# Patient Record
Sex: Male | Born: 1950 | Race: Black or African American | Hispanic: No | Smoking: Current some day smoker
Health system: Southern US, Community
[De-identification: ages and names within clinical notes are randomized; demographics above are authoritative.]

## PROBLEM LIST (undated history)

## (undated) DIAGNOSIS — I63231 Cerebral infarction due to unspecified occlusion or stenosis of right carotid arteries: Secondary | ICD-10-CM

## (undated) DIAGNOSIS — I4891 Unspecified atrial fibrillation: Secondary | ICD-10-CM

## (undated) DIAGNOSIS — Z9289 Personal history of other medical treatment: Secondary | ICD-10-CM

## (undated) DIAGNOSIS — I10 Essential (primary) hypertension: Secondary | ICD-10-CM

## (undated) DIAGNOSIS — Z7901 Long term (current) use of anticoagulants: Secondary | ICD-10-CM

## (undated) DIAGNOSIS — I509 Heart failure, unspecified: Secondary | ICD-10-CM

## (undated) DIAGNOSIS — I428 Other cardiomyopathies: Secondary | ICD-10-CM

## (undated) DIAGNOSIS — K279 Peptic ulcer, site unspecified, unspecified as acute or chronic, without hemorrhage or perforation: Secondary | ICD-10-CM

## (undated) DIAGNOSIS — I639 Cerebral infarction, unspecified: Secondary | ICD-10-CM

## (undated) DIAGNOSIS — E785 Hyperlipidemia, unspecified: Secondary | ICD-10-CM

## (undated) DIAGNOSIS — I63411 Cerebral infarction due to embolism of right middle cerebral artery: Secondary | ICD-10-CM

## (undated) HISTORY — DX: Personal history of other medical treatment: Z92.89

## (undated) HISTORY — DX: Long term (current) use of anticoagulants: Z79.01

## (undated) HISTORY — PX: REPAIR OF PERFORATED ULCER: SHX6065

## (undated) HISTORY — DX: Heart failure, unspecified: I50.9

## (undated) HISTORY — DX: Cerebral infarction, unspecified: I63.9

## (undated) HISTORY — DX: Cerebral infarction due to embolism of right middle cerebral artery: I63.411

## (undated) HISTORY — DX: Other cardiomyopathies: I42.8

## (undated) HISTORY — DX: Hyperlipidemia, unspecified: E78.5

## (undated) HISTORY — DX: Unspecified atrial fibrillation: I48.91

## (undated) HISTORY — DX: Cerebral infarction due to unspecified occlusion or stenosis of right carotid arteries: I63.231

---

## 1986-02-18 DIAGNOSIS — K279 Peptic ulcer, site unspecified, unspecified as acute or chronic, without hemorrhage or perforation: Secondary | ICD-10-CM

## 1986-02-18 HISTORY — DX: Peptic ulcer, site unspecified, unspecified as acute or chronic, without hemorrhage or perforation: K27.9

## 2014-07-20 DIAGNOSIS — I4891 Unspecified atrial fibrillation: Secondary | ICD-10-CM

## 2014-07-20 HISTORY — DX: Unspecified atrial fibrillation: I48.91

## 2014-08-10 ENCOUNTER — Emergency Department (HOSPITAL_COMMUNITY)
Admission: EM | Admit: 2014-08-10 | Discharge: 2014-08-10 | Disposition: A | Discharge status: Home / Self Care | Payer: Self-pay | Attending: Emergency Medicine | Admitting: Emergency Medicine

## 2014-08-10 ENCOUNTER — Encounter (HOSPITAL_COMMUNITY): Payer: Self-pay

## 2014-08-10 DIAGNOSIS — R404 Transient alteration of awareness: Secondary | ICD-10-CM

## 2014-08-10 DIAGNOSIS — Z720 Tobacco use: Secondary | ICD-10-CM | POA: Insufficient documentation

## 2014-08-10 DIAGNOSIS — Z79899 Other long term (current) drug therapy: Secondary | ICD-10-CM | POA: Insufficient documentation

## 2014-08-10 DIAGNOSIS — Z8711 Personal history of peptic ulcer disease: Secondary | ICD-10-CM | POA: Insufficient documentation

## 2014-08-10 DIAGNOSIS — I4891 Unspecified atrial fibrillation: Secondary | ICD-10-CM

## 2014-08-10 HISTORY — DX: Peptic ulcer, site unspecified, unspecified as acute or chronic, without hemorrhage or perforation: K27.9

## 2014-08-10 LAB — CBC WITH DIFFERENTIAL/PLATELET
Basophils Absolute: 0 10*3/uL (ref 0.0–0.1)
Basophils Relative: 0 % (ref 0–1)
EOS ABS: 0 10*3/uL (ref 0.0–0.7)
EOS PCT: 0 % (ref 0–5)
HCT: 44.8 % (ref 39.0–52.0)
HEMOGLOBIN: 15.3 g/dL (ref 13.0–17.0)
Lymphocytes Relative: 29 % (ref 12–46)
Lymphs Abs: 1.5 10*3/uL (ref 0.7–4.0)
MCH: 29.7 pg (ref 26.0–34.0)
MCHC: 34.2 g/dL (ref 30.0–36.0)
MCV: 87 fL (ref 78.0–100.0)
MONO ABS: 0.2 10*3/uL (ref 0.1–1.0)
Monocytes Relative: 5 % (ref 3–12)
Neutro Abs: 3.5 10*3/uL (ref 1.7–7.7)
Neutrophils Relative %: 66 % (ref 43–77)
Platelets: 141 10*3/uL — ABNORMAL LOW (ref 150–400)
RBC: 5.15 MIL/uL (ref 4.22–5.81)
RDW: 14.6 % (ref 11.5–15.5)
WBC: 5.2 10*3/uL (ref 4.0–10.5)

## 2014-08-10 LAB — COMPREHENSIVE METABOLIC PANEL
ALT: 13 U/L — ABNORMAL LOW (ref 17–63)
AST: 20 U/L (ref 15–41)
Albumin: 3.3 g/dL — ABNORMAL LOW (ref 3.5–5.0)
Alkaline Phosphatase: 81 U/L (ref 38–126)
Anion gap: 9 (ref 5–15)
BUN: 17 mg/dL (ref 6–20)
CHLORIDE: 107 mmol/L (ref 101–111)
CO2: 21 mmol/L — ABNORMAL LOW (ref 22–32)
CREATININE: 1.54 mg/dL — AB (ref 0.61–1.24)
Calcium: 8.5 mg/dL — ABNORMAL LOW (ref 8.9–10.3)
GFR calc Af Amer: 54 mL/min — ABNORMAL LOW (ref >60)
GFR calc non Af Amer: 47 mL/min — ABNORMAL LOW (ref >60)
GLUCOSE: 99 mg/dL (ref 65–99)
Potassium: 4.4 mmol/L (ref 3.5–5.1)
Sodium: 137 mmol/L (ref 135–145)
Total Bilirubin: 0.6 mg/dL (ref 0.3–1.2)
Total Protein: 6.4 g/dL — ABNORMAL LOW (ref 6.5–8.1)

## 2014-08-10 LAB — ETHANOL: Alcohol, Ethyl (B): 72 mg/dL — ABNORMAL HIGH (ref <5)

## 2014-08-10 LAB — CBG MONITORING, ED: Glucose-Capillary: 114 mg/dL — ABNORMAL HIGH (ref 65–99)

## 2014-08-10 MED ORDER — ASPIRIN 81 MG PO CHEW
324.0000 mg | CHEWABLE_TABLET | Freq: Every day | ORAL | Status: DC
Start: 2014-08-10 — End: 2014-09-10

## 2014-08-10 MED ORDER — ASPIRIN 81 MG PO CHEW
324.0000 mg | CHEWABLE_TABLET | Freq: Once | ORAL | Status: AC
  Administered 2014-08-10: 324 mg via ORAL
  Filled 2014-08-10: qty 4

## 2014-08-10 MED ORDER — SODIUM CHLORIDE 0.9 % IV BOLUS (SEPSIS)
1000.0000 mL | Freq: Once | INTRAVENOUS | Status: AC
  Administered 2014-08-10: 1000 mL via INTRAVENOUS

## 2014-08-10 NOTE — Discharge Instructions (Signed)
As discussed, it is important that you follow up as soon as possible with your physician for continued management of your condition.  If you develop any new, or concerning changes in your condition, please return to the emergency department immediately.    Atrial Fibrillation Atrial fibrillation is a condition that causes your heart to beat irregularly. It may also cause your heart to beat faster than normal. Atrial fibrillation can prevent your heart from pumping blood normally. It increases your risk of stroke and heart problems. HOME CARE  Take medications as told by your doctor.  Only take medications that your doctor says are safe. Some medications can make the condition worse or happen again.  If blood thinners were prescribed by your doctor, take them exactly as told. Too much can cause bleeding. Too little and you will not have the needed protection against stroke and other problems.  Perform blood tests at home if told by your doctor.  Perform blood tests exactly as told by your doctor.  Do not drink alcohol.  Do not drink beverages with caffeine such as coffee, soda, and some teas.  Maintain a healthy weight.  Do not use diet pills unless your doctor says they are safe. They may make heart problems worse.  Follow diet instructions as told by your doctor.  Exercise regularly as told by your doctor.  Keep all follow-up appointments. GET HELP IF:  You notice a change in the speed, rhythm, or strength of your heartbeat.  You suddenly begin peeing (urinating) more often.  You get tired more easily when moving or exercising. GET HELP RIGHT AWAY IF:   You have chest or belly (abdominal) pain.  You feel sick to your stomach (nauseous).  You are short of breath.  You suddenly have swollen feet and ankles.  You feel dizzy.  You face, arms, or legs feel numb or weak.  There is a change in your vision or speech. MAKE SURE YOU:   Understand these  instructions.  Will watch your condition.  Will get help right away if you are not doing well or get worse. Document Released: 11/14/2007 Document Revised: 06/21/2013 Document Reviewed: 03/17/2012 [facility] Patient Information 2015 [place], [place]. This information is not intended to replace advice given to you by your health care provider. Make sure you discuss any questions you have with your health care provider.

## 2014-08-10 NOTE — ED Notes (Signed)
[facility]- pt coming from home with altered mental status. Pt reportedly consumed 2 beers and smoked weed. Pt found to be very hypotensive on EMS arrival. No radial pulses present. Pt alert and pleasant on arrival but unable to answer simple questions correctly. Vitals stable at this time.

## 2014-08-10 NOTE — ED Provider Notes (Signed)
CSN: [id number]     Arrival date & time 08/10/14  1722 History   First MD Initiated Contact with Patient 08/10/14 1720     Chief Complaint  Patient presents with  . Altered Mental Status     HPI  Patient presents from home after an episode of altered mental status, possible syncope. Patient acknowledges drinking alcohol, smoking marijuana, then having episode of decreased interactivity. EMS reports that the patient was hypotensive on arrival, but interacting appropriately. Patient denies pain, current lightheadedness, nausea, chest pain. He states that he is generally well, though he smokes cigarettes. He has no history of coronary disease. He was well prior to the episode, has no similar prior episodes.    Past Medical History  Diagnosis Date  . Peptic ulcer    Past Surgical History  Procedure Laterality Date  . Repair of perforated ulcer     History reviewed. No pertinent family history. History  Substance Use Topics  . Smoking status: Current Every Day Smoker -- 0.30 packs/day  . Smokeless tobacco: Not on file  . Alcohol Use: 7.2 oz/week    12 Cans of beer per week    Review of Systems  Constitutional:       Per HPI, otherwise negative  HENT:       Per HPI, otherwise negative  Respiratory:       Per HPI, otherwise negative  Cardiovascular:       Per HPI, otherwise negative  Gastrointestinal: Negative for vomiting.  Endocrine:       Negative aside from HPI  Genitourinary:       Neg aside from HPI   Musculoskeletal:       Per HPI, otherwise negative  Skin: Negative.   Neurological: Positive for syncope.      Allergies  Review of patient's allergies indicates no known allergies.  Home Medications   Prior to Admission medications   Medication Sig Start Date End Date Taking? Authorizing Provider  Aspirin-Salicylamide-Caffeine (BC HEADACHE POWDER PO) Take 1 Package by mouth daily as needed (pain).   Yes Historical Provider, MD  Multiple  Vitamins-Minerals (MULTIVITAMIN & MINERAL PO) Take 1 tablet by mouth daily.   Yes Historical Provider, MD  aspirin 81 MG chewable tablet Chew 4 tablets (324 mg total) by mouth daily. 08/10/14   Gerhard Munch, MD   BP 132/94 mmHg  Pulse 91  Temp(Src) 97.7 F (36.5 C) (Oral)  Resp 14  Ht 5\' 8"  (1.727 m)  Wt 162 lb 4.8 oz (73.619 kg)  BMI 24.68 kg/m2  SpO2 100% Physical Exam  Constitutional: He is oriented to person, place, and time. He appears well-developed. No distress.  HENT:  Head: Normocephalic and atraumatic.  Eyes: Conjunctivae and EOM are normal.  Cardiovascular: An irregularly irregular rhythm present.  Pulmonary/Chest: Effort normal. No stridor. No respiratory distress.  Abdominal: He exhibits no distension.  Musculoskeletal: He exhibits no edema.  Neurological: He is alert and oriented to person, place, and time.  Skin: Skin is warm and dry.  Psychiatric: He has a normal mood and affect.  Nursing note and vitals reviewed.   ED Course  Procedures (including critical care time) Labs Review Labs Reviewed  COMPREHENSIVE METABOLIC PANEL - Abnormal; Notable for the following:    CO2 21 (*)    Creatinine, Ser 1.54 (*)    Calcium 8.5 (*)    Total Protein 6.4 (*)    Albumin 3.3 (*)    ALT 13 (*)    GFR calc non Af  Amer 47 (*)    GFR calc Af Amer 54 (*)    All other components within normal limits  CBC WITH DIFFERENTIAL/PLATELET - Abnormal; Notable for the following:    Platelets 141 (*)    All other components within normal limits  ETHANOL - Abnormal; Notable for the following:    Alcohol, Ethyl (B) 72 (*)    All other components within normal limits  CBG MONITORING, ED - Abnormal; Notable for the following:    Glucose-Capillary 114 (*)    All other components within normal limits  URINALYSIS, ROUTINE W REFLEX MICROSCOPIC (NOT AT [facility])  URINE RAPID DRUG SCREEN, HOSP PERFORMED      EKG Interpretation   Date/Time:  Wednesday August 10 2014 17:26:20  EDT Ventricular Rate:  90 PR Interval:    QRS Duration: 104 QT Interval:  369 QTC Calculation: 451 R Axis:   5 Text Interpretation:  Atrial fibrillation Abnormal R-wave progression,  early transition Abnrm T, consider ischemia, anterolateral lds Minimal ST  elevation, anterior leads Atrial fibrillation Left ventricular hypertrophy  T wave abnormality Abnormal ekg Confirmed by Gerhard Munch  MD [phone])  on 08/10/2014 7:15:53 PM     On repeat exam the patient appears well. A family members now present. He states that the patient had, as described, an episode of decreased interactivity for several moments, without trauma, and with full return to interactivity.  The patient had a lengthy conversation about new atrial fibrillation, the need to follow-up closely with our cardiology colleagues tomorrow, need to take aspirin empirically for stroke prevention pending cardiology follow-up. Patient has remained with no tachycardia, but with persistent atrial fibrillation all in the emergency department.  MDM   Final diagnoses:  Transient alteration of awareness  Atrial fibrillation, new onset   Patient presents after an episode of likely syncope, possible decreased interactivity. Here patient is awake, alert, answering questions appropriately, in no distress. Number, the patient was using both marijuana and alcohol prior to the episode, and each of these may have precipitated the episode of atrial fibrillation. Patient has no history of arrhythmia. Patient's labs today otherwise are reassuring, aside from mild borderline kidney function. After hours of monitoring, with no decompensation, no return event, patient was discharged in stable condition with next day cardiology follow-up after initiation of antiplatelets therapy.  Gerhard Munch, MD 08/10/14 [phone]

## 2014-08-10 NOTE — ED Notes (Signed)
OK to DC without collection of UA or UDS.

## 2014-08-11 ENCOUNTER — Ambulatory Visit (HOSPITAL_COMMUNITY)
Admission: RE | Admit: 2014-08-11 | Discharge: 2014-08-11 | Disposition: A | Discharge status: Home / Self Care | Payer: Self-pay | Source: Ambulatory Visit | Attending: Nurse Practitioner | Admitting: Nurse Practitioner

## 2014-08-11 ENCOUNTER — Encounter (HOSPITAL_COMMUNITY): Payer: Self-pay | Admitting: Nurse Practitioner

## 2014-08-11 VITALS — BP 160/100 | HR 98 | Ht 68.0 in | Wt 163.0 lb

## 2014-08-11 DIAGNOSIS — F17210 Nicotine dependence, cigarettes, uncomplicated: Secondary | ICD-10-CM | POA: Insufficient documentation

## 2014-08-11 DIAGNOSIS — I4510 Unspecified right bundle-branch block: Secondary | ICD-10-CM | POA: Insufficient documentation

## 2014-08-11 DIAGNOSIS — I10 Essential (primary) hypertension: Secondary | ICD-10-CM | POA: Insufficient documentation

## 2014-08-11 DIAGNOSIS — I4891 Unspecified atrial fibrillation: Secondary | ICD-10-CM | POA: Insufficient documentation

## 2014-08-11 DIAGNOSIS — Z7982 Long term (current) use of aspirin: Secondary | ICD-10-CM | POA: Insufficient documentation

## 2014-08-11 DIAGNOSIS — I480 Paroxysmal atrial fibrillation: Secondary | ICD-10-CM

## 2014-08-11 NOTE — Progress Notes (Signed)
Patient ID: Jeffrey Martin, male   DOB: [birth year], 64 y.o.   MRN: [id number]  Primary Care Physician: No PCP Per Patient Referring Physician:[facility]   Jeffrey Martin is a 64 y.o. male with a h/o [facility] ER visit last night after episode of altered mental status possibly contributed to  by alcohol use and smoking marijuana. He was found to be in rate controlled, presumably  new onset  afib. He was started on baby ASA and referred here for f/u after no significant abnormal findings and return  of mental status to normal. Chadsvasc score is 0-1 if based on today's high BP reading.  EKG today shows rate controlled afib. Pt is asymptomatic. He is not well known to the medical community having last gone to the doctor one year ago. BP was acceptable  in the ER last pm at 132/94, but it is high today. Repeat BP here 160/100. He states BP usually is not an issue, he is anxious today. He states he only drinks a few beers a week but ER note states pt drinks around 12 beers a week. He does also smoke but does not consume large amounts of caffeine. He denies smoking marijuana. Denies a snoring history. Denies ever having an echocardiogram.  Today, he denies symptoms of palpitations, chest pain, shortness of breath, orthopnea, PND, lower extremity edema, dizziness, presyncope, syncope, or neurologic sequela. The patient is tolerating medications without difficulties and is otherwise without complaint today.   Past Medical History  Diagnosis Date  . Peptic ulcer    Past Surgical History  Procedure Laterality Date  . Repair of perforated ulcer      Current Outpatient Prescriptions  Medication Sig Dispense Refill  . aspirin 81 MG chewable tablet Chew 4 tablets (324 mg total) by mouth daily. 120 tablet 0  . Aspirin-Salicylamide-Caffeine (BC HEADACHE POWDER PO) Take 1 Package by mouth daily as needed (pain).    . Multiple Vitamins-Minerals (MULTIVITAMIN & MINERAL PO) Take 1 tablet by mouth daily.     No  current facility-administered medications for this encounter.    No Known Allergies  History   Social History  . Marital Status: Legally Separated    Spouse Name: N/A  . Number of Children: N/A  . Years of Education: N/A   Occupational History  . Not on file.   Social History Main Topics  . Smoking status: Current Every Day Smoker -- 0.30 packs/day  . Smokeless tobacco: Not on file  . Alcohol Use: 7.2 oz/week    12 Cans of beer per week  . Drug Use: Yes    Special: Marijuana  . Sexual Activity: Not on file   Other Topics Concern  . Not on file   Social History Narrative    No family history on file.  ROS- All systems are reviewed and negative except as per the HPI above  Physical Exam: Filed Vitals:   08/11/14 1339 08/11/14 1514  BP: 188/120 160/100  Pulse: 98   Height: 5\' 8"  (1.727 m)   Weight: 163 lb (73.936 kg)     GEN- The patient is well appearing, alert and oriented x 3 today.   Head- normocephalic, atraumatic Eyes-  Sclera clear, conjunctiva pink Ears- hearing intact Oropharynx- clear Neck- supple, no JVP Lymph- no cervical lymphadenopathy Lungs- Clear to ausculation bilaterally, normal work of breathing Heart- Regular rate and rhythm, no murmurs, rubs or gallops, PMI not laterally displaced GI- soft, NT, ND, + BS Extremities- no clubbing, cyanosis, or edema MS-  no significant deformity or atrophy Skin- no rash or lesion Psych- euthymic mood, full affect Neuro- strength and sensation are intact  EKG- Afib with premature or aberrantly conducted complexes PR int 98 bpm,QRS int 104 ms, Qtc 457 ms  Epic records reviewed.   Assessment and Plan:  1. New onset asymptomatic afib, rate controlled  No rate control needed for now Chadsvasc score of 0-1 Continue ASA. Echo to be scheduled for Monday  2. Elevated BP today Pt states he has never had the dx of HTN, but he has had  sporadic  Medical care Will continue to monitor BP in subsequent  visits and start med as needed Avoid salt  3. Lifestyle issues contributing to afib burden Decrease /stop smoking  Decrease/stop alcohol consumption  F/u after echo for further treatment.  Consider holter monitor or return visit. Get established with general cardiology long term

## 2014-08-11 NOTE — Patient Instructions (Addendum)
Your physician has requested that you have an echocardiogram. Echocardiography is a painless test that uses sound waves to create images of your heart. It provides your doctor with information about the size and shape of your heart and how well your heart's chambers and valves are working. This procedure takes approximately one hour. There are no restrictions for this procedure.  Parking code [id number] for echo  Follow up appointment with Rudi Coco, NP parking code is [id number]

## 2014-08-15 ENCOUNTER — Ambulatory Visit (HOSPITAL_COMMUNITY)
Admission: RE | Admit: 2014-08-15 | Discharge: 2014-08-15 | Disposition: A | Discharge status: Home / Self Care | Payer: Self-pay | Source: Ambulatory Visit | Attending: Nurse Practitioner | Admitting: Nurse Practitioner

## 2014-08-15 DIAGNOSIS — I480 Paroxysmal atrial fibrillation: Secondary | ICD-10-CM

## 2014-08-15 DIAGNOSIS — I4891 Unspecified atrial fibrillation: Secondary | ICD-10-CM | POA: Insufficient documentation

## 2014-08-15 NOTE — Progress Notes (Signed)
  Echocardiogram 2D Echocardiogram has been performed.  Arvil Chaco 08/15/2014, 3:22 PM

## 2014-08-25 ENCOUNTER — Other Ambulatory Visit (HOSPITAL_COMMUNITY): Payer: Self-pay | Admitting: *Deleted

## 2014-08-25 ENCOUNTER — Ambulatory Visit (HOSPITAL_COMMUNITY)
Admission: RE | Admit: 2014-08-25 | Discharge: 2014-08-25 | Disposition: A | Discharge status: Home / Self Care | Payer: Medicaid Other | Source: Ambulatory Visit | Attending: Nurse Practitioner | Admitting: Nurse Practitioner

## 2014-08-25 ENCOUNTER — Encounter (HOSPITAL_COMMUNITY): Payer: Self-pay | Admitting: Nurse Practitioner

## 2014-08-25 VITALS — BP 138/80 | HR 105 | Ht 68.0 in | Wt 156.2 lb

## 2014-08-25 DIAGNOSIS — I480 Paroxysmal atrial fibrillation: Secondary | ICD-10-CM | POA: Diagnosis not present

## 2014-08-25 DIAGNOSIS — I4510 Unspecified right bundle-branch block: Secondary | ICD-10-CM | POA: Diagnosis not present

## 2014-08-25 DIAGNOSIS — I4819 Other persistent atrial fibrillation: Secondary | ICD-10-CM

## 2014-08-25 DIAGNOSIS — I481 Persistent atrial fibrillation: Secondary | ICD-10-CM | POA: Diagnosis not present

## 2014-08-25 DIAGNOSIS — I4891 Unspecified atrial fibrillation: Secondary | ICD-10-CM | POA: Diagnosis present

## 2014-08-25 MED ORDER — METOPROLOL SUCCINATE ER 25 MG PO TB24: 25.0000 mg | ORAL_TABLET | Freq: Every day | ORAL | Status: DC

## 2014-08-25 NOTE — Patient Instructions (Signed)
Your physician has recommended you make the following change in your medication:  1)Metoprolol ER (toprol) 25mg  once a day   I will call you regarding appointment to [facility] wellness center to establish care with them.  Their number is [phone]

## 2014-08-25 NOTE — Progress Notes (Signed)
Patient ID: Jeffrey Martin, male   DOB: November 11, 1950, 64 y.o.   MRN: [id number]  Primary Care Physician: No PCP Per Patient Referring Physician:[facility]   Josue Falconi is a 64 y.o. male with a h/o [facility] ER 6/22 after episode of altered mental status possibly contributed to  by alcohol use and smoking marijuana. He was found to be in rate controlled, presumably  new onset  afib. He was started on baby ASA and referred here for f/u after no significant abnormal findings and return  of mental status to normal. Chadsvasc score is 0-1 if based on today's high BP reading.  EKG today shows rate controlled afib. Pt is asymptomatic. He is not well known to the medical community having last gone to the doctor one year ago. BP was acceptable  in the ER last pm at 132/94.  He states BP usually is not an issue.  He states he only drinks a few beers a week but ER note states pt drinks around 12 beers a week. He does also smoke but does not consume large amounts of caffeine. He denies smoking marijuana,but per ER record he does use marijuana. Denies a snoring history. Denies ever having a cardiac issue or work up.  An echo was ordered when last seen in the afib clinic, and he is back for f/u. It did show moderate LV dysfunction with an EF of 35-40%. He is in afib at 105 bpm,but he feels well and is not aware of palpitations. States he walks 1/2 to 1 mile to the bus stop daily and can do so with good energy and no dyspnea. Has had no further presyncopal episodes. He currently does not have a job or insurance but does have an appointment on Monday to see if he qualifies for medicaid.  Today, he denies symptoms of palpitations, chest pain, shortness of breath, orthopnea, PND, lower extremity edema, dizziness, presyncope, syncope, or neurologic sequela. The patient is tolerating medications without difficulties and is otherwise without complaint today.   Past Medical History  Diagnosis Date  . Peptic ulcer    Past  Surgical History  Procedure Laterality Date  . Repair of perforated ulcer      Current Outpatient Prescriptions  Medication Sig Dispense Refill  . aspirin 81 MG chewable tablet Chew 4 tablets (324 mg total) by mouth daily. 120 tablet 0  . Aspirin-Salicylamide-Caffeine (BC HEADACHE POWDER PO) Take 1 Package by mouth daily as needed (pain).    . Multiple Vitamins-Minerals (MULTIVITAMIN & MINERAL PO) Take 1 tablet by mouth daily.    . metoprolol succinate (TOPROL-XL) 25 MG 24 hr tablet Take 1 tablet (25 mg total) by mouth daily. 30 tablet 6   No current facility-administered medications for this encounter.    No Known Allergies  History   Social History  . Marital Status: Legally Separated    Spouse Name: N/A  . Number of Children: N/A  . Years of Education: N/A   Occupational History  . Not on file.   Social History Main Topics  . Smoking status: Current Every Day Smoker -- 0.30 packs/day  . Smokeless tobacco: Not on file  . Alcohol Use: 7.2 oz/week    12 Cans of beer per week  . Drug Use: Yes    Special: Marijuana  . Sexual Activity: Not on file   Other Topics Concern  . Not on file   Social History Narrative    No family history on file.  ROS- All systems are reviewed  and negative except as per the HPI above  Physical Exam: Filed Vitals:   08/25/14 1309 08/25/14 1421  BP: 140/98 138/80  Pulse: 105   Height: 5\' 8"  (1.727 m)   Weight: 156 lb 3.2 oz (70.852 kg)     GEN- The patient is well appearing, alert and oriented x 3 today.   Head- normocephalic, atraumatic Eyes-  Sclera clear, conjunctiva pink Ears- hearing intact Oropharynx- clear Neck- supple, no JVP Lymph- no cervical lymphadenopathy Lungs- Clear to ausculation bilaterally, normal work of breathing Heart- Irregular rate and rhythm, no murmurs, rubs or gallops, PMI not laterally displaced GI- soft, NT, ND, + BS Extremities- no clubbing, cyanosis, or edema MS- no significant deformity or  atrophy Skin- no rash or lesion Psych- euthymic mood, full affect Neuro- strength and sensation are intact  EKG- Afib with RVR at 105 bpm, IRBBB,  QRS int 102 ms, Qtc 436 ms  Epic records reviewed.   Assessment and Plan:  1.  Asymptomatic afib, v rate today elevated at 105 bpm Chadsvasc score of 0-1 Continue ASA. Start metoprolol ER 25 mg a day for rate control  2. BP Normal on repeat check  3. Lifestyle issues contributing to afib burden Decrease /stop smoking  Decrease/stop alcohol consumption  4. LV dysfunction Start metoprolol ER 25 mg a day Consider staring ace in near future Consider stress test in the future if he can get assitance. cuurently no chest pain or dyspnea  5. Will try to refer to the Wellness clinic to help him obtain medicines and other medical care. If he can not be seen there in timely fashion, will see him back in two weeks to see adjustment to BB.

## 2014-09-06 ENCOUNTER — Encounter (HOSPITAL_COMMUNITY): Payer: Self-pay | Admitting: Emergency Medicine

## 2014-09-06 ENCOUNTER — Emergency Department (HOSPITAL_COMMUNITY): Payer: Medicaid Other

## 2014-09-06 ENCOUNTER — Inpatient Hospital Stay (HOSPITAL_COMMUNITY)
Admission: EM | Admit: 2014-09-06 | Discharge: 2014-09-10 | DRG: 065 | Disposition: A | Discharge status: Home Health | Payer: Medicaid Other | Attending: Internal Medicine | Admitting: Internal Medicine

## 2014-09-06 DIAGNOSIS — I509 Heart failure, unspecified: Secondary | ICD-10-CM | POA: Diagnosis present

## 2014-09-06 DIAGNOSIS — I639 Cerebral infarction, unspecified: Secondary | ICD-10-CM | POA: Insufficient documentation

## 2014-09-06 DIAGNOSIS — F17200 Nicotine dependence, unspecified, uncomplicated: Secondary | ICD-10-CM | POA: Insufficient documentation

## 2014-09-06 DIAGNOSIS — G459 Transient cerebral ischemic attack, unspecified: Secondary | ICD-10-CM | POA: Diagnosis present

## 2014-09-06 DIAGNOSIS — I63411 Cerebral infarction due to embolism of right middle cerebral artery: Principal | ICD-10-CM | POA: Insufficient documentation

## 2014-09-06 DIAGNOSIS — I6521 Occlusion and stenosis of right carotid artery: Secondary | ICD-10-CM | POA: Diagnosis present

## 2014-09-06 DIAGNOSIS — Z833 Family history of diabetes mellitus: Secondary | ICD-10-CM

## 2014-09-06 DIAGNOSIS — R4189 Other symptoms and signs involving cognitive functions and awareness: Secondary | ICD-10-CM | POA: Diagnosis present

## 2014-09-06 DIAGNOSIS — E875 Hyperkalemia: Secondary | ICD-10-CM | POA: Diagnosis present

## 2014-09-06 DIAGNOSIS — Z8249 Family history of ischemic heart disease and other diseases of the circulatory system: Secondary | ICD-10-CM

## 2014-09-06 DIAGNOSIS — R748 Abnormal levels of other serum enzymes: Secondary | ICD-10-CM | POA: Diagnosis present

## 2014-09-06 DIAGNOSIS — Z8711 Personal history of peptic ulcer disease: Secondary | ICD-10-CM

## 2014-09-06 DIAGNOSIS — I255 Ischemic cardiomyopathy: Secondary | ICD-10-CM | POA: Insufficient documentation

## 2014-09-06 DIAGNOSIS — Z79899 Other long term (current) drug therapy: Secondary | ICD-10-CM

## 2014-09-06 DIAGNOSIS — I129 Hypertensive chronic kidney disease with stage 1 through stage 4 chronic kidney disease, or unspecified chronic kidney disease: Secondary | ICD-10-CM | POA: Diagnosis present

## 2014-09-06 DIAGNOSIS — F17210 Nicotine dependence, cigarettes, uncomplicated: Secondary | ICD-10-CM | POA: Diagnosis present

## 2014-09-06 DIAGNOSIS — N182 Chronic kidney disease, stage 2 (mild): Secondary | ICD-10-CM | POA: Diagnosis present

## 2014-09-06 DIAGNOSIS — I63412 Cerebral infarction due to embolism of left middle cerebral artery: Secondary | ICD-10-CM | POA: Diagnosis present

## 2014-09-06 DIAGNOSIS — I10 Essential (primary) hypertension: Secondary | ICD-10-CM | POA: Insufficient documentation

## 2014-09-06 DIAGNOSIS — R471 Dysarthria and anarthria: Secondary | ICD-10-CM | POA: Diagnosis present

## 2014-09-06 DIAGNOSIS — E785 Hyperlipidemia, unspecified: Secondary | ICD-10-CM | POA: Insufficient documentation

## 2014-09-06 DIAGNOSIS — Z7982 Long term (current) use of aspirin: Secondary | ICD-10-CM

## 2014-09-06 DIAGNOSIS — I4891 Unspecified atrial fibrillation: Secondary | ICD-10-CM | POA: Diagnosis present

## 2014-09-06 DIAGNOSIS — G8192 Hemiplegia, unspecified affecting left dominant side: Secondary | ICD-10-CM | POA: Diagnosis present

## 2014-09-06 HISTORY — DX: Essential (primary) hypertension: I10

## 2014-09-06 LAB — CBC
HCT: 50.3 % (ref 39.0–52.0)
Hemoglobin: 16.8 g/dL (ref 13.0–17.0)
MCH: 29.7 pg (ref 26.0–34.0)
MCHC: 33.4 g/dL (ref 30.0–36.0)
MCV: 88.9 fL (ref 78.0–100.0)
Platelets: 188 10*3/uL (ref 150–400)
RBC: 5.66 MIL/uL (ref 4.22–5.81)
RDW: 14.7 % (ref 11.5–15.5)
WBC: 6.8 10*3/uL (ref 4.0–10.5)

## 2014-09-06 LAB — DIFFERENTIAL
BASOS PCT: 0 % (ref 0–1)
Basophils Absolute: 0 10*3/uL (ref 0.0–0.1)
EOS ABS: 0 10*3/uL (ref 0.0–0.7)
Eosinophils Relative: 1 % (ref 0–5)
Lymphocytes Relative: 40 % (ref 12–46)
Lymphs Abs: 2.7 10*3/uL (ref 0.7–4.0)
Monocytes Absolute: 0.7 10*3/uL (ref 0.1–1.0)
Monocytes Relative: 10 % (ref 3–12)
Neutro Abs: 3.3 10*3/uL (ref 1.7–7.7)
Neutrophils Relative %: 49 % (ref 43–77)

## 2014-09-06 LAB — CBG MONITORING, ED: GLUCOSE-CAPILLARY: 103 mg/dL — AB (ref 65–99)

## 2014-09-06 LAB — I-STAT TROPONIN, ED: TROPONIN I, POC: 0.22 ng/mL — AB (ref 0.00–0.08)

## 2014-09-06 MED ORDER — SODIUM CHLORIDE 0.9 % IV BOLUS (SEPSIS)
500.0000 mL | Freq: Once | INTRAVENOUS | Status: AC
  Administered 2014-09-08: 500 mL via INTRAVENOUS

## 2014-09-06 NOTE — ED Provider Notes (Signed)
CSN: [id number]     Arrival date & time 09/06/14  2217 History   First MD Initiated Contact with Patient 09/06/14 2304     Chief Complaint  Patient presents with  . Weakness     (Consider location/radiation/quality/duration/timing/severity/associated sxs/prior Treatment) Patient is a 64 y.o. male presenting with weakness. The history is provided by a relative. The history is limited by the condition of the patient.  Weakness This is a new problem. The current episode started 2 days ago. The problem occurs constantly. The problem has not changed since onset.Pertinent negatives include no chest pain. Nothing aggravates the symptoms. Nothing relieves the symptoms. He has tried nothing for the symptoms. The treatment provided no relief.    Past Medical History  Diagnosis Date  . Peptic ulcer   . Irregular heart beat    Past Surgical History  Procedure Laterality Date  . Repair of perforated ulcer     History reviewed. No pertinent family history. History  Substance Use Topics  . Smoking status: Current Every Day Smoker -- 0.30 packs/day    Types: Cigarettes  . Smokeless tobacco: Not on file  . Alcohol Use: Yes     Comment: rare    Review of Systems  Unable to perform ROS Cardiovascular: Negative for chest pain.  Neurological: Positive for facial asymmetry and weakness.      Allergies  Review of patient's allergies indicates no known allergies.  Home Medications   Prior to Admission medications   Medication Sig Start Date End Date Taking? Authorizing Provider  aspirin 81 MG chewable tablet Chew 4 tablets (324 mg total) by mouth daily. 08/10/14  Yes Gerhard Munch, MD  Aspirin-Salicylamide-Caffeine (BC HEADACHE POWDER PO) Take 1 Package by mouth daily as needed (pain).   Yes Historical Provider, MD  metoprolol succinate (TOPROL-XL) 25 MG 24 hr tablet Take 1 tablet (25 mg total) by mouth daily. 08/25/14  Yes Newman Nip, NP  Multiple Vitamins-Minerals (MULTIVITAMIN &  MINERAL PO) Take 1 tablet by mouth daily.   Yes Historical Provider, MD   BP 181/102 mmHg  Pulse 36  Resp 23  SpO2 100% Physical Exam  Constitutional: He appears well-developed and well-nourished. No distress.  HENT:  Head: Normocephalic and atraumatic.  Mouth/Throat: Oropharynx is clear and moist.  Eyes: Conjunctivae and EOM are normal. Pupils are equal, round, and reactive to light.  Neck: Normal range of motion. Neck supple. No tracheal deviation present.  Cardiovascular: Intact distal pulses.  An irregularly irregular rhythm present.  Pulmonary/Chest: Effort normal and breath sounds normal. No respiratory distress. He has no wheezes. He has no rales.  Abdominal: Soft. Bowel sounds are normal. There is no tenderness. There is no rebound and no guarding.  Musculoskeletal: Normal range of motion. He exhibits no edema or tenderness.  Neurological: He is alert. He has normal reflexes.  Skin: Skin is warm and dry.  Psychiatric: He has a normal mood and affect.    ED Course  Procedures (including critical care time) Labs Review Labs Reviewed  CBG MONITORING, ED - Abnormal; Notable for the following:    Glucose-Capillary 103 (*)    All other components within normal limits  I-STAT TROPOININ, ED - Abnormal; Notable for the following:    Troponin i, poc 0.22 (*)    All other components within normal limits  I-STAT CHEM 8, ED - Abnormal; Notable for the following:    Potassium 6.7 (*)    BUN 30 (*)    Creatinine, Ser 1.60 (*)  Hemoglobin 19.7 (*)    HCT 58.0 (*)    All other components within normal limits  CBC  DIFFERENTIAL  URINALYSIS, ROUTINE W REFLEX MICROSCOPIC (NOT AT [facility])  ACETAMINOPHEN LEVEL  SALICYLATE LEVEL  URINE RAPID DRUG SCREEN, HOSP PERFORMED  ETHANOL  COMPREHENSIVE METABOLIC PANEL  PROTIME-INR  APTT  CBG MONITORING, ED    Imaging Review No results found.   EKG Interpretation   Date/Time:  Tuesday September 06 2014 22:38:25 EDT Ventricular Rate:   109 PR Interval:    QRS Duration: 95 QT Interval:  342 QTC Calculation: 460 R Axis:   3 Text Interpretation:  Atrial fibrillation RSR' in V1 or V2, right VCD or  RVH Probable left ventricular hypertrophy Nonspecific T abnrm,  anterolateral leads No significant change since last tracing Confirmed by  Gwendolyn Grant  MD, BLAIR ([id number]) on 09/06/2014 11:27:01 PM      MDM   Final diagnoses:  None   Results for orders placed or performed during the hospital encounter of 09/06/14  CBC  Result Value Ref Range   WBC 6.8 4.0 - 10.5 K/uL   RBC 5.66 4.22 - 5.81 MIL/uL   Hemoglobin 16.8 13.0 - 17.0 g/dL   HCT 69.6 29.5 - 28.4 %   MCV 88.9 78.0 - 100.0 fL   MCH 29.7 26.0 - 34.0 pg   MCHC 33.4 30.0 - 36.0 g/dL   RDW 13.2 44.0 - 10.2 %   Platelets 188 150 - 400 K/uL  Differential  Result Value Ref Range   Neutrophils Relative % 49 43 - 77 %   Neutro Abs 3.3 1.7 - 7.7 K/uL   Lymphocytes Relative 40 12 - 46 %   Lymphs Abs 2.7 0.7 - 4.0 K/uL   Monocytes Relative 10 3 - 12 %   Monocytes Absolute 0.7 0.1 - 1.0 K/uL   Eosinophils Relative 1 0 - 5 %   Eosinophils Absolute 0.0 0.0 - 0.7 K/uL   Basophils Relative 0 0 - 1 %   Basophils Absolute 0.0 0.0 - 0.1 K/uL  Urinalysis, Routine w reflex microscopic (not at [facility])  Result Value Ref Range   Color, Urine YELLOW YELLOW   APPearance CLEAR CLEAR   Specific Gravity, Urine 1.019 1.005 - 1.030   pH 5.5 5.0 - 8.0   Glucose, UA NEGATIVE NEGATIVE mg/dL   Hgb urine dipstick NEGATIVE NEGATIVE   Bilirubin Urine NEGATIVE NEGATIVE   Ketones, ur NEGATIVE NEGATIVE mg/dL   Protein, ur NEGATIVE NEGATIVE mg/dL   Urobilinogen, UA 1.0 0.0 - 1.0 mg/dL   Nitrite NEGATIVE NEGATIVE   Leukocytes, UA SMALL (A) NEGATIVE  Acetaminophen level  Result Value Ref Range   Acetaminophen (Tylenol), Serum <10 (L) 10 - 30 ug/mL  Salicylate level  Result Value Ref Range   Salicylate Lvl <4.0 2.8 - 30.0 mg/dL  Urine rapid drug screen (hosp performed)  Result Value Ref Range    Opiates NONE DETECTED NONE DETECTED   Cocaine NONE DETECTED NONE DETECTED   Benzodiazepines NONE DETECTED NONE DETECTED   Amphetamines NONE DETECTED NONE DETECTED   Tetrahydrocannabinol NONE DETECTED NONE DETECTED   Barbiturates NONE DETECTED NONE DETECTED  Ethanol  Result Value Ref Range   Alcohol, Ethyl (B) <5 <5 mg/dL  Comprehensive metabolic panel  Result Value Ref Range   Sodium 141 135 - 145 mmol/L   Potassium 4.3 3.5 - 5.1 mmol/L   Chloride 108 101 - 111 mmol/L   CO2 24 22 - 32 mmol/L   Glucose, Bld 93 65 -  99 mg/dL   BUN 20 6 - 20 mg/dL   Creatinine, Ser 1.61 (H) 0.61 - 1.24 mg/dL   Calcium 9.5 8.9 - 09.6 mg/dL   Total Protein 7.7 6.5 - 8.1 g/dL   Albumin 3.9 3.5 - 5.0 g/dL   AST 23 15 - 41 U/L   ALT 17 17 - 63 U/L   Alkaline Phosphatase 94 38 - 126 U/L   Total Bilirubin 1.0 0.3 - 1.2 mg/dL   GFR calc non Af Amer 49 (L) >60 mL/min   GFR calc Af Amer 57 (L) >60 mL/min   Anion gap 9 5 - 15  Protime-INR  Result Value Ref Range   Prothrombin Time 15.0 11.6 - 15.2 seconds   INR 1.16 0.00 - 1.49  APTT  Result Value Ref Range   aPTT 30 24 - 37 seconds  Urine microscopic-add on  Result Value Ref Range   Squamous Epithelial / LPF RARE RARE   WBC, UA 7-10 <3 WBC/hpf   RBC / HPF 0-2 <3 RBC/hpf   Bacteria, UA FEW (A) RARE   Casts HYALINE CASTS (A) NEGATIVE  CBG monitoring, ED  Result Value Ref Range   Glucose-Capillary 103 (H) 65 - 99 mg/dL  I-stat troponin, ED (not at [facility])  Result Value Ref Range   Troponin i, poc 0.22 (HH) 0.00 - 0.08 ng/mL   Comment NOTIFIED PHYSICIAN    Comment 3          I-Stat Chem 8, ED  (not at [facility], [facility])  Result Value Ref Range   Sodium 139 135 - 145 mmol/L   Potassium 6.7 (HH) 3.5 - 5.1 mmol/L   Chloride 108 101 - 111 mmol/L   BUN 30 (H) 6 - 20 mg/dL   Creatinine, Ser 0.45 (H) 0.61 - 1.24 mg/dL   Glucose, Bld 99 65 - 99 mg/dL   Calcium, Ion 4.09 8.11 - 1.30 mmol/L   TCO2 24 0 - 100 mmol/L   Hemoglobin 19.7 (H) 13.0 - 17.0 g/dL    HCT 91.4 (H) 78.2 - 52.0 %   Comment NOTIFIED PHYSICIAN    Dg Chest 2 View  09/07/2014   CLINICAL DATA:  64 year old male with altered mental status  EXAM: CHEST  2 VIEW  COMPARISON:  None.  FINDINGS: The heart size and mediastinal contours are within normal limits. Both lungs are clear. The visualized skeletal structures are unremarkable.  Surgical clips noted over the epigastric area.  IMPRESSION: No active cardiopulmonary disease.   Electronically Signed   By: Elgie Collard M.D.   On: 09/07/2014 00:28   Ct Head Wo Contrast  09/07/2014   CLINICAL DATA:  64 year old male with slurred speech  EXAM: CT HEAD WITHOUT CONTRAST  TECHNIQUE: Contiguous axial images were obtained from the base of the skull through the vertex without intravenous contrast.  COMPARISON:  None.  FINDINGS: The ventricles are dilated and the sulci are prominent compatible with age-related atrophy. Periventricular and deep white matter hypodensities represent chronic microvascular ischemic changes. There is no intracranial hemorrhage. No mass effect or midline shift identified.  The visualized paranasal sinuses and mastoid air cells are well aerated. The calvarium is intact.  IMPRESSION: No acute intracranial pathology.  Age-related atrophy and chronic microvascular ischemic disease.  If symptoms persist and there are no contraindications, MRI may provide better evaluation if clinically indicated.   Electronically Signed   By: Elgie Collard M.D.   On: 09/07/2014 00:31     Seen by Dr. Hosie Poisson of  neuro who would like patient admitted to [facility] for CVA work up    Rimsha Trembley, MD 09/07/14 [id number]

## 2014-09-06 NOTE — ED Notes (Signed)
Pt family states pt has not been acting right  Pt has some facial droop on the left , weakness in the left arm, slurred speech with some aphasia noted  Family states sxs started on Monday

## 2014-09-06 NOTE — ED Notes (Signed)
Critical potassium value given to MD Palumbo, blood sent to main lab for confirmation.

## 2014-09-06 NOTE — ED Notes (Signed)
MD Palumbo is aware of pt critical troponin level.

## 2014-09-07 ENCOUNTER — Ambulatory Visit (HOSPITAL_COMMUNITY): Payer: Medicaid Other

## 2014-09-07 ENCOUNTER — Inpatient Hospital Stay (HOSPITAL_COMMUNITY): Payer: Medicaid Other

## 2014-09-07 ENCOUNTER — Encounter (HOSPITAL_COMMUNITY): Payer: Self-pay | Admitting: Emergency Medicine

## 2014-09-07 DIAGNOSIS — G459 Transient cerebral ischemic attack, unspecified: Secondary | ICD-10-CM | POA: Diagnosis not present

## 2014-09-07 DIAGNOSIS — I10 Essential (primary) hypertension: Secondary | ICD-10-CM | POA: Insufficient documentation

## 2014-09-07 DIAGNOSIS — I63411 Cerebral infarction due to embolism of right middle cerebral artery: Principal | ICD-10-CM

## 2014-09-07 DIAGNOSIS — M6289 Other specified disorders of muscle: Secondary | ICD-10-CM | POA: Diagnosis not present

## 2014-09-07 DIAGNOSIS — Z8711 Personal history of peptic ulcer disease: Secondary | ICD-10-CM | POA: Diagnosis not present

## 2014-09-07 DIAGNOSIS — Z8249 Family history of ischemic heart disease and other diseases of the circulatory system: Secondary | ICD-10-CM | POA: Diagnosis not present

## 2014-09-07 DIAGNOSIS — R748 Abnormal levels of other serum enzymes: Secondary | ICD-10-CM | POA: Diagnosis present

## 2014-09-07 DIAGNOSIS — I480 Paroxysmal atrial fibrillation: Secondary | ICD-10-CM | POA: Diagnosis not present

## 2014-09-07 DIAGNOSIS — I509 Heart failure, unspecified: Secondary | ICD-10-CM | POA: Diagnosis present

## 2014-09-07 DIAGNOSIS — Z720 Tobacco use: Secondary | ICD-10-CM

## 2014-09-07 DIAGNOSIS — I639 Cerebral infarction, unspecified: Secondary | ICD-10-CM | POA: Diagnosis present

## 2014-09-07 DIAGNOSIS — F17210 Nicotine dependence, cigarettes, uncomplicated: Secondary | ICD-10-CM | POA: Diagnosis present

## 2014-09-07 DIAGNOSIS — I129 Hypertensive chronic kidney disease with stage 1 through stage 4 chronic kidney disease, or unspecified chronic kidney disease: Secondary | ICD-10-CM | POA: Diagnosis present

## 2014-09-07 DIAGNOSIS — R471 Dysarthria and anarthria: Secondary | ICD-10-CM | POA: Diagnosis not present

## 2014-09-07 DIAGNOSIS — E785 Hyperlipidemia, unspecified: Secondary | ICD-10-CM | POA: Diagnosis present

## 2014-09-07 DIAGNOSIS — I4891 Unspecified atrial fibrillation: Secondary | ICD-10-CM | POA: Diagnosis present

## 2014-09-07 DIAGNOSIS — R4189 Other symptoms and signs involving cognitive functions and awareness: Secondary | ICD-10-CM | POA: Diagnosis present

## 2014-09-07 DIAGNOSIS — N182 Chronic kidney disease, stage 2 (mild): Secondary | ICD-10-CM | POA: Diagnosis present

## 2014-09-07 DIAGNOSIS — I63412 Cerebral infarction due to embolism of left middle cerebral artery: Secondary | ICD-10-CM | POA: Diagnosis present

## 2014-09-07 DIAGNOSIS — Z7982 Long term (current) use of aspirin: Secondary | ICD-10-CM | POA: Diagnosis not present

## 2014-09-07 DIAGNOSIS — G8192 Hemiplegia, unspecified affecting left dominant side: Secondary | ICD-10-CM | POA: Diagnosis present

## 2014-09-07 DIAGNOSIS — R7989 Other specified abnormal findings of blood chemistry: Secondary | ICD-10-CM | POA: Diagnosis not present

## 2014-09-07 DIAGNOSIS — I255 Ischemic cardiomyopathy: Secondary | ICD-10-CM | POA: Diagnosis present

## 2014-09-07 DIAGNOSIS — Z79899 Other long term (current) drug therapy: Secondary | ICD-10-CM | POA: Diagnosis not present

## 2014-09-07 DIAGNOSIS — I481 Persistent atrial fibrillation: Secondary | ICD-10-CM

## 2014-09-07 DIAGNOSIS — Z833 Family history of diabetes mellitus: Secondary | ICD-10-CM | POA: Diagnosis not present

## 2014-09-07 DIAGNOSIS — F17200 Nicotine dependence, unspecified, uncomplicated: Secondary | ICD-10-CM | POA: Insufficient documentation

## 2014-09-07 DIAGNOSIS — I429 Cardiomyopathy, unspecified: Secondary | ICD-10-CM | POA: Diagnosis not present

## 2014-09-07 DIAGNOSIS — I6521 Occlusion and stenosis of right carotid artery: Secondary | ICD-10-CM | POA: Diagnosis present

## 2014-09-07 DIAGNOSIS — E875 Hyperkalemia: Secondary | ICD-10-CM | POA: Diagnosis present

## 2014-09-07 LAB — BASIC METABOLIC PANEL
ANION GAP: 10 (ref 5–15)
BUN: 18 mg/dL (ref 6–20)
CO2: 22 mmol/L (ref 22–32)
Calcium: 9.1 mg/dL (ref 8.9–10.3)
Chloride: 107 mmol/L (ref 101–111)
Creatinine, Ser: 1.29 mg/dL — ABNORMAL HIGH (ref 0.61–1.24)
GFR calc Af Amer: >60 mL/min (ref >60)
GFR calc non Af Amer: 58 mL/min — ABNORMAL LOW (ref >60)
Glucose, Bld: 93 mg/dL (ref 65–99)
POTASSIUM: 4.2 mmol/L (ref 3.5–5.1)
Sodium: 139 mmol/L (ref 135–145)

## 2014-09-07 LAB — LIPID PANEL
CHOL/HDL RATIO: 2.8 ratio
Cholesterol: 135 mg/dL (ref 0–200)
HDL: 48 mg/dL (ref >40)
LDL CALC: 77 mg/dL (ref 0–99)
Triglycerides: 50 mg/dL (ref <150)
VLDL: 10 mg/dL (ref 0–40)

## 2014-09-07 LAB — CBC
HCT: 47.7 % (ref 39.0–52.0)
HEMOGLOBIN: 16.3 g/dL (ref 13.0–17.0)
MCH: 29.7 pg (ref 26.0–34.0)
MCHC: 34.2 g/dL (ref 30.0–36.0)
MCV: 87 fL (ref 78.0–100.0)
Platelets: 159 10*3/uL (ref 150–400)
RBC: 5.48 MIL/uL (ref 4.22–5.81)
RDW: 14.4 % (ref 11.5–15.5)
WBC: 6 10*3/uL (ref 4.0–10.5)

## 2014-09-07 LAB — URINALYSIS, ROUTINE W REFLEX MICROSCOPIC
Bilirubin Urine: NEGATIVE
Glucose, UA: NEGATIVE mg/dL
Hgb urine dipstick: NEGATIVE
KETONES UR: NEGATIVE mg/dL
Nitrite: NEGATIVE
Protein, ur: NEGATIVE mg/dL
Specific Gravity, Urine: 1.019 (ref 1.005–1.030)
Urobilinogen, UA: 1 mg/dL (ref 0.0–1.0)
pH: 5.5 (ref 5.0–8.0)

## 2014-09-07 LAB — TROPONIN I
Troponin I: 0.35 ng/mL — ABNORMAL HIGH (ref <0.031)
Troponin I: 0.27 ng/mL — ABNORMAL HIGH (ref <0.031)
Troponin I: 0.21 ng/mL — ABNORMAL HIGH (ref <0.031)

## 2014-09-07 LAB — GLUCOSE, CAPILLARY
GLUCOSE-CAPILLARY: 99 mg/dL (ref 65–99)
Glucose-Capillary: 96 mg/dL (ref 65–99)
Glucose-Capillary: 99 mg/dL (ref 65–99)
Glucose-Capillary: 101 mg/dL — ABNORMAL HIGH (ref 65–99)
Glucose-Capillary: 103 mg/dL — ABNORMAL HIGH (ref 65–99)

## 2014-09-07 LAB — COMPREHENSIVE METABOLIC PANEL
ALK PHOS: 94 U/L (ref 38–126)
ALT: 17 U/L (ref 17–63)
ANION GAP: 9 (ref 5–15)
AST: 23 U/L (ref 15–41)
Albumin: 3.9 g/dL (ref 3.5–5.0)
BILIRUBIN TOTAL: 1 mg/dL (ref 0.3–1.2)
BUN: 20 mg/dL (ref 6–20)
CALCIUM: 9.5 mg/dL (ref 8.9–10.3)
CHLORIDE: 108 mmol/L (ref 101–111)
CO2: 24 mmol/L (ref 22–32)
Creatinine, Ser: 1.49 mg/dL — ABNORMAL HIGH (ref 0.61–1.24)
GFR, EST AFRICAN AMERICAN: 57 mL/min — AB (ref >60)
GFR, EST NON AFRICAN AMERICAN: 49 mL/min — AB (ref >60)
Glucose, Bld: 93 mg/dL (ref 65–99)
Potassium: 4.3 mmol/L (ref 3.5–5.1)
Sodium: 141 mmol/L (ref 135–145)
Total Protein: 7.7 g/dL (ref 6.5–8.1)

## 2014-09-07 LAB — URINE MICROSCOPIC-ADD ON

## 2014-09-07 LAB — ACETAMINOPHEN LEVEL: Acetaminophen (Tylenol), Serum: <10 ug/mL — ABNORMAL LOW (ref 10–30)

## 2014-09-07 LAB — RAPID URINE DRUG SCREEN, HOSP PERFORMED
AMPHETAMINES: NOT DETECTED
Amphetamines: NOT DETECTED
BENZODIAZEPINES: NOT DETECTED
Barbiturates: NOT DETECTED
Barbiturates: NOT DETECTED
Benzodiazepines: NOT DETECTED
Cocaine: NOT DETECTED
Cocaine: NOT DETECTED
Opiates: NOT DETECTED
Opiates: NOT DETECTED
TETRAHYDROCANNABINOL: NOT DETECTED
Tetrahydrocannabinol: NOT DETECTED

## 2014-09-07 LAB — PROTIME-INR
INR: 1.16 (ref 0.00–1.49)
PROTHROMBIN TIME: 15 s (ref 11.6–15.2)

## 2014-09-07 LAB — SALICYLATE LEVEL: Salicylate Lvl: <4 mg/dL (ref 2.8–30.0)

## 2014-09-07 LAB — APTT: aPTT: 30 seconds (ref 24–37)

## 2014-09-07 LAB — ETHANOL

## 2014-09-07 MED ORDER — HEPARIN SODIUM (PORCINE) 5000 UNIT/ML IJ SOLN
5000.0000 [IU] | Freq: Three times a day (TID) | INTRAMUSCULAR | Status: DC
  Administered 2014-09-07 (×2): 5000 [IU] via SUBCUTANEOUS
  Filled 2014-09-07 (×2): qty 1

## 2014-09-07 MED ORDER — AMLODIPINE BESYLATE 5 MG PO TABS
5.0000 mg | ORAL_TABLET | Freq: Every day | ORAL | Status: DC
  Administered 2014-09-07 – 2014-09-09 (×3): 5 mg via ORAL
  Filled 2014-09-07 (×3): qty 1

## 2014-09-07 MED ORDER — ASPIRIN 325 MG PO TABS
325.0000 mg | ORAL_TABLET | Freq: Every day | ORAL | Status: DC
  Administered 2014-09-07 – 2014-09-08 (×2): 325 mg via ORAL
  Filled 2014-09-07 (×2): qty 1

## 2014-09-07 MED ORDER — METOPROLOL SUCCINATE ER 25 MG PO TB24
25.0000 mg | ORAL_TABLET | Freq: Every day | ORAL | Status: DC
  Filled 2014-09-07: qty 1

## 2014-09-07 MED ORDER — SIMVASTATIN 20 MG PO TABS
20.0000 mg | ORAL_TABLET | Freq: Every day | ORAL | Status: DC
  Administered 2014-09-07 – 2014-09-09 (×3): 20 mg via ORAL
  Filled 2014-09-07 (×3): qty 1

## 2014-09-07 MED ORDER — METOPROLOL SUCCINATE ER 25 MG PO TB24
12.5000 mg | ORAL_TABLET | Freq: Every day | ORAL | Status: DC
  Administered 2014-09-07 – 2014-09-08 (×2): 12.5 mg via ORAL
  Filled 2014-09-07: qty 1

## 2014-09-07 MED ORDER — METOPROLOL TARTRATE 1 MG/ML IV SOLN
5.0000 mg | Freq: Four times a day (QID) | INTRAVENOUS | Status: DC | PRN
  Administered 2014-09-07: 5 mg via INTRAVENOUS
  Filled 2014-09-07: qty 5

## 2014-09-07 MED ORDER — IOHEXOL 350 MG/ML SOLN
80.0000 mL | Freq: Once | INTRAVENOUS | Status: AC | PRN
  Administered 2014-09-07: 80 mL via INTRAVENOUS

## 2014-09-07 MED ORDER — SODIUM CHLORIDE 0.9 % IV SOLN
INTRAVENOUS | Status: DC
  Administered 2014-09-07: 07:00:00 via INTRAVENOUS
  Administered 2014-09-07: 75 mL/h via INTRAVENOUS

## 2014-09-07 MED ORDER — APIXABAN 5 MG PO TABS
5.0000 mg | ORAL_TABLET | Freq: Two times a day (BID) | ORAL | Status: DC
  Administered 2014-09-07 – 2014-09-10 (×6): 5 mg via ORAL
  Filled 2014-09-07 (×7): qty 1

## 2014-09-07 MED ORDER — STROKE: EARLY STAGES OF RECOVERY BOOK: Freq: Once | Status: DC

## 2014-09-07 NOTE — ED Notes (Signed)
[facility] called for transport to [place]. 

## 2014-09-07 NOTE — Consult Note (Signed)
Stroke Consult    Chief Complaint: weakness, facial droop  HPI: Jeffrey Martin is an 64 y.o. male history of A fib presenting with 2 day history of speech difficulty and left sided weakness. Family reports acute onset 2 days ago, they note his speech has been different and his left side appears weaker with question of left facial droop. No sensory or visual deficits. No prior CVA or TIA history. No recent illness, no falls or head trauma. Ethanol and UDS negative.   Recently diagnosed with A fib, not on anticoagulation. CT head imaging reviewed, shows no acute process. 2D echo on 6/27 with EF 35 to 40%  Date last known well: 7/18 Time last known well: 1200 tPA Given: no, outside tPA window  Modified Rankin: Rankin Score=0  Past Medical History  Diagnosis Date  . Peptic ulcer   . Irregular heart beat     Past Surgical History  Procedure Laterality Date  . Repair of perforated ulcer      History reviewed. No pertinent family history. Social History:  reports that he has been smoking Cigarettes.  He has been smoking about 0.30 packs per day. He does not have any smokeless tobacco history on file. He reports that he drinks alcohol. He reports that he does not use illicit drugs.  Allergies: No Known Allergies   (Not in a hospital admission)  ROS: Out of a complete 14 system review, the patient complains of only the following symptoms, and all other reviewed systems are negative. +speech difficulty   Physical Examination: Filed Vitals:   09/07/14 0100  BP: 113/96  Pulse:   Resp: 27   Physical Exam  Constitutional: He appears well-developed and well-nourished.  Psych: Affect appropriate to situation Eyes: No scleral injection HENT: No OP obstrucion Head: Normocephalic.  Cardiovascular: Normal rate and regular rhythm.  Respiratory: Effort normal and breath sounds normal.  GI: Soft. Bowel sounds are normal. No distension. There is no tenderness.  Skin:  WDI  Neurologic Examination: Mental Status: Alert, oriented, thought content appropriate. Limited verbal output. Moderate dysarthria and hypophonia.  Able to follow 3 step commands without difficulty. Cranial Nerves: II: funduscopic exam wnl bilaterally, visual fields grossly normal, pupils equal, round, reactive to light and accommodation III,IV, VI: ptosis not present, extra-ocular motions intact bilaterally V,VII: left sided facial weakness, facial light touch sensation normal bilaterally VIII: hearing normal bilaterally IX,X: gag reflex present XI: trapezius strength/neck flexion strength normal bilaterally XII: tongue strength normal  Motor: Right : Upper extremity    Left:     Upper extremity 5/5 deltoid       5-/5 deltoid 5/5 biceps      5-/5 biceps  5/5 triceps      5-/5 triceps 5/5 hand grip      5-/5 hand grip  Lower extremity     Lower extremity 5/5 hip flexor      5-/5 hip flexor 5/5 quadricep      5-/5 quadriceps  5/5 hamstrings     5/5 hamstrings 5/5 plantar flexion       5/5 plantar flexion 5/5 plantar extension     5/5 plantar extension Tone and bulk:normal tone throughout; no atrophy noted Sensory: Pinprick and light touch intact throughout, bilaterally Deep Tendon Reflexes: 1+ and symmetric throughout Plantars: Right: downgoing   Left: downgoing Cerebellar: normal finger-to-nose, and normal heel-to-shin test Gait: deferred  Laboratory Studies:   Basic Metabolic Panel:  Recent Labs Lab 09/06/14 2302 09/07/14 0011  NA 139 141  K  6.7* 4.3  CL 108 108  CO2  --  24  GLUCOSE 99 93  BUN 30* 20  CREATININE 1.60* 1.49*  CALCIUM  --  9.5    Liver Function Tests:  Recent Labs Lab 09/07/14 0011  AST 23  ALT 17  ALKPHOS 94  BILITOT 1.0  PROT 7.7  ALBUMIN 3.9   No results for input(s): LIPASE, AMYLASE in the last 168 hours. No results for input(s): AMMONIA in the last 168 hours.  CBC:  Recent Labs Lab 09/06/14 2254 09/06/14 2302  WBC 6.8  --    NEUTROABS 3.3  --   HGB 16.8 19.7*  HCT 50.3 58.0*  MCV 88.9  --   PLT 188  --     Cardiac Enzymes: No results for input(s): CKTOTAL, CKMB, CKMBINDEX, TROPONINI in the last 168 hours.  BNP: Invalid input(s): POCBNP  CBG:  Recent Labs Lab 09/06/14 2236  GLUCAP 103*    Microbiology: No results found for this or any previous visit.  Coagulation Studies:  Recent Labs  09/07/14 0011  LABPROT 15.0  INR 1.16    Urinalysis:  Recent Labs Lab 09/07/14 0022  COLORURINE YELLOW  LABSPEC 1.019  PHURINE 5.5  GLUCOSEU NEGATIVE  HGBUR NEGATIVE  BILIRUBINUR NEGATIVE  KETONESUR NEGATIVE  PROTEINUR NEGATIVE  UROBILINOGEN 1.0  NITRITE NEGATIVE  LEUKOCYTESUR SMALL*    Lipid Panel:  No results found for: CHOL, TRIG, HDL, CHOLHDL, VLDL, LDLCALC  HgbA1C: No results found for: HGBA1C  Urine Drug Screen:     Component Value Date/Time   LABOPIA NONE DETECTED 09/07/2014 0022   COCAINSCRNUR NONE DETECTED 09/07/2014 0022   LABBENZ NONE DETECTED 09/07/2014 0022   AMPHETMU NONE DETECTED 09/07/2014 0022   THCU NONE DETECTED 09/07/2014 0022   LABBARB NONE DETECTED 09/07/2014 0022    Alcohol Level:  Recent Labs Lab 09/07/14 0011  ETH <5    Other results: EKG: atrial fibrillation.  Imaging: Dg Chest 2 View  09/07/2014   CLINICAL DATA:  64 year old male with altered mental status  EXAM: CHEST  2 VIEW  COMPARISON:  None.  FINDINGS: The heart size and mediastinal contours are within normal limits. Both lungs are clear. The visualized skeletal structures are unremarkable.  Surgical clips noted over the epigastric area.  IMPRESSION: No active cardiopulmonary disease.   Electronically Signed   By: Elgie Collard M.D.   On: 09/07/2014 00:28   Ct Head Wo Contrast  09/07/2014   CLINICAL DATA:  64 year old male with slurred speech  EXAM: CT HEAD WITHOUT CONTRAST  TECHNIQUE: Contiguous axial images were obtained from the base of the skull through the vertex without intravenous  contrast.  COMPARISON:  None.  FINDINGS: The ventricles are dilated and the sulci are prominent compatible with age-related atrophy. Periventricular and deep white matter hypodensities represent chronic microvascular ischemic changes. There is no intracranial hemorrhage. No mass effect or midline shift identified.  The visualized paranasal sinuses and mastoid air cells are well aerated. The calvarium is intact.  IMPRESSION: No acute intracranial pathology.  Age-related atrophy and chronic microvascular ischemic disease.  If symptoms persist and there are no contraindications, MRI may provide better evaluation if clinically indicated.   Electronically Signed   By: Elgie Collard M.D.   On: 09/07/2014 00:31    Assessment: 64 y.o. male hx of recently diagnosed A fib (not on anticoagulation) presenting with 2 day history of left sided weakness and slurred speech. CT head imaging reviewed and overall unremarkable. With hx of A fib concern  for possible embolic infarct.   Plan: 1. HgbA1c, fasting lipid panel 2. MRI, MRA  of the brain without contrast 3. PT consult, OT consult, Speech consult 4. Echocardiogram recently completed 5. Carotid dopplers 6. Prophylactic therapy-ASA 325mg . May need to consider switching to anticoagulation depending on MRI results 7. Risk factor modification 8. Telemetry monitoring 9. Frequent neuro checks 10. NPO until RN stroke swallow screen   Elspeth Cho, DO [facility] [phone]  If 7pm- 7am, please page neurology on call as listed in AMION. 09/07/2014, 1:54 AM

## 2014-09-07 NOTE — Progress Notes (Signed)
Pt arrived to unit alert and oriented. Oriented to unit and room. Call bell at bedside. Bed alarm activated. Will continue to monitor. Gara Kroner, RN

## 2014-09-07 NOTE — Progress Notes (Signed)
Echocardiogram 2D Echocardiogram has been performed.  Dorothey Baseman 09/07/2014, 2:49 PM

## 2014-09-07 NOTE — Evaluation (Signed)
Physical Therapy Evaluation Patient Details Name: Jeffrey Martin MRN: [id number] DOB: [birth year] Today's Date: 09/07/2014   History of Present Illness  64 yo male admitted with weakness. Tranfered from [facility] to [facility]. CT (-) MRI (+) R internal carotid artery occluded, acute R lenticular nucleus with extension into the posterior limb of the right internal capsule and posterior aspect of the right corona radiata, posterior right peri operculum region and R temporal lobe. 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 Pt currently with functional limitations due to the deficits listed below (see PT Problem List). Patient is a poor historian with verbal expressive difficulties although oriented x4 during PT evaluation today. Increased time required with following commands, and mildly dysmetric and weak LUE and LLE. Comprehensive evaluation limited due to increasing diastolic BP up to 114 with minimal exertion.Telemetry monitor indicating HR 79-100 with frequent PVCs during therapy session. Pt will benefit from skilled PT to increase their independence and safety with mobility to allow discharge to the venue listed below.       Follow Up Recommendations SNF;Supervision/Assistance - 24 hour    Equipment Recommendations   (TBD)    Recommendations for Other Services       Precautions / Restrictions Precautions Precautions: Fall Restrictions Weight Bearing Restrictions: No      Mobility  Bed Mobility Overal bed mobility: Modified Independent             General bed mobility comments: increased time  Transfers Overall transfer level: Needs assistance Equipment used: 1 person hand held assist Transfers: Sit to/from Stand Sit to Stand: Min assist         General transfer comment: Min assist for balance with single hand support for patient to rise from lowest bed setting.  Minimal sway noted. Diastolic BP elevated to 114 and had pt return to seated position. HR maintaining between 79-100 with tele indicating PVCs.  Ambulation/Gait             General Gait Details: Held at this time due to increasing diastolic BP.  Stairs            Wheelchair Mobility    Modified Rankin (Stroke Patients Only)       Balance Overall balance assessment: Needs assistance Sitting-balance support: No upper extremity supported;Feet supported Sitting balance-Leahy Scale: Good     Standing balance support: No upper extremity supported Standing balance-Leahy Scale: Fair                               Pertinent Vitals/Pain Pain Assessment: No/denies pain  Sitting BP 159/113 Standing BP 163/114    Home Living Family/patient expects to be discharged to:: Private residence Living Arrangements: Spouse/significant other Available Help at Discharge: Family Type of Home: House Home Access: Stairs to enter   Secretary/administrator of Steps: "a couple" Home Layout: One level Home Equipment: None Additional Comments: unsure of accuracy of home setup and supervision due to altered cognition    Prior Function Level of Independence: Independent               Hand Dominance   Dominant Hand: Right    Extremity/Trunk Assessment   Upper Extremity Assessment: Defer to OT evaluation (dysmetria LUE FNF test)           Lower Extremity Assessment: LLE deficits/detail   LLE Deficits /  Details: 4/5 strength hip flexors, knee flexors - 5/5 grossly throughout elsewhere     Communication   Communication: Expressive difficulties  Cognition Arousal/Alertness: Lethargic Behavior During Therapy: Flat affect Overall Cognitive Status: Impaired/Different from baseline Area of Impairment: Attention;Following commands;Safety/judgement;Awareness;Problem solving   Current Attention Level: Focused   Following Commands: Follows one step commands with  increased time Safety/Judgement: Decreased awareness of safety;Decreased awareness of deficits Awareness: Intellectual Problem Solving: Slow processing General Comments: Pt tearful in therapy session. Was oriented x4.    General Comments General comments (skin integrity, edema, etc.): Limited evaluation due to increasing diastolic BP with minimal exertion. See vitals.    Exercises        Assessment/Plan    PT Assessment Patient needs continued PT services  PT Diagnosis Difficulty walking;Acute pain;Hemiplegia non-dominant side   PT Problem List Decreased strength;Decreased range of motion;Decreased activity tolerance;Decreased balance;Decreased mobility;Decreased coordination;Decreased cognition;Decreased knowledge of use of DME;Decreased safety awareness  PT Treatment Interventions DME instruction;Gait training;Stair training;Functional mobility training;Therapeutic activities;Therapeutic exercise;Balance training;Neuromuscular re-education;Cognitive remediation;Patient/family education   PT Goals (Current goals can be found in the Care Plan section) Acute Rehab PT Goals Patient Stated Goal: None stated PT Goal Formulation: Patient unable to participate in goal setting Time For Goal Achievement: 09/21/14 Potential to Achieve Goals: Good    Frequency Min 4X/week   Barriers to discharge Decreased caregiver support unsure of assistance available at home.    Co-evaluation               End of Session   Activity Tolerance: Treatment limited secondary to medical complications (Comment) (Increasing diastolic BP) Patient left: in bed;with call bell/phone within reach;with SCD's reapplied Nurse Communication: Mobility status;Other (comment) (Vitals)         Time: 2725-3664 PT Time Calculation (min) (ACUTE ONLY): 24 min   Charges:   PT Evaluation $Initial PT Evaluation Tier I: 1 Procedure PT Treatments $Therapeutic Activity: 8-22 mins   PT G CodesBerton Mount 09/07/2014, 2:13 PM Sunday Spillers [place], [place] [phone]

## 2014-09-07 NOTE — Progress Notes (Signed)
Patients blood pressure and pulse are irregular notified attending and she has written orders for PEN medications will continue to monitor.

## 2014-09-07 NOTE — ED Notes (Signed)
Pt son Almyra Brace would like to be notified when father is being transported and to what room he is going. [phone]

## 2014-09-07 NOTE — Progress Notes (Signed)
STROKE TEAM PROGRESS NOTE   HISTORY Jeffrey Martin is an 64 y.o. male history of A fib presenting with 2 day history of speech difficulty and left sided weakness. Family reports acute onset 2 days ago, they note his speech has been different and his left side appears weaker with question of left facial droop. No sensory or visual deficits. No prior CVA or TIA history. No recent illness, no falls or head trauma. Ethanol and UDS negative.  Recently diagnosed with A fib, not on anticoagulation. CT head imaging reviewed, shows no acute process. 2D echo on 6/27 with EF 35 to 40%. He was last known well 7/18 at 1200. Modified Rankin: Rankin Score=0. Patient was not administered TPA secondary to outside tPA window. He was admitted for further evaluation and treatment.   SUBJECTIVE (INTERVAL HISTORY) No family is at the bedside.  Overall his condition is stable. He was sleeping initially and not answer questions on waking up, but then telephone rang and he picked it up and talked with somebody over the phone happily. Then he start to communicate with me although still hesitant on speech with paucity of speech, but following commands.   OBJECTIVE Temp:  [97.6 F (36.4 C)-97.7 F (36.5 C)] 97.7 F (36.5 C) (07/20 0650) Pulse Rate:  [36-107] 53 (07/20 0650) Cardiac Rhythm:  [-]  Resp:  [10-27] 18 (07/20 0650) BP: (113-181)/(94-128) 140/110 mmHg (07/20 0650) SpO2:  [97 %-100 %] 100 % (07/20 0650) Weight:  [69.037 kg (152 lb 3.2 oz)] 69.037 kg (152 lb 3.2 oz) (07/20 0434)   Recent Labs Lab 09/06/14 2236 09/07/14 0745  GLUCAP 103* 96    Recent Labs Lab 09/06/14 2302 09/07/14 0011 09/07/14 0525  NA 139 141 139  K 6.7* 4.3 4.2  CL 108 108 107  CO2  --  24 22  GLUCOSE 99 93 93  BUN 30* 20 18  CREATININE 1.60* 1.49* 1.29*  CALCIUM  --  9.5 9.1    Recent Labs Lab 09/07/14 0011  AST 23  ALT 17  ALKPHOS 94  BILITOT 1.0  PROT 7.7  ALBUMIN 3.9    Recent Labs Lab 09/06/14 2254  09/06/14 2302  WBC 6.8  --   NEUTROABS 3.3  --   HGB 16.8 19.7*  HCT 50.3 58.0*  MCV 88.9  --   PLT 188  --    No results for input(s): CKTOTAL, CKMB, CKMBINDEX, TROPONINI in the last 168 hours.  Recent Labs  09/07/14 0011  LABPROT 15.0  INR 1.16    Recent Labs  09/07/14 0022  COLORURINE YELLOW  LABSPEC 1.019  PHURINE 5.5  GLUCOSEU NEGATIVE  HGBUR NEGATIVE  BILIRUBINUR NEGATIVE  KETONESUR NEGATIVE  PROTEINUR NEGATIVE  UROBILINOGEN 1.0  NITRITE NEGATIVE  LEUKOCYTESUR SMALL*       Component Value Date/Time   CHOL 135 09/07/2014 0525   TRIG 50 09/07/2014 0525   HDL 48 09/07/2014 0525   CHOLHDL 2.8 09/07/2014 0525   VLDL 10 09/07/2014 0525   LDLCALC 77 09/07/2014 0525   No results found for: HGBA1C    Component Value Date/Time   LABOPIA NONE DETECTED 09/07/2014 0022   COCAINSCRNUR NONE DETECTED 09/07/2014 0022   LABBENZ NONE DETECTED 09/07/2014 0022   AMPHETMU NONE DETECTED 09/07/2014 0022   THCU NONE DETECTED 09/07/2014 0022   LABBARB NONE DETECTED 09/07/2014 0022     Recent Labs Lab 09/07/14 0011  ETH <5    Dg Chest 2 View 09/07/2014    No active cardiopulmonary disease.  Ct Head Wo Contrast 09/07/2014    No acute intracranial pathology.  Age-related atrophy and chronic microvascular ischemic disease.    MRI HEAD   09/07/2014    Exam is motion degraded.  Acute nonhemorrhagic infarct right lenticular nucleus with extension into the posterior limb of the right internal capsule and the posterior aspect of the right corona radiata. Smaller tiny acute infarct anterior right corona radiata, posterior right peri operculum region and right temporal lobe. Tiny acute infarct peripheral aspect of the junction of the right frontal- parietal lobe.  No intracranial hemorrhage.  Mild to moderate small vessel disease type changes.  Plaque-like calcification/ossification left aspect of the falx. 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 Otherwise no evidence of intracranial mass detected on this unenhanced exam.  Global atrophy without hydrocephalus.  C3-4 bulge/protrusion with cord flattening.    MRA HEAD   09/07/2014    Exam is motion degraded.  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 Collateral flow to the right carotid terminus.  No high-grade stenosis of the M1 segment of either middle cerebral artery. Poor delineation of majority of middle cerebral artery branch vessels bilaterally more notable on the right.  Ectatic anterior cerebral arteries. A tiny anterior communicating artery aneurysm cannot be excluded as questioned on source images. This could be reassessed on follow-up when patient is better able to cooperate.  Left vertebral artery slightly dominant in size. No significant stenosis of the distal vertebral arteries.  Mild to moderate narrowing mid aspect of the basilar artery. Nonvisualized anterior inferior cerebral arteries.     CTA head and neck - 1. Occlusive embolus to the supraclinoid right ICA with good downstream flow via this anterior communicating artery. The right cervical ICA is likely patent, with delayed filling seen at the skullbase. 2. Mild atheromatous change, mainly at the right carotid Bifurcation.  2D echo - - Left ventricle: The cavity size was normal. Systolic function was moderately to severely reduced. The estimated ejection fraction was in the range of 30% to 35%. Wall motion was normal; there were no regional wall motion abnormalities. - Ascending aorta: The ascending aorta was mildly dilated measuring 40 mm. - Mitral valve: There was moderate regurgitation. - Left atrium: The atrium was severely dilated. - Right ventricle: Systolic function was normal. - Right atrium: The atrium was moderately dilated. - Tricuspid valve: There was moderate regurgitation. - Pulmonary arteries: Systolic pressure was mildly increased. PA peak pressure: 39 mm Hg (S). - Inferior  vena cava: The vessel was normal in size. - Pericardium, extracardiac: There was no pericardial effusion.  Impressions: - There is no significant difference when compared to the prior study from 08/15/2014.  PHYSICAL EXAM  Temp:  [97.5 F (36.4 C)-98.2 F (36.8 C)] 97.5 F (36.4 C) (07/20 1530) Pulse Rate:  [36-107] 68 (07/20 1545) Resp:  [10-27] 19 (07/20 1530) BP: (113-181)/(94-135) 140/98 mmHg (07/20 1545) SpO2:  [97 %-100 %] 97 % (07/20 1530) Weight:  [152 lb 3.2 oz (69.037 kg)] 152 lb 3.2 oz (69.037 kg) (07/20 0434)  General - Well nourished, well developed, in no apparent distress, sleepy initially.  Ophthalmologic - Fundi not visualized due to eye and head movement.  Cardiovascular - irregularly irregular heart rate and rhythm.  Mental Status -  Level of arousal and orientation to time, place, and person were intact. Language including expression, repetition, comprehension was assessed and found intact, but naming 2/4. Significant psychomotor slowing and paucity of speech, not sure if effort related.  Pt seems better responding over the phone with friends during round.   Cranial Nerves II - XII - II - Visual field intact OU. III, IV, VI - Extraocular movements intact. V - Facial sensation intact bilaterally. VII - left facial droop. VIII - Hearing & vestibular intact bilaterally. X - Palate elevates symmetrically. XI - Chin turning & shoulder shrug intact bilaterally. XII - Tongue protrusion intact.  Motor Strength - The patient's strength was normal in all extremities except left UE 5-/5 and pronator drift was present on the left.  Bulk was normal and fasciculations were absent.   Motor Tone - Muscle tone was assessed at the neck and appendages and was normal.  Reflexes - The patient's reflexes were 1+ in all extremities and he had no pathological reflexes.  Sensory - Light touch, temperature/pinprick were assessed and were symmetrical.    Coordination - The  patient had normal movements in the hands and feet with no ataxia or dysmetria.  Tremor was absent.  Gait and Station - deferred due to sleepiness.   ASSESSMENT/PLAN Mr. Jamel Holzmann is a 64 y.o. male with history of atrial fibrillation presenting with left sided weakness and left facial droop. He did not receive IV t-PA due to delay in arrival.   Stroke:  Right lenticular nucleaus/PLIC/corona radiata infarct as well as tiny R corona radiatia, poster R opercular and R temporal lobe infarcts, embolic secondary to known atrial fibrillation   Resultant  Left facial droop and mild LUE weakness  MRI  right lenticular nucleaus/PLIC/corona radiata infarct as well as tiny R corona radiatia, poster R opercular and R temporal lobe infarcts,   MRA  R ICA occlusion  CTA head and neck showed right distal ICA occlusion  2D Echo  EF 30-35%  LDL 77  HgbA1c pending  Heparin 5000 units sq tid for VTE prophylaxis  Diet Heart Room service appropriate?: Yes; Fluid consistency:: Thin  aspirin 81 mg orally every day prior to admission, now on aspirin 325 mg orally every day. Due to small size of infarct, recommend anticoagulation with eliquis 5mg  bid staring tonight.   Patient counseled to be compliant with his antithrombotic medications  Ongoing aggressive stroke risk factor management  Therapy recommendations:  pending  Disposition:  pending  Atrial Fibrillation  Home anticoagulation:  None  CHA2DS2-VASc Score = 2, ?2 oral anticoagulation recommended  Age in Years:  <65   0    Sex:  Male   0    Hypertension History:  0     Diabetes Mellitus:  0   Congestive Heart Failure History:  0  Vascular Disease History:  0     Stroke/TIA/Thromboembolism History:  yes   +2  Recommend eliquis 5mg  bid  Cardiomyopathy  EF 30-35% on echo  Pt may need stress test and cardiac cath  Pt has cardiology follow up as outpt  May consider cardiology inpt consult    Hypertension  Home meds:    metoprolo  Stable  Permissive hypertension (OK if <220/120) for 24-48 hours post stroke and then gradually normalized within 5-7 days.  Patient counseled to be compliant with his blood pressure medications  Hyperlipidemia  Home meds:  none  LDL 77, goal < 70  Add zocor 20mg    Continue statin at discharge  Tobacco abuse  Current smoker  Smoking cessation counseling provided  Pt is willing to quit  Other Stroke Risk Factors  ETOH use  Other Active Problems  CKD - Cre 1.29, close monitoring and IVF  Hyperkalemia - supplement  Hospital day # 0  Marvel Plan, MD PhD Stroke Neurology 09/07/2014 6:13 PM      To contact Stroke Continuity provider, please refer to [email or web address]. After hours, contact General Neurology

## 2014-09-07 NOTE — Evaluation (Signed)
Occupational Therapy Evaluation Patient Details Name: Jeffrey Martin MRN: [id number] DOB: Jul 14, 1950 Today's Date: 09/07/2014    History of Present Illness 64 yo male admitted with weakness. Tranfered from [facility] to [facility]. CT (-) MRI (+) R internal carotid artery occluded, acute R lenticular nucleus with extension into the posterior limb of the right internal capsule and posterior aspect of the right corona radiata, posterior right peri operculum region and R temporal lobe. tiny acute R frontal parietal lobe infarct PMH: peptic ulcer, iregular heart beat, smokes, alcohol    Clinical Impression   Pt oriented to self only; able to tell name but not DOB. Pt able to follow 1 step commands with increased time. Pt with noted expressive difficulties. Pt denies vision or sensation deficits; reports spouse available to assist with ADLs upon d/c; assessment limited due to cognition. Pt will benefit from acute OT for increased safety and independence with ADLs.      Follow Up Recommendations  Supervision/Assistance - 24 hour;SNF    Equipment Recommendations  Other (comment) (TBD)    Recommendations for Other Services       Precautions / Restrictions Precautions Precautions: Fall Restrictions Weight Bearing Restrictions: No      Mobility Bed Mobility Overal bed mobility: Modified Independent             General bed mobility comments: increased time  Transfers Overall transfer level: Needs assistance Equipment used: None Transfers: Sit to/from Stand Sit to Stand: Min guard              Balance Overall balance assessment: Needs assistance Sitting-balance support: No upper extremity supported;Feet supported Sitting balance-Leahy Scale: Fair     Standing balance support: No upper extremity supported                                ADL Overall ADL's : Needs assistance/impaired Eating/Feeding: Independent;Sitting   Grooming: Standing;Min guard   Upper Body  Bathing: Supervision/ safety;Sitting   Lower Body Bathing: Supervison/ safety;Sit to/from stand   Upper Body Dressing : Supervision/safety;Sitting   Lower Body Dressing: Supervision/safety;Sit to/from stand   Toilet Transfer: Min guard;Ambulation;Regular Toilet   Toileting- Clothing Manipulation and Hygiene: Modified independent;Sit to/from stand   Tub/ Shower Transfer: Minimal assistance;Ambulation   Functional mobility during ADLs: Min guard General ADL Comments: No family present to determine baseline; Pt able to follow 1 step commands for ADLs with increased time     Vision Vision Assessment?: Vision impaired- to be further tested in functional context Additional Comments: difficult to assess due to cognition   Perception     Praxis      Pertinent Vitals/Pain Pain Assessment: No/denies pain     Hand Dominance Right   Extremity/Trunk Assessment Upper Extremity Assessment Upper Extremity Assessment: Overall WFL for tasks assessed   Lower Extremity Assessment Lower Extremity Assessment: Overall WFL for tasks assessed       Communication Communication Communication: Expressive difficulties   Cognition Arousal/Alertness: Lethargic Behavior During Therapy: Impulsive Overall Cognitive Status: Impaired/Different from baseline Area of Impairment: Orientation;Attention;Following commands;Safety/judgement;Awareness;Problem solving Orientation Level: Disoriented to;Person;Place;Time;Situation Current Attention Level: Focused   Following Commands: Follows one step commands with increased time Safety/Judgement: Decreased awareness of safety Awareness: Intellectual Problem Solving: Slow processing General Comments: Pt randomly laughing throughout eval; unable to explain reason for laughter   General Comments       Exercises       Shoulder Instructions  Home Living Family/patient expects to be discharged to:: Private residence Living Arrangements:  Spouse/significant other Available Help at Discharge: Family Type of Home: House Home Access: Stairs to enter Secretary/administrator of Steps: "a couple"   Home Layout: One level     Bathroom Shower/Tub: Tub/shower unit Shower/tub characteristics: Engineer, building services: Standard     Home Equipment: None   Additional Comments: unsure of accurace of home setup due to cognition      Prior Functioning/Environment Level of Independence: Independent             OT Diagnosis: Generalized weakness;Cognitive deficits   OT Problem List: Decreased strength;Decreased activity tolerance;Impaired balance (sitting and/or standing);Decreased safety awareness   OT Treatment/Interventions: Self-care/ADL training;Therapeutic exercise;Therapeutic activities;Patient/family education;Balance training    OT Goals(Current goals can be found in the care plan section) Acute Rehab OT Goals Patient Stated Goal: go back to sleep OT Goal Formulation: With patient Time For Goal Achievement: 09/21/14 Potential to Achieve Goals: Good  OT Frequency: Min 3X/week   Barriers to D/C:            Co-evaluation              End of Session Equipment Utilized During Treatment: Gait belt Nurse Communication: Mobility status  Activity Tolerance: Patient limited by lethargy Patient left: in bed;with call bell/phone within reach;with nursing/sitter in room;with bed alarm set   Time: 3419-6222 OT Time Calculation (min): 26 min Charges:  OT General Charges $OT Visit: 1 Procedure OT Evaluation $Initial OT Evaluation Tier I: 1 Procedure OT Treatments $Self Care/Home Management : 8-22 mins G-Codes:    Marden Noble 09/07/2014, 9:05 AM

## 2014-09-07 NOTE — Progress Notes (Signed)
ANTICOAGULATION CONSULT NOTE - Initial Consult  Pharmacy Consult for Eliquis Indication: atrial fibrillation  No Known Allergies  Patient Measurements: Height: 5' 7.5" (171.5 cm) Weight: 152 lb 3.2 oz (69.037 kg) IBW/kg (Calculated) : 67.25  Vital Signs: Temp: 97.5 F (36.4 C) (07/20 1530) Temp Source: Oral (07/20 1530) BP: 140/98 mmHg (07/20 1545) Pulse Rate: 68 (07/20 1545)  Labs:  Recent Labs  09/06/14 2254 09/06/14 2302 09/07/14 0011 09/07/14 0525 09/07/14 0808 09/07/14 1048  HGB 16.8 19.7*  --   --  16.3  --   HCT 50.3 58.0*  --   --  47.7  --   PLT 188  --   --   --  159  --   APTT  --   --  30  --   --   --   LABPROT  --   --  15.0  --   --   --   INR  --   --  1.16  --   --   --   CREATININE  --  1.60* 1.49* 1.29*  --   --   TROPONINI  --   --   --   --   --  0.35*    Estimated Creatinine Clearance: 57.2 mL/min (by C-G formula based on Cr of 1.29).   Medical History: Past Medical History  Diagnosis Date  . Peptic ulcer   . Irregular heart beat   . Hypertension     Medications:  Prescriptions prior to admission  Medication Sig Dispense Refill Last Dose  . aspirin 81 MG chewable tablet Chew 4 tablets (324 mg total) by mouth daily. 120 tablet 0 09/06/2014 at Unknown time  . Aspirin-Salicylamide-Caffeine (BC HEADACHE POWDER PO) Take 1 Package by mouth daily as needed (pain).   PRN  . metoprolol succinate (TOPROL-XL) 25 MG 24 hr tablet Take 1 tablet (25 mg total) by mouth daily. 30 tablet 6 09/06/2014 at 0730  . Multiple Vitamins-Minerals (MULTIVITAMIN & MINERAL PO) Take 1 tablet by mouth daily.   09/05/2014 at Unknown time    Assessment: AMS  64 year old male transferred from [facility] to [facility] who was recently diagnosed atrial fibrillation presents with AMS. CT negative. MRI positive. CHADSVASC=3. Scr 1.29 this AM with CrCl 57. Baseline CBC WNL. LDL 77.   Goal of Therapy:  Therapeutic oral anticoagulation Monitor platelets by anticoagulation protocol: Yes   Plan:  Eliquis 5mg  BID S/c SQ heparin  Heddy Vidana S. Merilynn Finland, PharmD, BCPS Clinical Staff Pharmacist Pager [phone]   Misty Stanley Stillinger 09/07/2014,6:04 PM

## 2014-09-07 NOTE — H&P (Signed)
PCP:   none   Chief Complaint:    HPI: This is a 64 year old male who was recently diagnosed atrial fibrillation based PCP started on metoprolol. He states is being compressed with this medication. He was last seen by family on Sunday, today when he was not speaking appropriately. Per family this was noted from Monday on the phone. Patient lives with his wife, however, per family members at bedside she is often heavily medicated and would not be able to give any additional information. Patient is also unable to give any significant information as to when his symptoms started. He has some amount of dysarthria and just an add speech pattern. Per family members at bedside this is not his baseline. Initially the patient appeared forgetful, however, he later focused somewhat and was able to answer some questions regarding his medications. Family members at bedside was were not able to provide significant information and stated they have not seen him for a days and just presented to the ER to give him support.   Review of Systems:  The patient denies anorexia, fever, weight loss,, vision loss, decreased hearing, hoarseness, chest pain, syncope, dyspnea on exertion, peripheral edema, balance deficits, hemoptysis, abdominal pain, melena, hematochezia, severe indigestion/heartburn, hematuria, incontinence, genital sores, muscle weakness, suspicious skin lesions, transient blindness, difficulty walking, depression, unusual weight change, abnormal bleeding, enlarged lymph nodes, angioedema, and breast masses.  Past Medical History: Past Medical History  Diagnosis Date  . Peptic ulcer   . Irregular heart beat    Past Surgical History  Procedure Laterality Date  . Repair of perforated ulcer      Medications: Prior to Admission medications   Medication Sig Start Date End Date Taking? Authorizing Provider  aspirin 81 MG chewable tablet Chew 4 tablets (324 mg total) by mouth daily. 08/10/14  Yes Gerhard Munch, MD  Aspirin-Salicylamide-Caffeine (BC HEADACHE POWDER PO) Take 1 Package by mouth daily as needed (pain).   Yes Historical Provider, MD  metoprolol succinate (TOPROL-XL) 25 MG 24 hr tablet Take 1 tablet (25 mg total) by mouth daily. 08/25/14  Yes Newman Nip, NP  Multiple Vitamins-Minerals (MULTIVITAMIN & MINERAL PO) Take 1 tablet by mouth daily.   Yes Historical Provider, MD    Allergies:  No Known Allergies  Social History:  reports that he has been smoking Cigarettes.  He has been smoking about 0.30 packs per day. He does not have any smokeless tobacco history on file. He reports that he drinks alcohol. He reports that he does not use illicit drugs.  Family History: CAD, DM  Physical Exam: Filed Vitals:   09/07/14 0145 09/07/14 0228 09/07/14 0230 09/07/14 0245  BP: 165/117 129/103 150/119 152/106  Pulse: 55 83 93 90  Resp: 19 19 21 21   SpO2: 100% 97% 97% 99%    General:  Alert and oriented, but unclear attends if patient is confused Eyes: PERRLA, pink conjunctiva, no scleral icterus ENT: Moist oral mucosa, neck supple, no thyromegaly Lungs: clear to ascultation, no wheeze, no crackles, no use of accessory muscles Cardiovascular: regular rate and rhythm, no regurgitation, no gallops, no murmurs. No carotid bruits, no JVD Abdomen: soft, positive BS, non-tender, non-distended, no organomegaly, not an acute abdomen GU: not examined Neuro: CN II - XII grossly intact, sensation intact Musculoskeletal: strength 5/5 all extremities, no clubbing, cyanosis or edema Skin: no rash, no subcutaneous crepitation, no decubitus Psych: Circuitous patient   Labs on Admission:   Recent Labs  09/06/14 2302 09/07/14 0011  NA 139  141  K 6.7* 4.3  CL 108 108  CO2  --  24  GLUCOSE 99 93  BUN 30* 20  CREATININE 1.60* 1.49*  CALCIUM  --  9.5    Recent Labs  09/07/14 0011  AST 23  ALT 17  ALKPHOS 94  BILITOT 1.0  PROT 7.7  ALBUMIN 3.9   No results for input(s):  LIPASE, AMYLASE in the last 72 hours.  Recent Labs  09/06/14 2254 09/06/14 2302  WBC 6.8  --   NEUTROABS 3.3  --   HGB 16.8 19.7*  HCT 50.3 58.0*  MCV 88.9  --   PLT 188  --     Micro Results: No results found for this or any previous visit (from the past 240 hour(s)).   Radiological Exams on Admission: Dg Chest 2 View  09/07/2014   CLINICAL DATA:  64 year old male with altered mental status  EXAM: CHEST  2 VIEW  COMPARISON:  None.  FINDINGS: The heart size and mediastinal contours are within normal limits. Both lungs are clear. The visualized skeletal structures are unremarkable.  Surgical clips noted over the epigastric area.  IMPRESSION: No active cardiopulmonary disease.   Electronically Signed   By: Elgie Collard M.D.   On: 09/07/2014 00:28   Ct Head Wo Contrast  09/07/2014   CLINICAL DATA:  64 year old male with slurred speech  EXAM: CT HEAD WITHOUT CONTRAST  TECHNIQUE: Contiguous axial images were obtained from the base of the skull through the vertex without intravenous contrast.  COMPARISON:  None.  FINDINGS: The ventricles are dilated and the sulci are prominent compatible with age-related atrophy. Periventricular and deep white matter hypodensities represent chronic microvascular ischemic changes. There is no intracranial hemorrhage. No mass effect or midline shift identified.  The visualized paranasal sinuses and mastoid air cells are well aerated. The calvarium is intact.  IMPRESSION: No acute intracranial pathology.  Age-related atrophy and chronic microvascular ischemic disease.  If symptoms persist and there are no contraindications, MRI may provide better evaluation if clinically indicated.   Electronically Signed   By: Elgie Collard M.D.   On: 09/07/2014 00:31   EKG: a fib   Assessment/Plan Present on Admission:  . TIA (transient ischemic attack) . Atrial fibrillation -Admit to [facility] on Teleneuro floor -Patient seen by Dr. Hosie Poisson  -Imaging ordered as  recommended -Lipid panel, metoprolol -Aspirin 325 mg daily, would likely start Coumadin if MRI is negative for acute CVA Chronic kidney disease -Stable, at baseline Hyperkalemia - Repeat BMP with normal potassium, macular lab error   Uchechukwu Dhawan 09/07/2014, 3:10 AM

## 2014-09-07 NOTE — Progress Notes (Signed)
Patient admitted after midnight- please see H&P.  CVA -MRI + [place] VASc2-- at least 4-- suspect will need a newer anticoagulant OT rec SNF  Atrial fibrillation -neuro consult -Lipid panel, metoprolol -Aspirin 325 mg daily, would likely start Coumadin if MRI is negative for acute CVA - Chronic kidney disease -Stable, at baseline  Tobacco abuse -encourage cessation  Elevated troponin -cycle CE  Marlin Canary DO

## 2014-09-07 NOTE — Care Management Note (Signed)
Case Management Note  Patient Details  Name: Jeffrey Martin MRN: [id number] Date of Birth: [birth year]  Subjective/Objective:  64 y.o. M admitted with Acute non-hemorrhaagic Stroke. Pt lives in pvt residence with wife. PT/OT eval Pending. No documented Payor Source.Will confirm and refer to financial counselor if accurate.           Action/Plan: Will continue to follow.   Expected Discharge Date:                  Expected Discharge Plan:     In-House Referral:  Financial Counselor  Discharge planning Services  CM Consult  Post Acute Care Choice:    Choice offered to:     DME Arranged:    DME Agency:     HH Arranged:    HH Agency:     Status of Service:  In process, will continue to follow  Medicare Important Message Given:    Date Medicare IM Given:    Medicare IM give by:    Date Additional Medicare IM Given:    Additional Medicare Important Message give by:     If discussed at Long Length of Stay Meetings, dates discussed:    Additional Comments:  Yvone Neu, RN 09/07/2014, 11:07 AM

## 2014-09-08 DIAGNOSIS — R7989 Other specified abnormal findings of blood chemistry: Secondary | ICD-10-CM

## 2014-09-08 DIAGNOSIS — I429 Cardiomyopathy, unspecified: Secondary | ICD-10-CM

## 2014-09-08 DIAGNOSIS — I255 Ischemic cardiomyopathy: Secondary | ICD-10-CM | POA: Insufficient documentation

## 2014-09-08 LAB — GLUCOSE, CAPILLARY
GLUCOSE-CAPILLARY: 86 mg/dL (ref 65–99)
GLUCOSE-CAPILLARY: 90 mg/dL (ref 65–99)
Glucose-Capillary: 127 mg/dL — ABNORMAL HIGH (ref 65–99)
Glucose-Capillary: 105 mg/dL — ABNORMAL HIGH (ref 65–99)

## 2014-09-08 LAB — I-STAT CHEM 8, ED
BUN: 30 mg/dL — ABNORMAL HIGH (ref 6–20)
Calcium, Ion: 1.14 mmol/L (ref 1.13–1.30)
Chloride: 108 mmol/L (ref 101–111)
Creatinine, Ser: 1.6 mg/dL — ABNORMAL HIGH (ref 0.61–1.24)
Glucose, Bld: 99 mg/dL (ref 65–99)
HEMATOCRIT: 58 % — AB (ref 39.0–52.0)
Hemoglobin: 19.7 g/dL — ABNORMAL HIGH (ref 13.0–17.0)
Potassium: 6.7 mmol/L (ref 3.5–5.1)
SODIUM: 139 mmol/L (ref 135–145)
TCO2: 24 mmol/L (ref 0–100)

## 2014-09-08 LAB — HEMOGLOBIN A1C
Hgb A1c MFr Bld: 6.1 % — ABNORMAL HIGH (ref 4.8–5.6)
MEAN PLASMA GLUCOSE: 128 mg/dL

## 2014-09-08 MED ORDER — METOPROLOL SUCCINATE ER 25 MG PO TB24
50.0000 mg | ORAL_TABLET | Freq: Every day | ORAL | Status: DC
  Administered 2014-09-09 – 2014-09-10 (×2): 50 mg via ORAL
  Filled 2014-09-08 (×2): qty 2

## 2014-09-08 NOTE — Clinical Documentation Improvement (Signed)
Would you please indicate stage of CKD?   _______CKD Stage I - GFR > OR = 90 _______CKD Stage II - GFR 60-80 _______CKD Stage III - GFR 30-59 _______CKD Stage IV - GFR 15-29 _______CKD Stage V - GFR < 15 _______ESRD (End Stage Renal Disease) _______Other condition_____________ _______Cannot Clinically determine   Stated in medical record.  BUN on admission - 30 and creatinine - 1.6, GFR 47   Thank You, Harrie Jeans ,RN Clinical Documentation Specialist:    [facility]- Health Information Management [phone] Cell [phone]

## 2014-09-08 NOTE — Progress Notes (Signed)
OT NOTE  Wife name : Windell Moulding [phone]   Mateo Flow   OTR/L Pager: [phone] Office: [phone] .

## 2014-09-08 NOTE — Progress Notes (Signed)
Patients NIH has increased by 3 for new onset of Apasia, answer to questions, alertness Dr Benjamine Mola is aware, will continue to monitor.

## 2014-09-08 NOTE — Progress Notes (Signed)
STROKE TEAM PROGRESS NOTE   HISTORY Jeffrey Martin is an 64 y.o. male history of A fib presenting with 2 day history of speech difficulty and left sided weakness. Family reports acute onset 2 days ago, they note his speech has been different and his left side appears weaker with question of left facial droop. No sensory or visual deficits. No prior CVA or TIA history. No recent illness, no falls or head trauma. Ethanol and UDS negative.  Recently diagnosed with A fib, not on anticoagulation. CT head imaging reviewed, shows no acute process. 2D echo on 6/27 with EF 35 to 40%. He was last known well 7/18 at 1200. Modified Rankin: Rankin Score=0. Patient was not administered TPA secondary to outside tPA window. He was admitted for further evaluation and treatment.   SUBJECTIVE (INTERVAL HISTORY) Patient without complaints. On acute event overnight. 2D echo showed EF 30-35%. Cardiology consult requested.   OBJECTIVE Temp:  [97.5 F (36.4 C)-98.4 F (36.9 C)] 98.4 F (36.9 C) (07/21 0608) Pulse Rate:  [44-93] 67 (07/21 0608) Cardiac Rhythm:  [-] Atrial fibrillation (07/20 1945) Resp:  [16-20] 20 (07/21 0608) BP: (135-163)/(98-135) 163/115 mmHg (07/21 0608) SpO2:  [97 %-100 %] 100 % (07/21 0608)   Recent Labs Lab 09/07/14 0745 09/07/14 1103 09/07/14 1610 09/07/14 2134 09/08/14 0625  GLUCAP 96 99  99 101* 103* 86    Recent Labs Lab 09/06/14 2302 09/07/14 0011 09/07/14 0525  NA 139 141 139  K 6.7* 4.3 4.2  CL 108 108 107  CO2  --  24 22  GLUCOSE 99 93 93  BUN 30* 20 18  CREATININE 1.60* 1.49* 1.29*  CALCIUM  --  9.5 9.1    Recent Labs Lab 09/07/14 0011  AST 23  ALT 17  ALKPHOS 94  BILITOT 1.0  PROT 7.7  ALBUMIN 3.9    Recent Labs Lab 09/06/14 2254 09/06/14 2302 09/07/14 0808  WBC 6.8  --  6.0  NEUTROABS 3.3  --   --   HGB 16.8 19.7* 16.3  HCT 50.3 58.0* 47.7  MCV 88.9  --  87.0  PLT 188  --  159    Recent Labs Lab 09/07/14 1048 09/07/14 1714  09/07/14 2212  TROPONINI 0.35* 0.27* 0.21*    Recent Labs  09/07/14 0011  LABPROT 15.0  INR 1.16    Recent Labs  09/07/14 0022  COLORURINE YELLOW  LABSPEC 1.019  PHURINE 5.5  GLUCOSEU NEGATIVE  HGBUR NEGATIVE  BILIRUBINUR NEGATIVE  KETONESUR NEGATIVE  PROTEINUR NEGATIVE  UROBILINOGEN 1.0  NITRITE NEGATIVE  LEUKOCYTESUR SMALL*       Component Value Date/Time   CHOL 135 09/07/2014 0525   TRIG 50 09/07/2014 0525   HDL 48 09/07/2014 0525   CHOLHDL 2.8 09/07/2014 0525   VLDL 10 09/07/2014 0525   LDLCALC 77 09/07/2014 0525   Lab Results  Component Value Date   HGBA1C 6.1* 09/07/2014      Component Value Date/Time   LABOPIA NONE DETECTED 09/07/2014 1352   COCAINSCRNUR NONE DETECTED 09/07/2014 1352   LABBENZ NONE DETECTED 09/07/2014 1352   AMPHETMU NONE DETECTED 09/07/2014 1352   THCU NONE DETECTED 09/07/2014 1352   LABBARB NONE DETECTED 09/07/2014 1352     Recent Labs Lab 09/07/14 0011  ETH <5    Dg Chest 2 View 09/07/2014    No active cardiopulmonary disease.     Ct Head Wo Contrast 09/07/2014    No acute intracranial pathology.  Age-related atrophy and chronic microvascular ischemic disease.  MRI HEAD   09/07/2014    Exam is motion degraded.  Acute nonhemorrhagic infarct right lenticular nucleus with extension into the posterior limb of the right internal capsule and the posterior aspect of the right corona radiata. Smaller tiny acute infarct anterior right corona radiata, posterior right peri operculum region and right temporal lobe. Tiny acute infarct peripheral aspect of the junction of the right frontal- parietal lobe.  No intracranial hemorrhage.  Mild to moderate small vessel disease type changes.  Plaque-like calcification/ossification left aspect of the falx. 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 Otherwise no evidence of intracranial mass detected on this unenhanced exam.  Global atrophy without  hydrocephalus.  C3-4 bulge/protrusion with cord flattening.    MRA HEAD   09/07/2014    Exam is motion degraded.  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 Collateral flow to the right carotid terminus.  No high-grade stenosis of the M1 segment of either middle cerebral artery. Poor delineation of majority of middle cerebral artery branch vessels bilaterally more notable on the right.  Ectatic anterior cerebral arteries. A tiny anterior communicating artery aneurysm cannot be excluded as questioned on source images. This could be reassessed on follow-up when patient is better able to cooperate.  Left vertebral artery slightly dominant in size. No significant stenosis of the distal vertebral arteries.  Mild to moderate narrowing mid aspect of the basilar artery. Nonvisualized anterior inferior cerebral arteries.     CTA head and neck - 1. Occlusive embolus to the supraclinoid right ICA with good downstream flow via this anterior communicating artery. The right cervical ICA is likely patent, with delayed filling seen at the skullbase. 2. Mild atheromatous change, mainly at the right carotid bifurcation.  2D echo - - Left ventricle: The cavity size was normal. Systolic function was moderately to severely reduced. The estimated ejection fractionwas in the range of 30% to 35%. Wall motion was normal; therewere no regional wall motion abnormalities. - Ascending aorta: The ascending aorta was mildly dilated measuring 40 mm. - Mitral valve: There was moderate regurgitation. - Left atrium: The atrium was severely dilated. - Right ventricle: Systolic function was normal. - Right atrium: The atrium was moderately dilated. - Tricuspid valve: There was moderate regurgitation. - Pulmonary arteries: Systolic pressure was mildly increased. PA peak pressure: 39 mm Hg (S). - Inferior vena cava: The vessel was normal in size. - Pericardium, extracardiac: There was no pericardial effusion. Impressions: There is  no significant difference when compared to the priorstudy from 08/15/2014.   PHYSICAL EXAM  Temp:  [97.5 F (36.4 C)-98.4 F (36.9 C)] 98.4 F (36.9 C) (07/21 9562) Pulse Rate:  [44-93] 67 (07/21 0608) Resp:  [16-20] 20 (07/21 0608) BP: (135-163)/(98-135) 163/115 mmHg (07/21 0608) SpO2:  [97 %-100 %] 100 % (07/21 0608)  General - Well nourished, well developed, in no apparent distress, sleepy initially.  Ophthalmologic - Fundi not visualized due to eye and head movement.  Cardiovascular - irregularly irregular heart rate and rhythm.  Mental Status -  Level of arousal and orientation to time, place, and person were intact. Language including expression, repetition, comprehension was assessed and found intact, but naming 2/4. Mild psychomotor slowing and paucity of speech.   Cranial Nerves II - XII - II - Visual field intact OU. III, IV, VI - Extraocular movements intact. V - Facial sensation intact bilaterally. VII - left facial droop. VIII - Hearing & vestibular intact bilaterally. X - Palate elevates symmetrically. XI - Chin turning &  shoulder shrug intact bilaterally. XII - Tongue protrusion intact.  Motor Strength - The patient's strength was normal in all extremities except left hand mild dexterity difficulty. Pronator drift was absent.  Bulk was normal and fasciculations were absent.   Motor Tone - Muscle tone was assessed at the neck and appendages and was normal.  Reflexes - The patient's reflexes were 1+ in all extremities and he had no pathological reflexes.  Sensory - Light touch, temperature/pinprick were assessed and were symmetrical.    Coordination - The patient had normal movements in the hands and feet with no ataxia or dysmetria.  Tremor was absent.  Gait and Station - deferred due to sleepiness.   ASSESSMENT/PLAN Mr. Damyn Weitzel is a 64 y.o. male with history of atrial fibrillation presenting with left sided weakness and left facial droop. He did  not receive IV t-PA due to delay in arrival.   Stroke:  Right lenticular nucleaus/PLIC/corona radiata infarct as well as tiny R corona radiatia, poster R opercular and R temporal lobe infarcts, embolic secondary to known atrial fibrillation   Resultant  Left facial droop and mild LUE weakness  MRI  right lenticular nucleaus/PLIC/corona radiata infarct as well as tiny R corona radiatia, poster R opercular and R temporal lobe infarcts,   MRA  R ICA occlusion  CTA head and neck showed right distal ICA occlusion  2D Echo  EF 30-35%  LDL 77  HgbA1c 6.1  On Eliquis for VTE prophylaxis  aspirin 81 mg orally every day prior to admission, changed to  eliquis (apixaban) for stroke prevention. ASA discontinued.   Patient counseled to be compliant with his antithrombotic medications  Ongoing aggressive stroke risk factor management  Therapy recommendations:  SNF  Disposition:  pending   Atrial Fibrillation  Home anticoagulation:  None  CHA2DS2-VASc Score = 2, ?2 oral anticoagulation recommended  Age in Years:  <65   0    Sex:  Male   0    Hypertension History:  0     Diabetes Mellitus:  0   Congestive Heart Failure History:  0  Vascular Disease History:  0     Stroke/TIA/Thromboembolism History:  yes   +2  Started on eliquis 5mg  bid 7/20  Cardiomyopathy  EF 30-35% on echo  Pt may need stress test and cardiac cath  Pt has cardiology follow up as outpt  Cardiology consult pending   Hypertension  Home meds:   metoprolol  Now on metoprolol and amlodipine  Permissive hypertension (OK if <220/120) for 24-48 hours post stroke and then gradually normalized within 5-7 days.  Patient counseled to be compliant with his blood pressure medications  Hyperlipidemia  Home meds:  none  LDL 77, goal < 70  Add zocor 20mg    Continue statin at discharge  Tobacco abuse  Current smoker  Smoking cessation counseling provided  Pt is willing to quit  Other Stroke Risk  Factors  ETOH use  Other Active Problems  CKD - Cre 1.29, close monitoring and IVF  Hyperkalemia - supplement  NOTHING FURTHER TO ADD FROM THE STROKE STANDPOINT  Patient has a 10-15% risk of having another stroke over the next year, the highest risk is within 2 weeks of the most recent stroke/TIA (risk of having a stroke following a stroke or TIA is the same).  Ongoing risk factor control by Primary Care Physician  Stroke Service will sign off. Please call should any needs arise.  Follow-up Stroke Clinic at [facility] with Dr.  Tereza Gilham in 2 months, order placed.  Hospital day # 1   Neurology will sign off. Please call with questions. Pt will follow up with Dr. Roda Shutters at [facility] in about 2 months. Thanks for the consult.  Marvel Plan, MD PhD Stroke Neurology 09/08/2014 3:36 PM    To contact Stroke Continuity provider, please refer to [email or web address]. After hours, contact General Neurology

## 2014-09-08 NOTE — Progress Notes (Signed)
Physical Therapy Treatment Patient Details Name: Jeffrey Martin MRN: [id number] DOB: 1950/08/20 Today's Date: 09/08/2014    History of Present Illness 64 yo male admitted with weakness. Tranfered from [facility] to [facility]. CT (-) MRI (+) R internal carotid artery occluded, acute R lenticular nucleus with extension into the posterior limb of the right internal capsule and posterior aspect of the right corona radiata, posterior right peri operculum region and R temporal lobe. tiny acute R frontal parietal lobe infarct PMH: peptic ulcer, iregular heart beat, smokes, alcohol     PT Comments    Progressing towards PT goals. Physically Pt has improved since last therapy session. Pt was able to ambulate 180 ft without an AD however pt does still seem mildly impulsive and unsteady. Pt can continue to benefit from skilled therapy to improve pts strength and balance need for DC destination. Communicated with RN about pt's BP after therapy session which was 168/106.  Follow Up Recommendations  SNF;Supervision/Assistance - 24 hour     Equipment Recommendations   (TBD)    Recommendations for Other Services       Precautions / Restrictions Precautions Precautions: Fall Restrictions Weight Bearing Restrictions: No    Mobility  Bed Mobility Overal bed mobility: Modified Independent             General bed mobility comments: pt used bed railings to sit up in bed  Transfers Overall transfer level: Needs assistance Equipment used: Rolling walker (2 wheeled) Transfers: Sit to/from Stand Sit to Stand: Supervision         General transfer comment: Pt was able to preform transfers with supervison A from therapist. Dellis Filbert VC's were given for hand placement.  Ambulation/Gait Ambulation/Gait assistance: Supervision Ambulation Distance (Feet): 280 Feet Assistive device: Rolling walker (2 wheeled);None (pt walk 100 ft with RW then transitioned to no AD ) Gait Pattern/deviations: Step-through  pattern;Decreased stride length;Narrow base of support;Drifts right/left Gait velocity: slow Gait velocity interpretation: Below normal speed for age/gender General Gait Details: Pt walked 180 ft with no AD and therapist giving Supervion A for saftey. VC for safety with transition from use of AD to no assitive device. Somewhat impulsive and confused with what to do with RW. VC to stop and recollect himself before continuing ambulatory bout. Intermittently drifting to Lt without assistive device.   Stairs            Wheelchair Mobility    Modified Rankin (Stroke Patients Only) Modified Rankin (Stroke Patients Only) Pre-Morbid Rankin Score: No symptoms Modified Rankin: Moderately severe disability     Balance Overall balance assessment: Needs assistance Sitting-balance support: Feet supported;No upper extremity supported Sitting balance-Leahy Scale: Good     Standing balance support: No upper extremity supported;During functional activity Standing balance-Leahy Scale: Fair Standing balance comment: pt able to ambulate with no AD but still demos some unsteadness             High level balance activites: Side stepping;Direction changes;Turns;Sudden stops;Head turns (no LOB)      Cognition Arousal/Alertness: Awake/alert Behavior During Therapy: Flat affect Overall Cognitive Status: Impaired/Different from baseline Area of Impairment: Attention;Following commands;Awareness;Problem solving   Current Attention Level: Alternating   Following Commands: Follows multi-step commands consistently   Awareness: Emergent Problem Solving: Slow processing;Decreased initiation;Requires verbal cues General Comments: Pt was oriented through out therapy session. pt able to navigate hallway back to room with no directional help but required additonal time and effort to find room.    Exercises      General  Comments General comments (skin integrity, edema, etc.): pt HR at start of  session was 97bpm. At end of session therapsit took BP. In R arm BP was 163/138 and in L arm 168/138. Told RN of findings.      Pertinent Vitals/Pain Pain Assessment: No/denies pain    Home Living                      Prior Function            PT Goals (current goals can now be found in the care plan section) Acute Rehab PT Goals Patient Stated Goal: None stated PT Goal Formulation: Patient unable to participate in goal setting Time For Goal Achievement: 09/21/14 Potential to Achieve Goals: Good Progress towards PT goals: Progressing toward goals    Frequency  Min 4X/week    PT Plan Current plan remains appropriate;Other (comment) (informed of vitals)    Co-evaluation             End of Session Equipment Utilized During Treatment:  (RW for 100 ft) Activity Tolerance: Patient tolerated treatment well Patient left: in bed;with call bell/phone within reach;with bed alarm set     Time: 1610-9604 PT Time Calculation (min) (ACUTE ONLY): 18 min  Charges:  $Gait Training: 8-22 mins                    G Codes:      Carren Rang Oct 02, 2014, 4:08 PM   Carren Rang, Leda Gauze (student physical therapy assistant) Acute Rehab [phone]

## 2014-09-08 NOTE — Discharge Instructions (Signed)

## 2014-09-08 NOTE — Progress Notes (Signed)
Occupational Therapy Treatment Patient Details Name: Jeffrey Martin MRN: [id number] DOB: [birth year] Today's Date: 09/08/2014    History of present illness 64 yo male admitted with weakness. Tranfered from [facility] to [facility]. CT (-) MRI (+) R internal carotid artery occluded, acute R lenticular nucleus with extension into the posterior limb of the right internal capsule and posterior aspect of the right corona radiata, posterior right peri operculum region and R temporal lobe. tiny acute R frontal parietal lobe infarct PMH: peptic ulcer, iregular heart beat, smokes, alcohol    OT comments  Pt able to verbalize needs (desire for food) and provide phone number for wife. Pt ambulated in hallway, and was able to verbalize room number and path find to locate room; no LOB with head turns or direction changes. Limited home and caregiver information known at this time; continue to recommend SNF and 24/7 supervision for cognition and increased safety and independence with ADLs.   Follow Up Recommendations  Supervision/Assistance - 24 hour;SNF    Equipment Recommendations  Other (comment) (TBD)    Recommendations for Other Services      Precautions / Restrictions Precautions Precautions: Fall Restrictions Weight Bearing Restrictions: No       Mobility Bed Mobility Overal bed mobility: Modified Independent                Transfers Overall transfer level: Modified independent Equipment used: None Transfers: Sit to/from Stand Sit to Stand: Supervision              Balance Overall balance assessment: No apparent balance deficits (not formally assessed) Sitting-balance support: Feet supported;No upper extremity supported       Standing balance support: No upper extremity supported;During functional activity                 High level balance activites: Side stepping;Direction changes;Turns;Sudden stops;Head turns (no LOB)     ADL                                                 Vision                     Perception     Praxis      Cognition   Behavior During Therapy: Flat affect Overall Cognitive Status: Within Functional Limits for tasks assessed                       Extremity/Trunk Assessment               Exercises     Shoulder Instructions       General Comments      Pertinent Vitals/ Pain       Pain Assessment: No/denies pain  Home Living                                          Prior Functioning/Environment              Frequency Min 3X/week     Progress Toward Goals  OT Goals(current goals can now be found in the care plan section)  Progress towards OT goals: Progressing toward goals  Acute Rehab OT Goals Patient Stated Goal: None stated OT Goal Formulation: With patient Time For  Goal Achievement: 09/21/14 ADL Goals Pt Will Perform Grooming: with supervision;standing Pt Will Perform Upper Body Dressing: with modified independence;sitting Pt Will Perform Lower Body Dressing: with modified independence;sit to/from stand Pt Will Perform Tub/Shower Transfer: Tub transfer;with min guard assist;ambulating  Plan Discharge plan remains appropriate    Co-evaluation                 End of Session Equipment Utilized During Treatment: Gait belt   Activity Tolerance Patient tolerated treatment well   Patient Left in bed;with call bell/phone within reach   Nurse Communication Mobility status        Time: 1350-1405 OT Time Calculation (min): 15 min  Charges: OT General Charges $OT Visit: 1 Procedure OT Treatments $Therapeutic Activity: 8-22 mins  Marden Noble 09/08/2014, 2:32 PM

## 2014-09-08 NOTE — Consult Note (Signed)
CARDIOLOGY CONSULT NOTE  Patient ID: Jeffrey Martin MRN: [id number] DOB/AGE: 06/28/1950 64 y.o.  Admit date: 09/06/2014 Primary Physician No PCP Per Patient Primary Cardiologist None Chief Complaint  Elevated cardiac enzyme.  HPI:  The patient was admitted yesterday with AMS.  He has recently diagnosed atrial fib although he has not been on NOAC or warfarin.  The patient is noted to have an elevated troponin.  Echo demonstrated a reduced EF of 35% with moderate MR.  Head CT showed no acute findings.  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 Smaller tiny acute infarct anterior right corona radiata, posterior right peri operculum region and right temporal lobe. Tiny acute infarct peripheral aspect of the junction of the right frontal- parietal lobe. No intracranial hemorrhage.  He was started on Eliquis.  We are called because of the elevated troponin and reduced EF.    The patient is not answering questions except by shaking his head.  He reports no pain or SOB.  He denies any recent cardiovascular symptoms.  I tried to call his contact number but there was no answer.  Staff has not been able to contact family.  I see no past cardiac history.   Past Medical History  Diagnosis Date  . Peptic ulcer   . Irregular heart beat   . Hypertension     Past Surgical History  Procedure Laterality Date  . Repair of perforated ulcer      No Known Allergies Prescriptions prior to admission  Medication Sig Dispense Refill Last Dose  . aspirin 81 MG chewable tablet Chew 4 tablets (324 mg total) by mouth daily. 120 tablet 0 09/06/2014 at Unknown time  . Aspirin-Salicylamide-Caffeine (BC HEADACHE POWDER PO) Take 1 Package by mouth daily as needed (pain).   PRN  . metoprolol succinate (TOPROL-XL) 25 MG 24 hr tablet Take 1 tablet (25 mg total) by mouth  daily. 30 tablet 6 09/06/2014 at 0730  . Multiple Vitamins-Minerals (MULTIVITAMIN & MINERAL PO) Take 1 tablet by mouth daily.   09/05/2014 at Unknown time   History reviewed. No pertinent family history.  History   Social History  . Marital Status: Legally Separated    Spouse Name: N/A  . Number of Children: N/A  . Years of Education: N/A   Occupational History  . Not on file.   Social History Main Topics  . Smoking status: Current Every Day Smoker -- 0.30 packs/day    Types: Cigarettes  . Smokeless tobacco: Not on file  . Alcohol Use: Yes     Comment: rare  . Drug Use: No  . Sexual Activity: Not on file   Other Topics Concern  . 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    Physical Exam: Blood pressure 163/115, pulse 67, temperature 98.4 F (36.9 C), temperature source Oral, resp. rate 20, height 5' 7.5" (1.715 m), weight 152 lb 3.2 oz (69.037 kg), SpO2 100 %.  GENERAL:  Well appearing HEENT:  Pupils equal round and reactive, fundi not visualized, oral mucosa unremarkable, poor dentition NECK:  No jugular venous distention, waveform within normal limits, carotid upstroke brisk and symmetric, no bruits, no thyromegaly LYMPHATICS:  No cervical, inguinal adenopathy LUNGS:  Clear to auscultation bilaterally BACK:  No CVA tenderness CHEST:  Unremarkable HEART:  PMI not displaced or sustained,S1  and S2 within normal limits, no S3, no clicks, no rubs,  Murmurs, irregular ABD:  Flat, positive bowel sounds normal in frequency in pitch, no bruits, no rebound, no guarding, no midline pulsatile mass, no hepatomegaly, no splenomegaly EXT:  2 plus pulses throughout, no edema, no cyanosis no clubbing SKIN:  No rashes no nodules NEURO:  Cranial nerves II through XII grossly intact, motor grossly intact throughout PSYCH:  Does not answer questions    Labs: Lab Results  Component Value Date   BUN 18 09/07/2014   Lab Results  Component  Value Date   CREATININE 1.29* 09/07/2014   Lab Results  Component Value Date   NA 139 09/07/2014   K 4.2 09/07/2014   CL 107 09/07/2014   CO2 22 09/07/2014   Lab Results  Component Value Date   TROPONINI 0.21* 09/07/2014   Lab Results  Component Value Date   WBC 6.0 09/07/2014   HGB 16.3 09/07/2014   HCT 47.7 09/07/2014   MCV 87.0 09/07/2014   PLT 159 09/07/2014   Lab Results  Component Value Date   CHOL 135 09/07/2014   HDL 48 09/07/2014   LDLCALC 77 09/07/2014   TRIG 50 09/07/2014   CHOLHDL 2.8 09/07/2014   Lab Results  Component Value Date   ALT 17 09/07/2014   AST 23 09/07/2014   ALKPHOS 94 09/07/2014   BILITOT 1.0 09/07/2014   EKG:  Atrial fib, rate 109, LVH with repolarization.  09/06/14  ECHO: - Left ventricle: The cavity size was normal. Systolic function was moderately to severely reduced. The estimated ejection fraction was in the range of 30% to 35%. Wall motion was normal; there were no regional wall motion abnormalities. - Ascending aorta: The ascending aorta was mildly dilated measuring 40 mm. - Mitral valve: There was moderate regurgitation. - Left atrium: The atrium was severely dilated. - Right ventricle: Systolic function was normal. - Right atrium: The atrium was moderately dilated. - Tricuspid valve: There was moderate regurgitation. - Pulmonary arteries: Systolic pressure was mildly increased. PA peak pressure: 39 mm Hg (S). - Inferior vena cava: The vessel was normal in size. - Pericardium, extracardiac: There was no pericardial effusion.   ASSESSMENT AND PLAN:   CARDIOMYOPATHY:  I suspect that this is related to uncontrolled HTN.  To exclude an ischemic etiology he would need a Lexiscan Myoview.  However, this can be deferred until his BP is better controlled.  We can do this as an outpatient.  The mild troponin elevation can represent HTN, rapid atrial fib.  He has no active ischemic symptoms as far as I can tell  ELEVATED  TROPONIN:  As above.    ATRIAL FIB:  I will increase the beta blocker slightly.  He is on Eliquis.  We will need to have neurology input as to how long they want him to remain on 235 mg ASA in addition to Eliquis.    HTN:  The plan is for permissive HTN followed by gradual titration of medications for better rate control.  Increase beta blocker as above.   CKD:  I suspect that overtime his creat will allow med titration.  However, increase beta blocker slightly first.   TOBACCO ABUSE:  Per primary team  SOCIAL:  Considering NHP.    SignedRollene Rotunda 09/08/2014, 1:29 PM

## 2014-09-08 NOTE — Progress Notes (Signed)
PROGRESS NOTE  Jeffrey Martin:[id number] DOB: 1950-07-15 DOA: 09/06/2014 PCP: No PCP Per Patient  Assessment/Plan: CVA -MRI + [place] VASc2-- at least 4--added eliquis SNF- per PT SLP eval CTA head and neck showed right distal ICA occlusion  Atrial fibrillation -neuro consult -Lipid panel- add statin, metoprolol  Chronic kidney disease -Stable, at baseline -trend  Tobacco abuse -encourage cessation  Elevated troponin -add cards to see as EF decreased, in a fib, and elevated troponin  Code Status: full Family Communication: patient- LM for son Disposition Plan:    Consultants:  Neuro  cards  Procedures:      HPI/Subjective: Awake, has difficulty speaking  Objective: Filed Vitals:   09/08/14 0608  BP: 163/115  Pulse: 67  Temp: 98.4 F (36.9 C)  Resp: 20    Intake/Output Summary (Last 24 hours) at 09/08/14 1249 Last data filed at 09/08/14 0612  Gross per 24 hour  Intake      0 ml  Output    100 ml  Net   -100 ml   Filed Weights   09/07/14 0434  Weight: 69.037 kg (152 lb 3.2 oz)    Exam:   General:  Awake- has difficulty speaking- left facial droop  Cardiovascular: irr  Respiratory: clear  Abdomen: +BS, soft  Musculoskeletal: no edema   Data Reviewed: Basic Metabolic Panel:  Recent Labs Lab 09/06/14 2302 09/07/14 0011 09/07/14 0525  NA 139 141 139  K 6.7* 4.3 4.2  CL 108 108 107  CO2  --  24 22  GLUCOSE 99 93 93  BUN 30* 20 18  CREATININE 1.60* 1.49* 1.29*  CALCIUM  --  9.5 9.1   Liver Function Tests:  Recent Labs Lab 09/07/14 0011  AST 23  ALT 17  ALKPHOS 94  BILITOT 1.0  PROT 7.7  ALBUMIN 3.9   No results for input(s): LIPASE, AMYLASE in the last 168 hours. No results for input(s): AMMONIA in the last 168 hours. CBC:  Recent Labs Lab 09/06/14 2254 09/06/14 2302 09/07/14 0808  WBC 6.8  --  6.0  NEUTROABS 3.3  --   --   HGB 16.8 19.7* 16.3  HCT 50.3 58.0* 47.7  MCV 88.9  --  87.0  PLT 188  --   159   Cardiac Enzymes:  Recent Labs Lab 09/07/14 1048 09/07/14 1714 09/07/14 2212  TROPONINI 0.35* 0.27* 0.21*   BNP (last 3 results) No results for input(s): BNP in the last 8760 hours.  ProBNP (last 3 results) No results for input(s): PROBNP in the last 8760 hours.  CBG:  Recent Labs Lab 09/07/14 1103 09/07/14 1610 09/07/14 2134 09/08/14 0625 09/08/14 1129  GLUCAP 99  99 101* 103* 86 90    No results found for this or any previous visit (from the past 240 hour(s)).   Studies: Ct Angio Head W/cm &/or Wo Cm  09/07/2014   CLINICAL DATA:  Stroke and ICA occlusion.  Atrial fibrillation  EXAM: CT ANGIOGRAPHY HEAD AND NECK  TECHNIQUE: Multidetector CT imaging of the head and neck was performed using the standard protocol during bolus administration of intravenous contrast. Multiplanar CT image reconstructions and MIPs were obtained to evaluate the vascular anatomy. Carotid stenosis measurements (when applicable) are obtained utilizing NASCET criteria, using the distal internal carotid diameter as the denominator.  CONTRAST:  80mL OMNIPAQUE IOHEXOL 350 MG/ML SOLN  COMPARISON:  Brain MRI from earlier the same day  FINDINGS: CTA NECK  Aortic arch: No aneurysm or dissection.  Standard branching.  Right  carotid system: Patent brachiocephalic artery. Just beyond the ICA bifurcation there is graded nonvisualization of the lumen contrast. Mild calcified atherosclerotic plaque is present at the ICA bifurcation. No discrete dissection flap or ulcerated plaque is seen. There is intracranial reconstitution. Note that the upper right cervical ICA is visible on delayed phase, but there is no opacification at the level of the carotid terminus, suggesting there is focal thrombosis at this level with delayed enhancement proximally. Further discussed below.  Left carotid system: No stenosis or discrete atheromatous change. Tortuous ICA without changes of fibromuscular dysplasia.  Vertebral arteries:Mild  left dominance. The vessels are widely patent into the head. No evidence of dissection or other acute disease.  Skeleton: No contributory findings.  Other neck: No significant incidental finding.  CTA HEAD  Anterior circulation: Intracranial right ICA occlusion with opacification around a finger like projection suggesting recent clot/embolus. There is reconstituted flow in the right M1 segment via a broad anterior communicating artery (no aneurysm seen in this location as questioned on previous MRA). No downstream major branch occlusion is seen. No significant stenosis seen in the anterior cerebral or left middle cerebral branches.  Posterior circulation: No stenosis, aneurysm, or major branch occlusion.  Venous sinuses: Patent as permitted by timing.  Delayed phase: Enhancement of the upper cervical right ICA, as discussed above.  Low-density in the posterior limb right internal capsule and neighboring putamen correlating with infarct seen on preceding brain MRI. Other infarcts are not well visualized. There is generalized cerebral volume loss which is greater than expected for age, with chronic small vessel ischemia best visualized around the lateral ventricles.  These results were called by telephone at the time of interpretation on 09/07/2014 at 2:05 pm to Dr. Marvel Plan , who verbally acknowledged these results.  IMPRESSION: 1. Occlusive embolus to the supraclinoid right ICA with good downstream flow via this anterior communicating artery. The right cervical ICA is likely patent, with delayed filling seen at the skullbase. 2. Mild atheromatous change, mainly at the right carotid bifurcation.   Electronically Signed   By: Marnee Spring M.D.   On: 09/07/2014 14:09   Dg Chest 2 View  09/07/2014   CLINICAL DATA:  64 year old male with altered mental status  EXAM: CHEST  2 VIEW  COMPARISON:  None.  FINDINGS: The heart size and mediastinal contours are within normal limits. Both lungs are clear. The visualized  skeletal structures are unremarkable.  Surgical clips noted over the epigastric area.  IMPRESSION: No active cardiopulmonary disease.   Electronically Signed   By: Elgie Collard M.D.   On: 09/07/2014 00:28   Ct Head Wo Contrast  09/07/2014   CLINICAL DATA:  64 year old male with slurred speech  EXAM: CT HEAD WITHOUT CONTRAST  TECHNIQUE: Contiguous axial images were obtained from the base of the skull through the vertex without intravenous contrast.  COMPARISON:  None.  FINDINGS: The ventricles are dilated and the sulci are prominent compatible with age-related atrophy. Periventricular and deep white matter hypodensities represent chronic microvascular ischemic changes. There is no intracranial hemorrhage. No mass effect or midline shift identified.  The visualized paranasal sinuses and mastoid air cells are well aerated. The calvarium is intact.  IMPRESSION: No acute intracranial pathology.  Age-related atrophy and chronic microvascular ischemic disease.  If symptoms persist and there are no contraindications, MRI may provide better evaluation if clinically indicated.   Electronically Signed   By: Elgie Collard M.D.   On: 09/07/2014 00:31   Ct Angio Neck W/cm &/  or Wo/cm  09/07/2014   CLINICAL DATA:  Stroke and ICA occlusion.  Atrial fibrillation  EXAM: CT ANGIOGRAPHY HEAD AND NECK  TECHNIQUE: Multidetector CT imaging of the head and neck was performed using the standard protocol during bolus administration of intravenous contrast. Multiplanar CT image reconstructions and MIPs were obtained to evaluate the vascular anatomy. Carotid stenosis measurements (when applicable) are obtained utilizing NASCET criteria, using the distal internal carotid diameter as the denominator.  CONTRAST:  80mL OMNIPAQUE IOHEXOL 350 MG/ML SOLN  COMPARISON:  Brain MRI from earlier the same day  FINDINGS: CTA NECK  Aortic arch: No aneurysm or dissection.  Standard branching.  Right carotid system: Patent brachiocephalic artery.  Just beyond the ICA bifurcation there is graded nonvisualization of the lumen contrast. Mild calcified atherosclerotic plaque is present at the ICA bifurcation. No discrete dissection flap or ulcerated plaque is seen. There is intracranial reconstitution. Note that the upper right cervical ICA is visible on delayed phase, but there is no opacification at the level of the carotid terminus, suggesting there is focal thrombosis at this level with delayed enhancement proximally. Further discussed below.  Left carotid system: No stenosis or discrete atheromatous change. Tortuous ICA without changes of fibromuscular dysplasia.  Vertebral arteries:Mild left dominance. The vessels are widely patent into the head. No evidence of dissection or other acute disease.  Skeleton: No contributory findings.  Other neck: No significant incidental finding.  CTA HEAD  Anterior circulation: Intracranial right ICA occlusion with opacification around a finger like projection suggesting recent clot/embolus. There is reconstituted flow in the right M1 segment via a broad anterior communicating artery (no aneurysm seen in this location as questioned on previous MRA). No downstream major branch occlusion is seen. No significant stenosis seen in the anterior cerebral or left middle cerebral branches.  Posterior circulation: No stenosis, aneurysm, or major branch occlusion.  Venous sinuses: Patent as permitted by timing.  Delayed phase: Enhancement of the upper cervical right ICA, as discussed above.  Low-density in the posterior limb right internal capsule and neighboring putamen correlating with infarct seen on preceding brain MRI. Other infarcts are not well visualized. There is generalized cerebral volume loss which is greater than expected for age, with chronic small vessel ischemia best visualized around the lateral ventricles.  These results were called by telephone at the time of interpretation on 09/07/2014 at 2:05 pm to Dr. Marvel Plan  , who verbally acknowledged these results.  IMPRESSION: 1. Occlusive embolus to the supraclinoid right ICA with good downstream flow via this anterior communicating artery. The right cervical ICA is likely patent, with delayed filling seen at the skullbase. 2. Mild atheromatous change, mainly at the right carotid bifurcation.   Electronically Signed   By: Marnee Spring M.D.   On: 09/07/2014 14:09   Mri Brain Without Contrast  09/07/2014   CLINICAL DATA:  64 year old male with atrial fibrillation with 2 day history of speech difficulty and left-sided weakness. Subsequent encounter.  EXAM: MRI HEAD WITHOUT CONTRAST  MRA HEAD WITHOUT CONTRAST  TECHNIQUE: Multiplanar, multiecho pulse sequences of the brain and surrounding structures were obtained without intravenous contrast. Angiographic images of the head were obtained using MRA technique without contrast.  COMPARISON:  09/06/2014 head CT.  No comparison brain MR.  FINDINGS: MRI HEAD FINDINGS  Exam is motion degraded.  Acute nonhemorrhagic infarct right lenticular nucleus with extension into the posterior limb of the right internal capsule and the posterior aspect of the right corona radiata. Smaller tiny acute infarct anterior  right corona radiata, posterior right peri operculum region and right temporal lobe (lateral to the anterior aspect of the right temporal horn). Tiny acute infarct peripheral aspect of the junction of the right frontal- parietal lobe. No intracranial hemorrhage.  Mild to moderate small vessel disease type changes.  Abnormal appearance of the right internal carotid artery as noted below.  Plaque-like calcification/ossification left aspect of the falx. 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 Otherwise no evidence of intracranial mass detected on this unenhanced exam.  Global atrophy without hydrocephalus.  C3-4 bulge/protrusion with cord flattening.  Pituitary region, pineal region and orbital  structures unremarkable.  Minimal mucosal thickening inferior left maxillary sinus.  MRA HEAD FINDINGS  Exam is motion degraded.  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 Collateral flow to the right carotid terminus.  No high-grade stenosis of the M1 segment of either middle cerebral artery. Poor delineation of majority of middle cerebral artery branch vessels bilaterally more notable on the right.  Ectatic anterior cerebral arteries. A tiny anterior communicating artery aneurysm cannot be excluded as questioned on source images. This could be reassessed on follow-up when patient is better able to cooperate.  Left vertebral artery slightly dominant in size. No significant stenosis of the distal vertebral arteries.  Mild to moderate narrowing mid aspect of the basilar artery. Nonvisualized anterior inferior cerebral arteries.  Poor delineation the left superior cerebellar artery with mild moderate narrowing right superior cerebral artery.  Mild narrowing and irregularity posterior cerebral artery distal branches greater on the left.  IMPRESSION: MRI HEAD  Exam is motion degraded.  Acute nonhemorrhagic infarct right lenticular nucleus with extension into the posterior limb of the right internal capsule and the posterior aspect of the right corona radiata. Smaller tiny acute infarct anterior right corona radiata, posterior right peri operculum region and right temporal lobe. Tiny acute infarct peripheral aspect of the junction of the right frontal- parietal lobe.  No intracranial hemorrhage.  Mild to moderate small vessel disease type changes.  Plaque-like calcification/ossification left aspect of the falx. 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 Otherwise no evidence of intracranial mass detected on this unenhanced exam.  Global atrophy without hydrocephalus.  C3-4 bulge/protrusion with cord flattening.  MRA HEAD  Exam is motion degraded.  Right internal carotid artery is  occluded. Collateral flow to the right carotid terminus.  No high-grade stenosis of the M1 segment of either middle cerebral artery. Poor delineation of majority of middle cerebral artery branch vessels bilaterally more notable on the right.  Ectatic anterior cerebral arteries. A tiny anterior communicating artery aneurysm cannot be excluded as questioned on source images. This could be reassessed on follow-up when patient is better able to cooperate.  Left vertebral artery slightly dominant in size. No significant stenosis of the distal vertebral arteries.  Mild to moderate narrowing mid aspect of the basilar artery. Nonvisualized anterior inferior cerebral arteries.   Electronically Signed   By: Lacy Duverney M.D.   On: 09/07/2014 06:51   Mr Maxine Glenn Head/brain Wo Cm  09/07/2014   CLINICAL DATA:  64 year old male with atrial fibrillation with 2 day history of speech difficulty and left-sided weakness. Subsequent encounter.  EXAM: MRI HEAD WITHOUT CONTRAST  MRA HEAD WITHOUT CONTRAST  TECHNIQUE: Multiplanar, multiecho pulse sequences of the brain and surrounding structures were obtained without intravenous contrast. Angiographic images of the head were obtained using MRA technique without contrast.  COMPARISON:  09/06/2014 head CT.  No comparison brain MR.  FINDINGS: MRI HEAD FINDINGS  Exam is motion degraded.  Acute nonhemorrhagic infarct right lenticular nucleus with extension into the posterior limb of the right internal capsule and the posterior aspect of the right corona radiata. Smaller tiny acute infarct anterior right corona radiata, posterior right peri operculum region and right temporal lobe (lateral to the anterior aspect of the right temporal horn). Tiny acute infarct peripheral aspect of the junction of the right frontal- parietal lobe. No intracranial hemorrhage.  Mild to moderate small vessel disease type changes.  Abnormal appearance of the right internal carotid artery as noted below.  Plaque-like  calcification/ossification left aspect of the falx. 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 Otherwise no evidence of intracranial mass detected on this unenhanced exam.  Global atrophy without hydrocephalus.  C3-4 bulge/protrusion with cord flattening.  Pituitary region, pineal region and orbital structures unremarkable.  Minimal mucosal thickening inferior left maxillary sinus.  MRA HEAD FINDINGS  Exam is motion degraded.  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 Collateral flow to the right carotid terminus.  No high-grade stenosis of the M1 segment of either middle cerebral artery. Poor delineation of majority of middle cerebral artery branch vessels bilaterally more notable on the right.  Ectatic anterior cerebral arteries. A tiny anterior communicating artery aneurysm cannot be excluded as questioned on source images. This could be reassessed on follow-up when patient is better able to cooperate.  Left vertebral artery slightly dominant in size. No significant stenosis of the distal vertebral arteries.  Mild to moderate narrowing mid aspect of the basilar artery. Nonvisualized anterior inferior cerebral arteries.  Poor delineation the left superior cerebellar artery with mild moderate narrowing right superior cerebral artery.  Mild narrowing and irregularity posterior cerebral artery distal branches greater on the left.  IMPRESSION: MRI HEAD  Exam is motion degraded.  Acute nonhemorrhagic infarct right lenticular nucleus with extension into the posterior limb of the right internal capsule and the posterior aspect of the right corona radiata. Smaller tiny acute infarct anterior right corona radiata, posterior right peri operculum region and right temporal lobe. Tiny acute infarct peripheral aspect of the junction of the right frontal- parietal lobe.  No intracranial hemorrhage.  Mild to moderate small vessel disease type changes.  Plaque-like calcification/ossification  left aspect of the falx. 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 Otherwise no evidence of intracranial mass detected on this unenhanced exam.  Global atrophy without hydrocephalus.  C3-4 bulge/protrusion with cord flattening.  MRA HEAD  Exam is motion degraded.  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 Collateral flow to the right carotid terminus.  No high-grade stenosis of the M1 segment of either middle cerebral artery. Poor delineation of majority of middle cerebral artery branch vessels bilaterally more notable on the right.  Ectatic anterior cerebral arteries. A tiny anterior communicating artery aneurysm cannot be excluded as questioned on source images. This could be reassessed on follow-up when patient is better able to cooperate.  Left vertebral artery slightly dominant in size. No significant stenosis of the distal vertebral arteries.  Mild to moderate narrowing mid aspect of the basilar artery. Nonvisualized anterior inferior cerebral arteries.   Electronically Signed   By: Lacy Duverney M.D.   On: 09/07/2014 06:51    Scheduled Meds: .  stroke: mapping our early stages of recovery book   Does not apply Once  . amLODipine  5 mg Oral Daily  . apixaban  5 mg Oral Q12H  . aspirin  325 mg Oral Daily  . metoprolol succinate  12.5 mg Oral Daily  . simvastatin  20 mg Oral q1800  . sodium chloride  500 mL Intravenous Once   Continuous Infusions: . sodium chloride 75 mL/hr (09/07/14 2038)   Antibiotics Given (last 72 hours)    None      Active Problems:   TIA (transient ischemic attack)   Atrial fibrillation   CVA (cerebral vascular accident)   Stroke   Cerebral infarction due to embolism of right middle cerebral artery   Essential hypertension   HLD (hyperlipidemia)   Tobacco use disorder    Time spent: 25 min    Clarann Helvey  [facility] Pager [phone]. If 7PM-7AM, please contact night-coverage at [email or web address], password  [email or web address] 09/08/2014, 12:49 PM  LOS: 1 day

## 2014-09-09 DIAGNOSIS — I480 Paroxysmal atrial fibrillation: Secondary | ICD-10-CM

## 2014-09-09 DIAGNOSIS — I639 Cerebral infarction, unspecified: Secondary | ICD-10-CM

## 2014-09-09 DIAGNOSIS — I10 Essential (primary) hypertension: Secondary | ICD-10-CM

## 2014-09-09 LAB — CBC
HEMATOCRIT: 46.3 % (ref 39.0–52.0)
HEMOGLOBIN: 15.6 g/dL (ref 13.0–17.0)
MCH: 29.3 pg (ref 26.0–34.0)
MCHC: 33.7 g/dL (ref 30.0–36.0)
MCV: 86.9 fL (ref 78.0–100.0)
Platelets: 153 10*3/uL (ref 150–400)
RBC: 5.33 MIL/uL (ref 4.22–5.81)
RDW: 14.2 % (ref 11.5–15.5)
WBC: 4.8 10*3/uL (ref 4.0–10.5)

## 2014-09-09 LAB — BASIC METABOLIC PANEL
ANION GAP: 9 (ref 5–15)
BUN: 11 mg/dL (ref 6–20)
CALCIUM: 8.9 mg/dL (ref 8.9–10.3)
CO2: 25 mmol/L (ref 22–32)
Chloride: 106 mmol/L (ref 101–111)
Creatinine, Ser: 1.17 mg/dL (ref 0.61–1.24)
GFR calc Af Amer: >60 mL/min (ref >60)
GFR calc non Af Amer: >60 mL/min (ref >60)
GLUCOSE: 106 mg/dL — AB (ref 65–99)
Potassium: 3.9 mmol/L (ref 3.5–5.1)
Sodium: 140 mmol/L (ref 135–145)

## 2014-09-09 LAB — GLUCOSE, CAPILLARY
GLUCOSE-CAPILLARY: 120 mg/dL — AB (ref 65–99)
Glucose-Capillary: 94 mg/dL (ref 65–99)
Glucose-Capillary: 118 mg/dL — ABNORMAL HIGH (ref 65–99)
Glucose-Capillary: 140 mg/dL — ABNORMAL HIGH (ref 65–99)

## 2014-09-09 MED ORDER — IRBESARTAN 150 MG PO TABS
150.0000 mg | ORAL_TABLET | Freq: Every day | ORAL | Status: DC
  Administered 2014-09-10: 150 mg via ORAL
  Filled 2014-09-09: qty 1

## 2014-09-09 MED ORDER — IRBESARTAN 150 MG PO TABS: 75.0000 mg | ORAL_TABLET | Freq: Every day | ORAL | Status: DC

## 2014-09-09 NOTE — Progress Notes (Signed)
ANTICOAGULATION CONSULT NOTE   Pharmacy Consult for Eliquis Indication: atrial fibrillation  No Known Allergies  Patient Measurements: Height: 5' 7.5" (171.5 cm) Weight: 152 lb 3.2 oz (69.037 kg) IBW/kg (Calculated) : 67.25  Vital Signs: Temp: 97.8 F (36.6 C) (07/22 0642) Temp Source: Oral (07/22 0642) BP: 148/108 mmHg (07/22 0642) Pulse Rate: 85 (07/22 0642)  Labs:  Recent Labs  09/06/14 2254  09/06/14 2302 09/07/14 0011 09/07/14 0525 09/07/14 0808 09/07/14 1048 09/07/14 1714 09/07/14 2212 09/09/14 0323  HGB 16.8  --  19.7*  --   --  16.3  --   --   --  15.6  HCT 50.3  --  58.0*  --   --  47.7  --   --   --  46.3  PLT 188  --   --   --   --  159  --   --   --  153  APTT  --   --   --  30  --   --   --   --   --   --   LABPROT  --   --   --  15.0  --   --   --   --   --   --   INR  --   --   --  1.16  --   --   --   --   --   --   CREATININE  --   < > 1.60* 1.49* 1.29*  --   --   --   --  1.17  TROPONINI  --   --   --   --   --   --  0.35* 0.27* 0.21*  --   < > = values in this interval not displayed.  Estimated Creatinine Clearance: 63.1 mL/min (by C-G formula based on Cr of 1.17).   Medical History: Past Medical History  Diagnosis Date  . Peptic ulcer   . Irregular heart beat   . Hypertension     Medications:  Prescriptions prior to admission  Medication Sig Dispense Refill Last Dose  . aspirin 81 MG chewable tablet Chew 4 tablets (324 mg total) by mouth daily. 120 tablet 0 09/06/2014 at Unknown time  . Aspirin-Salicylamide-Caffeine (BC HEADACHE POWDER PO) Take 1 Package by mouth daily as needed (pain).   PRN  . metoprolol succinate (TOPROL-XL) 25 MG 24 hr tablet Take 1 tablet (25 mg total) by mouth daily. 30 tablet 6 09/06/2014 at 0730  . Multiple Vitamins-Minerals (MULTIVITAMIN & MINERAL PO) Take 1 tablet by mouth daily.   09/05/2014 at Unknown time    Assessment: AMS  64 year old male transferred from [facility] to [facility] who was recently diagnosed atrial  fibrillation presents with AMS. CT negative. MRI positive. CHADSVASC=3. Scr 1.17, CrCl 63 ml/min. CBC WNL.  Pt education completed 7/21.  Goal of Therapy:  Therapeutic oral anticoagulation Monitor platelets by anticoagulation protocol: Yes   Plan:  Eliquis 5mg  BID Pharmacy will sign off but are available if needed to continue monitoring.  Greggory Stallion, PharmD Clinical Pharmacist Pager # [phone] 09/09/2014 8:41 AM

## 2014-09-09 NOTE — Hospital Discharge Follow-Up (Signed)
This Case Manager received call from Elmer Bales, RN CM about patient. She indicated patient uninsured and without a PCP.  Patient scheduled an appointment on 09/13/14 at 1000 with Dr. Armen Pickup at [facility].  Appointment time placed on AVS. Elmer Bales, RN CM updated.

## 2014-09-09 NOTE — Progress Notes (Addendum)
DAILY PROGRESS NOTE  Subjective:  No events overnight. Creatinine has improved, troponin is trending down. BP remains elevated, plan for permissive hypertension. On Eliquis for stroke - CHADSVASC of 5.  Objective:  Temp:  [97.7 F (36.5 C)-98.2 F (36.8 C)] 97.9 F (36.6 C) (07/22 1025) Pulse Rate:  [44-85] 84 (07/22 1025) Resp:  [19-20] 20 (07/22 1025) BP: (135-165)/(108-132) 151/132 mmHg (07/22 1025) SpO2:  [99 %-100 %] 99 % (07/22 1025) Weight change:   Intake/Output from previous day: 07/21 0701 - 07/22 0700 In: 360 [P.O.:360] Out: 1110 [Urine:1110]  Intake/Output from this shift:    Medications: Current Facility-Administered Medications  Medication Dose Route Frequency Provider Last Rate Last Dose  .  stroke: mapping our early stages of recovery book   Does not apply Once Quintella Baton, MD      . amLODipine (NORVASC) tablet 5 mg  5 mg Oral Daily Geradine Girt, DO   5 mg at 09/09/14 1043  . apixaban (ELIQUIS) tablet 5 mg  5 mg Oral Q12H Karren Cobble, RPH   5 mg at 09/09/14 6812  . metoprolol succinate (TOPROL-XL) 24 hr tablet 50 mg  50 mg Oral Daily Minus Breeding, MD   50 mg at 09/09/14 1042  . simvastatin (ZOCOR) tablet 20 mg  20 mg Oral q1800 Geradine Girt, DO   20 mg at 09/08/14 1759    Physical Exam: General appearance: alert and no distress Lungs: clear to auscultation bilaterally Heart: regular rate and rhythm, S1, S2 normal, no murmur, click, rub or gallop Extremities: extremities normal, atraumatic, no cyanosis or edema  Lab Results: Results for orders placed or performed during the hospital encounter of 09/06/14 (from the past 48 hour(s))  Glucose, capillary     Status: None   Collection Time: 09/07/14 11:03 AM  Result Value Ref Range   Glucose-Capillary 99 65 - 99 mg/dL  Glucose, capillary     Status: None   Collection Time: 09/07/14 11:03 AM  Result Value Ref Range   Glucose-Capillary 99 65 - 99 mg/dL  Urine rapid drug screen (hosp  performed)not at [facility]     Status: None   Collection Time: 09/07/14  1:52 PM  Result Value Ref Range   Opiates NONE DETECTED NONE DETECTED   Cocaine NONE DETECTED NONE DETECTED   Benzodiazepines NONE DETECTED NONE DETECTED   Amphetamines NONE DETECTED NONE DETECTED   Tetrahydrocannabinol NONE DETECTED NONE DETECTED   Barbiturates NONE DETECTED NONE DETECTED    Comment:        DRUG SCREEN FOR MEDICAL PURPOSES ONLY.  IF CONFIRMATION IS NEEDED FOR ANY PURPOSE, NOTIFY LAB WITHIN 5 DAYS.        LOWEST DETECTABLE LIMITS FOR URINE DRUG SCREEN Drug Class       Cutoff (ng/mL) Amphetamine      1000 Barbiturate      200 Benzodiazepine   751 Tricyclics       700 Opiates          300 Cocaine          300 THC              50   Glucose, capillary     Status: Abnormal   Collection Time: 09/07/14  4:10 PM  Result Value Ref Range   Glucose-Capillary 101 (H) 65 - 99 mg/dL  Troponin I (q 6hr x 3)     Status: Abnormal   Collection Time: 09/07/14  5:14 PM  Result Value Ref Range  Troponin I 0.27 (H) <0.031 ng/mL    Comment:        PERSISTENTLY INCREASED TROPONIN VALUES IN THE RANGE OF 0.04-0.49 ng/mL CAN BE SEEN IN:       -UNSTABLE ANGINA       -CONGESTIVE HEART FAILURE       -MYOCARDITIS       -CHEST TRAUMA       -ARRYHTHMIAS       -LATE PRESENTING MYOCARDIAL INFARCTION       -COPD   CLINICAL FOLLOW-UP RECOMMENDED.   Glucose, capillary     Status: Abnormal   Collection Time: 09/07/14  9:34 PM  Result Value Ref Range   Glucose-Capillary 103 (H) 65 - 99 mg/dL   Comment 1 Notify RN    Comment 2 Document in Chart   Troponin I (q 6hr x 3)     Status: Abnormal   Collection Time: 09/07/14 10:12 PM  Result Value Ref Range   Troponin I 0.21 (H) <0.031 ng/mL    Comment:        PERSISTENTLY INCREASED TROPONIN VALUES IN THE RANGE OF 0.04-0.49 ng/mL CAN BE SEEN IN:       -UNSTABLE ANGINA       -CONGESTIVE HEART FAILURE       -MYOCARDITIS       -CHEST TRAUMA       -ARRYHTHMIAS        -LATE PRESENTING MYOCARDIAL INFARCTION       -COPD   CLINICAL FOLLOW-UP RECOMMENDED.   Glucose, capillary     Status: None   Collection Time: 09/08/14  6:25 AM  Result Value Ref Range   Glucose-Capillary 86 65 - 99 mg/dL   Comment 1 Notify RN    Comment 2 Document in Chart   Glucose, capillary     Status: None   Collection Time: 09/08/14 11:29 AM  Result Value Ref Range   Glucose-Capillary 90 65 - 99 mg/dL  Glucose, capillary     Status: Abnormal   Collection Time: 09/08/14  4:32 PM  Result Value Ref Range   Glucose-Capillary 127 (H) 65 - 99 mg/dL  Glucose, capillary     Status: Abnormal   Collection Time: 09/08/14 10:01 PM  Result Value Ref Range   Glucose-Capillary 105 (H) 65 - 99 mg/dL   Comment 1 Notify RN    Comment 2 Document in Chart   CBC     Status: None   Collection Time: 09/09/14  3:23 AM  Result Value Ref Range   WBC 4.8 4.0 - 10.5 K/uL   RBC 5.33 4.22 - 5.81 MIL/uL   Hemoglobin 15.6 13.0 - 17.0 g/dL   HCT 46.3 39.0 - 52.0 %   MCV 86.9 78.0 - 100.0 fL   MCH 29.3 26.0 - 34.0 pg   MCHC 33.7 30.0 - 36.0 g/dL   RDW 14.2 11.5 - 15.5 %   Platelets 153 150 - 400 K/uL  Basic metabolic panel     Status: Abnormal   Collection Time: 09/09/14  3:23 AM  Result Value Ref Range   Sodium 140 135 - 145 mmol/L   Potassium 3.9 3.5 - 5.1 mmol/L   Chloride 106 101 - 111 mmol/L   CO2 25 22 - 32 mmol/L   Glucose, Bld 106 (H) 65 - 99 mg/dL   BUN 11 6 - 20 mg/dL   Creatinine, Ser 1.17 0.61 - 1.24 mg/dL   Calcium 8.9 8.9 - 10.3 mg/dL   GFR calc non Af Amer >  60 >60 mL/min   GFR calc Af Amer >60 >60 mL/min    Comment: (NOTE) The eGFR has been calculated using the CKD EPI equation. This calculation has not been validated in all clinical situations. eGFR's persistently <60 mL/min signify possible Chronic Kidney Disease.    Anion gap 9 5 - 15  Glucose, capillary     Status: None   Collection Time: 09/09/14  6:47 AM  Result Value Ref Range   Glucose-Capillary 94 65 - 99 mg/dL     Comment 1 Notify RN    Comment 2 Document in Chart     Imaging: Ct Angio Head W/cm &/or Wo Cm  09/07/2014   CLINICAL DATA:  Stroke and ICA occlusion.  Atrial fibrillation  EXAM: CT ANGIOGRAPHY HEAD AND NECK  TECHNIQUE: Multidetector CT imaging of the head and neck was performed using the standard protocol during bolus administration of intravenous contrast. Multiplanar CT image reconstructions and MIPs were obtained to evaluate the vascular anatomy. Carotid stenosis measurements (when applicable) are obtained utilizing NASCET criteria, using the distal internal carotid diameter as the denominator.  CONTRAST:  3m OMNIPAQUE IOHEXOL 350 MG/ML SOLN  COMPARISON:  Brain MRI from earlier the same day  FINDINGS: CTA NECK  Aortic arch: No aneurysm or dissection.  Standard branching.  Right carotid system: Patent brachiocephalic artery. Just beyond the ICA bifurcation there is graded nonvisualization of the lumen contrast. Mild calcified atherosclerotic plaque is present at the ICA bifurcation. No discrete dissection flap or ulcerated plaque is seen. There is intracranial reconstitution. Note that the upper right cervical ICA is visible on delayed phase, but there is no opacification at the level of the carotid terminus, suggesting there is focal thrombosis at this level with delayed enhancement proximally. Further discussed below.  Left carotid system: No stenosis or discrete atheromatous change. Tortuous ICA without changes of fibromuscular dysplasia.  Vertebral arteries:Mild left dominance. The vessels are widely patent into the head. No evidence of dissection or other acute disease.  Skeleton: No contributory findings.  Other neck: No significant incidental finding.  CTA HEAD  Anterior circulation: Intracranial right ICA occlusion with opacification around a finger like projection suggesting recent clot/embolus. There is reconstituted flow in the right M1 segment via a broad anterior communicating artery (no  aneurysm seen in this location as questioned on previous MRA). No downstream major branch occlusion is seen. No significant stenosis seen in the anterior cerebral or left middle cerebral branches.  Posterior circulation: No stenosis, aneurysm, or major branch occlusion.  Venous sinuses: Patent as permitted by timing.  Delayed phase: Enhancement of the upper cervical right ICA, as discussed above.  Low-density in the posterior limb right internal capsule and neighboring putamen correlating with infarct seen on preceding brain MRI. Other infarcts are not well visualized. There is generalized cerebral volume loss which is greater than expected for age, with chronic small vessel ischemia best visualized around the lateral ventricles.  These results were called by telephone at the time of interpretation on 09/07/2014 at 2:05 pm to Dr. JRosalin Hawking, who verbally acknowledged these results.  IMPRESSION: 1. Occlusive embolus to the supraclinoid right ICA with good downstream flow via this anterior communicating artery. The right cervical ICA is likely patent, with delayed filling seen at the skullbase. 2. Mild atheromatous change, mainly at the right carotid bifurcation.   Electronically Signed   By: JMonte FantasiaM.D.   On: 09/07/2014 14:09   Ct Angio Neck W/cm &/or Wo/cm  09/07/2014   CLINICAL  DATA:  Stroke and ICA occlusion.  Atrial fibrillation  EXAM: CT ANGIOGRAPHY HEAD AND NECK  TECHNIQUE: Multidetector CT imaging of the head and neck was performed using the standard protocol during bolus administration of intravenous contrast. Multiplanar CT image reconstructions and MIPs were obtained to evaluate the vascular anatomy. Carotid stenosis measurements (when applicable) are obtained utilizing NASCET criteria, using the distal internal carotid diameter as the denominator.  CONTRAST:  21m OMNIPAQUE IOHEXOL 350 MG/ML SOLN  COMPARISON:  Brain MRI from earlier the same day  FINDINGS: CTA NECK  Aortic arch: No aneurysm or  dissection.  Standard branching.  Right carotid system: Patent brachiocephalic artery. Just beyond the ICA bifurcation there is graded nonvisualization of the lumen contrast. Mild calcified atherosclerotic plaque is present at the ICA bifurcation. No discrete dissection flap or ulcerated plaque is seen. There is intracranial reconstitution. Note that the upper right cervical ICA is visible on delayed phase, but there is no opacification at the level of the carotid terminus, suggesting there is focal thrombosis at this level with delayed enhancement proximally. Further discussed below.  Left carotid system: No stenosis or discrete atheromatous change. Tortuous ICA without changes of fibromuscular dysplasia.  Vertebral arteries:Mild left dominance. The vessels are widely patent into the head. No evidence of dissection or other acute disease.  Skeleton: No contributory findings.  Other neck: No significant incidental finding.  CTA HEAD  Anterior circulation: Intracranial right ICA occlusion with opacification around a finger like projection suggesting recent clot/embolus. There is reconstituted flow in the right M1 segment via a broad anterior communicating artery (no aneurysm seen in this location as questioned on previous MRA). No downstream major branch occlusion is seen. No significant stenosis seen in the anterior cerebral or left middle cerebral branches.  Posterior circulation: No stenosis, aneurysm, or major branch occlusion.  Venous sinuses: Patent as permitted by timing.  Delayed phase: Enhancement of the upper cervical right ICA, as discussed above.  Low-density in the posterior limb right internal capsule and neighboring putamen correlating with infarct seen on preceding brain MRI. Other infarcts are not well visualized. There is generalized cerebral volume loss which is greater than expected for age, with chronic small vessel ischemia best visualized around the lateral ventricles.  These results were  called by telephone at the time of interpretation on 09/07/2014 at 2:05 pm to Dr. JRosalin Hawking, who verbally acknowledged these results.  IMPRESSION: 1. Occlusive embolus to the supraclinoid right ICA with good downstream flow via this anterior communicating artery. The right cervical ICA is likely patent, with delayed filling seen at the skullbase. 2. Mild atheromatous change, mainly at the right carotid bifurcation.   Electronically Signed   By: JMonte FantasiaM.D.   On: 09/07/2014 14:09    Assessment:  1. Active Problems: 2.   TIA (transient ischemic attack) 3.   Atrial fibrillation 4.   CVA (cerebral vascular accident) 5.   Stroke 6.   Cerebral infarction due to embolism of right middle cerebral artery 7.   Essential hypertension 8.   HLD (hyperlipidemia) 9.   Tobacco use disorder 10.   Cardiomyopathy, ischemic 11.   Plan:  1. Continue Toprol XL and Eliquis. Change amlodipine to irbesartan 150 mg daily for heart failure. Uptitrate as blood pressure permission allows. Outpatient stress testing is recommended. Appears euvolemic at this time.  Time Spent Directly with Patient:  15 minutes  Length of Stay:  LOS: 2 days   KPixie Casino MD, FCoastal Surgical Specialists IncAttending Cardiologist CMedina  Nadean Corwin Hilty 09/09/2014, 10:51 AM

## 2014-09-09 NOTE — Clinical Social Work Note (Addendum)
Clinical Social Work Assessment  Patient Details  Name: Jeffrey Martin MRN: [id number] Date of Birth: October 21, 1950  Date of referral:  09/09/14               Reason for consult:  Facility Placement, Discharge Planning                Permission sought to share information with:  Case Manager, Facility Sport and exercise psychologist, PCP, Family Supports Permission granted to share information::  Yes, Verbal Permission Granted  Name::      [name])  Agency::   (n/a )  Relationship::   (Spouse )  Contact Information:   [phone])  Housing/Transportation Living arrangements for the past 2 months:  [place] of Information:  Patient Patient Interpreter Needed:  None Criminal Activity/Legal Involvement Pertinent to Current Situation/Hospitalization:  No - Comment as needed Significant Relationships:  Spouse Lives with:  Spouse Do you feel safe going back to the place where you live?  Yes Need for family participation in patient care:  Yes (Comment)  Care giving concerns:  Cognitive delays and speech impairment.    Social Worker assessment / plan:  Holiday representative met with patient at bedside. CSW introduced CSW role and SNF process. CSW also explained barriers to placement due to not having insurance. Patient reported he is undecided about SNF placement and would like to further discuss options with his wife, Rod Holler. Per RNCM, pt's wife is agreeable to patient returning home. RNCM planning to arrange follow-up appointments with [facility] and medication assistance. Patient and family all agreeable to patient returning home at discharge. No further concerns reported by family at this time. Case discussed with CSW Director for Letter of Guarantee (LOG) approval if patient was agreeable to SNF. LOG not approved at this time. Clinical Social Worker will sign off for now as social work intervention is no longer needed. Please consult [place] again if new need  arises.  Employment status:  Unemployed Forensic scientist:   (NONE) PT Recommendations:  [place] / Referral to community resources:   (NONE)  Patient/Family's Response to care:  Patient alert and oriented. Patient flat during assessment and reported he wishes to return home at discharge. Patient pleasant and appreciated social work intervention.   Patient/Family's Understanding of and Emotional Response to Diagnosis, Current Treatment, and Prognosis: Patient understands he is positive of stroke and that medical workup will continue. CSW signing off.   Emotional Assessment Appearance:  Appears stated age Attitude/Demeanor/Rapport:   (Pleasant ) Affect (typically observed):  Appropriate, Calm, Flat Orientation:  Oriented to Situation, Oriented to  Time, Oriented to Place, Oriented to Self Alcohol / Substance use:  Never Used Psych involvement (Current and /or in the community):  No (Comment)  Discharge Needs  Concerns to be addressed:  Home Safety Concerns No Insurance  Readmission within the last 30 days:  No Current discharge risk:  Cognitively Impaired Barriers to Discharge:  Barriers Resolved   Glendon Axe, MSW, LCSWA [phone] 09/09/2014 2:05 PM

## 2014-09-09 NOTE — Progress Notes (Signed)
PROGRESS NOTE  Jeffrey Martin QMV:[id number] DOB: March 06, 1950 DOA: 09/06/2014 PCP: No PCP Per Patient  Assessment/Plan: CVA -MRI + [place] VASc2-- at least 4--added eliquis Patient is uninsured and wants to go home. Have discussed with care management for home services. SLP eval CTA head and neck showed right distal ICA occlusion  Hypertension: Uncontrolled.  Adjust medications.   Atrial fibrillation Rate control. On Eliquis  Chronic kidney disease stage II -Stable, at baseline  Tobacco abuse -encourage cessation  Elevated troponin Outpatient stress test  Cardiomyopathy: He appears euvolemic. Cardiology adjusting medications. Adding herb is certain.  Code Status: full Family Communication:  Disposition Plan: Possibly home with home health tomorrow if blood pressure improved.   Consultants:  Neuro  cards  Procedures:  HPI/Subjective: No complaints.  Objective: Filed Vitals:   09/09/14 1025  BP: 151/132  Pulse: 84  Temp: 97.9 F (36.6 C)  Resp: 20    Intake/Output Summary (Last 24 hours) at 09/09/14 1105 Last data filed at 09/09/14 0644  Gross per 24 hour  Intake    360 ml  Output   1110 ml  Net   -750 ml   Filed Weights   09/07/14 0434  Weight: 69.037 kg (152 lb 3.2 oz)    Exam:   General:  Up in chair. Alert. Answers questions appropriately but speech halting.  Cardiovascular: irregularly irregular  Respiratory: clear  Abdomen: +BS, soft  Musculoskeletal: no edema   Neurologic: Speech slow and halting. Left hand week.  Data Reviewed: Basic Metabolic Panel:  Recent Labs Lab 09/06/14 2302 09/07/14 0011 09/07/14 0525 09/09/14 0323  NA 139 141 139 140  K 6.7* 4.3 4.2 3.9  CL 108 108 107 106  CO2  --  GLUCOSE 99 93 93 106*  BUN 30* CREATININE 1.60* 1.49* 1.29* 1.17  CALCIUM  --  9.5 9.1 8.9   Liver Function Tests:  Recent Labs Lab 09/07/14 0011  AST 23  ALT 17  ALKPHOS 94  BILITOT 1.0  PROT 7.7   ALBUMIN 3.9   No results for input(s): LIPASE, AMYLASE in the last 168 hours. No results for input(s): AMMONIA in the last 168 hours. CBC:  Recent Labs Lab 09/06/14 2254 09/06/14 2302 09/07/14 0808 09/09/14 0323  WBC 6.8  --  6.0 4.8  NEUTROABS 3.3  --   --   --   HGB 16.8 19.7* 16.3 15.6  HCT 50.3 58.0* 47.7 46.3  MCV 88.9  --  87.0 86.9  PLT 188  --  159 153   Cardiac Enzymes:  Recent Labs Lab 09/07/14 1048 09/07/14 1714 09/07/14 2212  TROPONINI 0.35* 0.27* 0.21*   BNP (last 3 results) No results for input(s): BNP in the last 8760 hours.  ProBNP (last 3 results) No results for input(s): PROBNP in the last 8760 hours.  CBG:  Recent Labs Lab 09/08/14 0625 09/08/14 1129 09/08/14 1632 09/08/14 2201 09/09/14 0647  GLUCAP 86 90 127* 105* 94    No results found for this or any previous visit (from the past 240 hour(s)).   Studies: Ct Angio Head W/cm &/or Wo Cm  09/07/2014   CLINICAL DATA:  Stroke and ICA occlusion.  Atrial fibrillation  EXAM: CT ANGIOGRAPHY HEAD AND NECK  TECHNIQUE: Multidetector CT imaging of the head and neck was performed using the standard protocol during bolus administration of intravenous contrast. Multiplanar CT image reconstructions and MIPs were obtained to evaluate the vascular anatomy. Carotid stenosis measurements (when applicable) are  obtained utilizing NASCET criteria, using the distal internal carotid diameter as the denominator.  CONTRAST:  80mL OMNIPAQUE IOHEXOL 350 MG/ML SOLN  COMPARISON:  Brain MRI from earlier the same day  FINDINGS: CTA NECK  Aortic arch: No aneurysm or dissection.  Standard branching.  Right carotid system: Patent brachiocephalic artery. Just beyond the ICA bifurcation there is graded nonvisualization of the lumen contrast. Mild calcified atherosclerotic plaque is present at the ICA bifurcation. No discrete dissection flap or ulcerated plaque is seen. There is intracranial reconstitution. Note that the upper right  cervical ICA is visible on delayed phase, but there is no opacification at the level of the carotid terminus, suggesting there is focal thrombosis at this level with delayed enhancement proximally. Further discussed below.  Left carotid system: No stenosis or discrete atheromatous change. Tortuous ICA without changes of fibromuscular dysplasia.  Vertebral arteries:Mild left dominance. The vessels are widely patent into the head. No evidence of dissection or other acute disease.  Skeleton: No contributory findings.  Other neck: No significant incidental finding.  CTA HEAD  Anterior circulation: Intracranial right ICA occlusion with opacification around a finger like projection suggesting recent clot/embolus. There is reconstituted flow in the right M1 segment via a broad anterior communicating artery (no aneurysm seen in this location as questioned on previous MRA). No downstream major branch occlusion is seen. No significant stenosis seen in the anterior cerebral or left middle cerebral branches.  Posterior circulation: No stenosis, aneurysm, or major branch occlusion.  Venous sinuses: Patent as permitted by timing.  Delayed phase: Enhancement of the upper cervical right ICA, as discussed above.  Low-density in the posterior limb right internal capsule and neighboring putamen correlating with infarct seen on preceding brain MRI. Other infarcts are not well visualized. There is generalized cerebral volume loss which is greater than expected for age, with chronic small vessel ischemia best visualized around the lateral ventricles.  These results were called by telephone at the time of interpretation on 09/07/2014 at 2:05 pm to Dr. Marvel Plan , who verbally acknowledged these results.  IMPRESSION: 1. Occlusive embolus to the supraclinoid right ICA with good downstream flow via this anterior communicating artery. The right cervical ICA is likely patent, with delayed filling seen at the skullbase. 2. Mild atheromatous  change, mainly at the right carotid bifurcation.   Electronically Signed   By: Marnee Spring M.D.   On: 09/07/2014 14:09   Ct Angio Neck W/cm &/or Wo/cm  09/07/2014   CLINICAL DATA:  Stroke and ICA occlusion.  Atrial fibrillation  EXAM: CT ANGIOGRAPHY HEAD AND NECK  TECHNIQUE: Multidetector CT imaging of the head and neck was performed using the standard protocol during bolus administration of intravenous contrast. Multiplanar CT image reconstructions and MIPs were obtained to evaluate the vascular anatomy. Carotid stenosis measurements (when applicable) are obtained utilizing NASCET criteria, using the distal internal carotid diameter as the denominator.  CONTRAST:  80mL OMNIPAQUE IOHEXOL 350 MG/ML SOLN  COMPARISON:  Brain MRI from earlier the same day  FINDINGS: CTA NECK  Aortic arch: No aneurysm or dissection.  Standard branching.  Right carotid system: Patent brachiocephalic artery. Just beyond the ICA bifurcation there is graded nonvisualization of the lumen contrast. Mild calcified atherosclerotic plaque is present at the ICA bifurcation. No discrete dissection flap or ulcerated plaque is seen. There is intracranial reconstitution. Note that the upper right cervical ICA is visible on delayed phase, but there is no opacification at the level of the carotid terminus, suggesting there is focal  thrombosis at this level with delayed enhancement proximally. Further discussed below.  Left carotid system: No stenosis or discrete atheromatous change. Tortuous ICA without changes of fibromuscular dysplasia.  Vertebral arteries:Mild left dominance. The vessels are widely patent into the head. No evidence of dissection or other acute disease.  Skeleton: No contributory findings.  Other neck: No significant incidental finding.  CTA HEAD  Anterior circulation: Intracranial right ICA occlusion with opacification around a finger like projection suggesting recent clot/embolus. There is reconstituted flow in the right M1  segment via a broad anterior communicating artery (no aneurysm seen in this location as questioned on previous MRA). No downstream major branch occlusion is seen. No significant stenosis seen in the anterior cerebral or left middle cerebral branches.  Posterior circulation: No stenosis, aneurysm, or major branch occlusion.  Venous sinuses: Patent as permitted by timing.  Delayed phase: Enhancement of the upper cervical right ICA, as discussed above.  Low-density in the posterior limb right internal capsule and neighboring putamen correlating with infarct seen on preceding brain MRI. Other infarcts are not well visualized. There is generalized cerebral volume loss which is greater than expected for age, with chronic small vessel ischemia best visualized around the lateral ventricles.  These results were called by telephone at the time of interpretation on 09/07/2014 at 2:05 pm to Dr. Marvel Plan , who verbally acknowledged these results.  IMPRESSION: 1. Occlusive embolus to the supraclinoid right ICA with good downstream flow via this anterior communicating artery. The right cervical ICA is likely patent, with delayed filling seen at the skullbase. 2. Mild atheromatous change, mainly at the right carotid bifurcation.   Electronically Signed   By: Marnee Spring M.D.   On: 09/07/2014 14:09    Scheduled Meds: .  stroke: mapping our early stages of recovery book   Does not apply Once  . apixaban  5 mg Oral Q12H  . [START ON 09/10/2014] irbesartan  150 mg Oral Daily  . metoprolol succinate  50 mg Oral Daily  . simvastatin  20 mg Oral q1800   Continuous Infusions:   Antibiotics Given (last 72 hours)    None      Active Problems:   TIA (transient ischemic attack)   Atrial fibrillation   CVA (cerebral vascular accident)   Stroke   Cerebral infarction due to embolism of right middle cerebral artery   Essential hypertension   HLD (hyperlipidemia)   Tobacco use disorder   Cardiomyopathy,  ischemic    Time spent: 25 min    Season Astacio L  [facility] [email or web address], password [facility] 09/09/2014, 11:05 AM  LOS: 2 days

## 2014-09-09 NOTE — Care Management Note (Signed)
Case Management Note  Patient Details  Name: Bohdi Blinder MRN: [id number] Date of Birth: August 02, 1950  Subjective/Objective:                    Action/Plan: MATCH letter was completed and placed with the Eliquis copay card on the patient's shadow chart in anticipation of discharge home tomorrow.  CM spoke with patient's wife via phone to notify her of the [facility] letter and Eliquis card.  Bedside RN and charge RN are aware that they are located in the shadow chart and need to go with the patient at discharge.  Expected Discharge Date:                  Expected Discharge Plan:  Home w Home Health Services  In-House Referral:  Financial Counselor  Discharge planning Services  CM Consult, Indigent Health Clinic, [facility] Program, Medication Assistance  Post Acute Care Choice:  Durable Medical Equipment, Home Health Choice offered to:  Patient, Spouse  DME Arranged:  Walker rolling DME Agency:  [facility]  HH Arranged:  RN, PT, Speech Therapy (CSW) HH Agency:  [facility]  Status of Service:  Completed, signed off  Medicare Important Message Given:    Date Medicare IM Given:    Medicare IM give by:    Date Additional Medicare IM Given:    Additional Medicare Important Message give by:     If discussed at Long Length of Stay Meetings, dates discussed:    Additional Comments:  Anda Kraft, RN 09/09/2014, 2:19 PM

## 2014-09-09 NOTE — Progress Notes (Addendum)
Met with patient to discuss discharge planning. Per conversations with attending MD, CSW and patient, the plan is for patient to discharge home with home health services. Patient was admitted as self-pay and is being followed by Ana with Financial Counseling.  Patient has a follow-up appointment at the [facility] to establish care.  CM spoke with Tiffany with [facility] to notify of need for home health services.  Patient is agreeable to the plan.  He will be discharging home on new Eliquis, 30 day free trial card was provided. Patient's wife, Ruth, was updated via phone. [phone]. 

## 2014-09-09 NOTE — Progress Notes (Signed)
Physical Therapy Treatment Patient Details Name: Jeffrey Martin MRN: [id number] DOB: Feb 15, 1951 Today's Date: 09/09/2014    History of Present Illness 64 yo male admitted with weakness. Tranfered from [facility] to [facility]. CT (-) MRI (+) R internal carotid artery occluded, acute R lenticular nucleus with extension into the posterior limb of the right internal capsule and posterior aspect of the right corona radiata, posterior right peri operculum region and R temporal lobe. tiny acute R frontal parietal lobe infarct PMH: peptic ulcer, iregular heart beat, smokes, alcohol     PT Comments    Pt is Progressing towards goals. Physically the Pt is improving however when more challenging task were introduced (stepping over 3in tissue box and weaving between two chairs) pt balance was compromised. Pt cognition is still impaired and there is a mild left side neglect noticed during ambulation. Spoke with CSW, and pt likely to go home. Recommend RW and to maximize [facility] services for safety at d/c if family cannot provide 24/7 supervision.  Follow Up Recommendations  SNF;Supervision/Assistance - 24 hour (If not an option - maximize HH services)     Equipment Recommendations  Rolling walker with 5" wheels    Recommendations for Other Services       Precautions / Restrictions Precautions Precautions: Fall Restrictions Weight Bearing Restrictions: No    Mobility  Bed Mobility Overal bed mobility: Modified Independent             General bed mobility comments: pt able to sit up in bed without the use of railings however pt was impulsive and  was not aware for iv line being attatched to himself. VC given to reposition self to untangle iv line.  Transfers Overall transfer level: Needs assistance Equipment used: None Transfers: Sit to/from Stand Sit to Stand: Supervision         General transfer comment: supervision A was given for safety  Ambulation/Gait Ambulation/Gait assistance: Min  assist Ambulation Distance (Feet): 430 Feet Assistive device: None Gait Pattern/deviations: Step-through pattern;Drifts right/left;Trunk flexed;Shuffle Gait velocity: slow Gait velocity interpretation: Below normal speed for age/gender General Gait Details: challenged pt by having pt ambulate in a figure eight pattern through two chairs, also had pt step over tissue box. pt demo difficulty stepping over tissue box with LOB requiring min assist to stabilize. Drifting into objects on left requiring moderate VC to correct and for awareness of surroundings. Had pt identify objects on Lt while ambulating.   Stairs            Wheelchair Mobility    Modified Rankin (Stroke Patients Only) Modified Rankin (Stroke Patients Only) Pre-Morbid Rankin Score: No symptoms Modified Rankin: Moderately severe disability     Balance Overall balance assessment: Needs assistance Sitting-balance support: No upper extremity supported;Feet supported Sitting balance-Leahy Scale: Good     Standing balance support: No upper extremity supported;During functional activity Standing balance-Leahy Scale: Fair Standing balance comment: Pt able to stand with no AD however when challenged pt becomes unsteady.                    Cognition Arousal/Alertness: Awake/alert Behavior During Therapy: Flat affect Overall Cognitive Status: Impaired/Different from baseline Area of Impairment: Following commands;Safety/judgement;Problem solving;Attention;Awareness   Current Attention Level: Alternating   Following Commands: Follows multi-step commands consistently Safety/Judgement: Decreased awareness of safety;Decreased awareness of deficits Awareness: Emergent Problem Solving: Slow processing;Difficulty sequencing;Requires verbal cues;Decreased initiation General Comments: Pt demo mild Lside neglect    Exercises      General Comments General  comments (skin integrity, edema, etc.): Differential - R LE  is weak vs pt is not coordinated with L LE  to safely complete stepping over tissue box task.      Pertinent Vitals/Pain Pain Assessment: No/denies pain    Home Living       Type of Home: House              Prior Function            PT Goals (current goals can now be found in the care plan section) Acute Rehab PT Goals Patient Stated Goal: None stated PT Goal Formulation: Patient unable to participate in goal setting Time For Goal Achievement: 09/21/14 Potential to Achieve Goals: Good Progress towards PT goals: Progressing toward goals    Frequency  Min 4X/week    PT Plan Equipment recommendations need to be updated    Co-evaluation             End of Session Equipment Utilized During Treatment: Gait belt Activity Tolerance: Patient tolerated treatment well Patient left: in chair;with call bell/phone within reach;with nursing/sitter in room;with chair alarm set     Time: 1009-1030 PT Time Calculation (min) (ACUTE ONLY): 21 min  Charges:  $Gait Training: 8-22 mins                    G Codes:      Carren Rang Oct 02, 2014, 2:21 PM  Dorrene German (student physical therapy assistant) Acute Rehab [phone]

## 2014-09-09 NOTE — Evaluation (Signed)
Speech Language Pathology Evaluation Patient Details Name: Jeffrey Martin MRN: [id number] DOB: Feb 13, 1951 Today's Date: 09/09/2014 Time: 0370-4888 SLP Time Calculation (min) (ACUTE ONLY): 24 min  Problem List:  Patient Active Problem List   Diagnosis Date Noted  . Cardiomyopathy, ischemic   . TIA (transient ischemic attack) 09/07/2014  . Atrial fibrillation 09/07/2014  . CVA (cerebral vascular accident)   . Stroke   . Cerebral infarction due to embolism of right middle cerebral artery   . Essential hypertension   . HLD (hyperlipidemia)   . Tobacco use disorder    Past Medical History:  Past Medical History  Diagnosis Date  . Peptic ulcer   . Irregular heart beat   . Hypertension    Past Surgical History:  Past Surgical History  Procedure Laterality Date  . Repair of perforated ulcer     HPI:  64 yo male admitted with weakness. [facility]an[facility] from [facility] to [facility]. CT (-) MRI (+) R internal carotid artery occluded, acute R lenticular nucleus with extension into the posterior limb of the right internal capsule and posterior aspect of the right corona radiata, posterior right peri operculum region and R temporal lobe. tiny acute R frontal parietal lobe infarct PMH: peptic ulcer, iregular heart beat, smokes, alcohol    Assessment / Plan / Recommendation Clinical Impression  Pt presents with a mild aphasia and dysarthria of speech (he is left handed and right-brain dominant for language).  Expression is marked by adequate naming to confrontation, but impairments noted at level of propositional speech and when asked to respond to novel questions.  Spontaneity of output is diminished.  Pt also noted to have difficulty following multistep commands and answering complex yes/no questions.  Pt is likely to D/C home with [facility] services - recommend SLP f/u; initial 24 hour supervision is recommended.        SLP Assessment  All further Speech Language Pathology  needs can be addressed in the next venue  of care - pt for D/C next date.   Follow Up Recommendations  Home health SLP    Frequency and Duration        Pertinent Vitals/Pain Pain Assessment: No/denies pain   SLP Goals     SLP Evaluation Prior Functioning  Cognitive/Linguistic Baseline: Within functional limits Type of Home: House  Lives With: Spouse Vocation:  (works Geologist, engineering)   Cognition  Overall Cognitive Status: Impaired/Different from baseline Arousal/Alertness: Awake/alert Orientation Level: Oriented X4 Safety/Judgment: Impaired Comments: decreased awareness of deficits    Comprehension  Auditory Comprehension Overall Auditory Comprehension: Impaired Yes/No Questions: Impaired Complex Questions: 75-100% accurate Commands: Impaired Multistep Basic Commands: 50-74% accurate Visual Recognition/Discrimination Discrimination: Within Function Limits Reading Comprehension Reading Status: Not tested    Expression Expression Primary Mode of Expression: Verbal Verbal Expression Overall Verbal Expression: Impaired Initiation: Impaired Level of Generative/Spontaneous Verbalization: Conversation Repetition: No impairment Naming: Impairment Responsive: 76-100% accurate Confrontation: Within functional limits Divergent: 25-49% accurate Verbal Errors: Phonemic paraphasias Pragmatics: No impairment Written Expression Dominant Hand: Left Written Expression: Exceptions to [facility] Self Formulation Ability: Phrase   Oral / Motor Oral Motor/Sensory Function Overall Oral Motor/Sensory Function: Other (comment) (left CN VII impairment) Motor Speech Overall Motor Speech: Impaired   Arlanda Shiplett L. Samson Frederic, [place] CCC/SLP Pager [phone]      Blenda Mounts Laurice 09/09/2014, 12:00 PM

## 2014-09-10 ENCOUNTER — Telehealth: Payer: Self-pay | Admitting: Physician Assistant

## 2014-09-10 ENCOUNTER — Other Ambulatory Visit: Payer: Self-pay | Admitting: Physician Assistant

## 2014-09-10 DIAGNOSIS — I4891 Unspecified atrial fibrillation: Secondary | ICD-10-CM

## 2014-09-10 DIAGNOSIS — I255 Ischemic cardiomyopathy: Secondary | ICD-10-CM

## 2014-09-10 DIAGNOSIS — I429 Cardiomyopathy, unspecified: Secondary | ICD-10-CM

## 2014-09-10 LAB — GLUCOSE, CAPILLARY
Glucose-Capillary: 96 mg/dL (ref 65–99)
Glucose-Capillary: 88 mg/dL (ref 65–99)

## 2014-09-10 MED ORDER — APIXABAN 5 MG PO TABS: 5.0000 mg | ORAL_TABLET | Freq: Two times a day (BID) | ORAL | Status: DC

## 2014-09-10 MED ORDER — METOPROLOL SUCCINATE ER 50 MG PO TB24: 50.0000 mg | ORAL_TABLET | Freq: Every day | ORAL | Status: DC

## 2014-09-10 MED ORDER — IRBESARTAN 150 MG PO TABS: 150.0000 mg | ORAL_TABLET | Freq: Every day | ORAL | Status: DC

## 2014-09-10 MED ORDER — SIMVASTATIN 20 MG PO TABS: 20.0000 mg | ORAL_TABLET | Freq: Every day | ORAL | Status: DC

## 2014-09-10 NOTE — Progress Notes (Signed)
Pt for discharge home today. Discharge orders received. IVs and telemetry dcd. Discharge instructions and 4 prescriptions given with verbalized understanding. Eliquis copay card given. Education and handouts given regarding adhering to medication regimen, smoking cessation, diet, exercise, and follow up appts with MDs. Family at bedside to assist with discharge. Staff brought patient to lobby via wheelchair at 1225. Transported to home by family member.

## 2014-09-10 NOTE — Telephone Encounter (Signed)
Patient DC from [facility]. Needs outpatient Myoview. Order in system. Also needs follow up with Dr. Rollene Rotunda in 2 weeks. Please call patient to arrange stress test and follow up appointment. Tereso Newcomer, PA-C   09/10/2014 9:37 AM

## 2014-09-10 NOTE — Discharge Summary (Addendum)
Physician Discharge Summary  Jeffrey Martin ONG:[id number] DOB: 11/08/1950 DOA: 09/06/2014  PCP: No PCP Per Patient  Admit date: 09/06/2014 Discharge date: 09/10/2014  Time spent: greater than 30 minutes  Recommendations for Outpatient Follow-up:   Home with home health  Outpatient  myoview  Discharge Diagnoses:  Primary problem: Acute ischemic stroke    Atrial fibrillation    Essential hypertension   HLD (hyperlipidemia)   Tobacco use disorder   Cardiomyopathy, ischemic   Discharge Condition: stable  Diet recommendation: heart healthy  Filed Weights   09/07/14 0434  Weight: 69.037 kg (152 lb 3.2 oz)    History of present illness:  64 y.o. male history of A fib presenting with 2 day history of speech difficulty and left sided weakness. Family reports acute onset 2 days ago, they note his speech has been different and his left side appears weaker with question of left facial droop. No sensory or visual deficits. No prior CVA or TIA history. No recent illness, no falls or head trauma. Ethanol and UDS negative. Recently diagnosed with A fib, not on anticoagulation. CT head imaging reviewed, shows no acute process. 2D echo on 6/27 with EF 35 to 40%. He was last known well 7/18 at 1200. Modified Rankin: Rankin Score=0. Patient was not administered TPA secondary to outside tPA window  Hospital Course:   Admitted to telemetry. Neurology consulted  Stroke: Right lenticular nucleaus/PLIC/corona radiata infarct as well as tiny R corona radiatia, poster R opercular and R temporal lobe infarcts, embolic secondary to known atrial fibrillation   Resultant Left facial droop and mild LUE weakness  MRI right lenticular nucleaus/PLIC/corona radiata infarct as well as tiny R corona radiatia, poster R opercular and R temporal lobe infarcts,   MRA R ICA occlusion  CTA head and neck showed right distal ICA occlusion  2D Echo EF 30-35%  LDL 77. Started on statin  HgbA1c  6.1  Atrial Fibrillation  chadsvasc 4. Started on eliquis. Cardiology consulted  Cardiomyopathy  EF 30-35% on echo  euvolemic at discharge  Had positive troponins.  Outpatient myoview per cardiology  Hypertension  Home meds: metoprolol  Metoprolol adjusted, started on avapro for better control and low ef  Hyperlipidemia  Home meds: none  LDL 77, goal < 70  Added zocor 20mg   Continue statin at discharge  Chronic kidney disease stage II -Stable, at baseline  Tobacco abuse -encourage cessation  Elevated troponin Outpatient stress test. Cardiology consulted and felt may be related to uncontrolled HTN, unlikely ACS  Consultants:  Neuro  cards  Procedures:  none  Discharge Exam: Filed Vitals:   09/10/14 0549  BP: 114/83  Pulse: 82  Temp: 98.6 F (37 C)  Resp: 18    General: a and o Cardiovascular: irreg irreg Respiratory: CTA Neuro: speech hesitant. Mild LUE weakness  Discharge Instructions   Discharge Instructions    Ambulatory referral to Neurology    Complete by:  As directed   Dr. Roda Shutters requests followup in 2 months     Diet - low sodium heart healthy    Complete by:  As directed      Driving Restrictions    Complete by:  As directed   No driving or operating heavy machinery until cleared by doctor     Increase activity slowly    Complete by:  As directed           Current Discharge Medication List    START taking these medications   Details  apixaban (ELIQUIS) 5  MG TABS tablet Take 1 tablet (5 mg total) by mouth 2 (two) times daily. Qty: 60 tablet, Refills: 1    irbesartan (AVAPRO) 150 MG tablet Take 1 tablet (150 mg total) by mouth daily. Qty: 30 tablet, Refills: 1    simvastatin (ZOCOR) 20 MG tablet Take 1 tablet (20 mg total) by mouth daily at 6 PM. Qty: 30 tablet, Refills: 1      CONTINUE these medications which have CHANGED   Details  metoprolol succinate (TOPROL-XL) 50 MG 24 hr tablet Take 1 tablet (50 mg  total) by mouth daily. Qty: 30 tablet, Refills: 1      CONTINUE these medications which have NOT CHANGED   Details  Multiple Vitamins-Minerals (MULTIVITAMIN & MINERAL PO) Take 1 tablet by mouth daily.      STOP taking these medications     aspirin 81 MG chewable tablet      Aspirin-Salicylamide-Caffeine (BC HEADACHE POWDER PO)        No Known Allergies Follow-up Information    Follow up with Xu,Jindong, MD In 2 months.   Specialty:  Neurology   Why:  Stroke Clinic, Office will call you with appointment date & time   Contact information:   [address] [place] [address] [phone]       Follow up with [facility]     On 09/13/2014.   Why:  Appointment on 7/26/ at 10:00 am with Dr. Armen Pickup.   Contact information:   [address] [place] [address] [phone]      Follow up with [facility].   Why:  their office will call you with appointment   Contact information:   [address] [place] [address] [phone]       The results of significant diagnostics from this hospitalization (including imaging, microbiology, ancillary and laboratory) are listed below for reference.    Significant Diagnostic Studies: Ct Angio Head W/cm &/or Wo Cm  09/07/2014   CLINICAL DATA:  Stroke and ICA occlusion.  Atrial fibrillation  EXAM: CT ANGIOGRAPHY HEAD AND NECK  TECHNIQUE: Multidetector CT imaging of the head and neck was performed using the standard protocol during bolus administration of intravenous contrast. Multiplanar CT image reconstructions and MIPs were obtained to evaluate the vascular anatomy. Carotid stenosis measurements (when applicable) are obtained utilizing NASCET criteria, using the distal internal carotid diameter as the denominator.  CONTRAST:  80mL OMNIPAQUE IOHEXOL 350 MG/ML SOLN  COMPARISON:  Brain MRI from  earlier the same day  FINDINGS: CTA NECK  Aortic arch: No aneurysm or dissection.  Standard branching.  Right carotid system: Patent brachiocephalic artery. Just beyond the ICA bifurcation there is graded nonvisualization of the lumen contrast. Mild calcified atherosclerotic plaque is present at the ICA bifurcation. No discrete dissection flap or ulcerated plaque is seen. There is intracranial reconstitution. Note that the upper right cervical ICA is visible on delayed phase, but there is no opacification at the level of the carotid terminus, suggesting there is focal thrombosis at this level with delayed enhancement proximally. Further discussed below.  Left carotid system: No stenosis or discrete atheromatous change. Tortuous ICA without changes of fibromuscular dysplasia.  Vertebral arteries:Mild left dominance. The vessels are widely patent into the head. No evidence of dissection or other acute disease.  Skeleton: No contributory findings.  Other neck: No significant incidental finding.  CTA HEAD  Anterior circulation: Intracranial right ICA occlusion with opacification  around a finger like projection suggesting recent clot/embolus. There is reconstituted flow in the right M1 segment via a broad anterior communicating artery (no aneurysm seen in this location as questioned on previous MRA). No downstream major branch occlusion is seen. No significant stenosis seen in the anterior cerebral or left middle cerebral branches.  Posterior circulation: No stenosis, aneurysm, or major branch occlusion.  Venous sinuses: Patent as permitted by timing.  Delayed phase: Enhancement of the upper cervical right ICA, as discussed above.  Low-density in the posterior limb right internal capsule and neighboring putamen correlating with infarct seen on preceding brain MRI. Other infarcts are not well visualized. There is generalized cerebral volume loss which is greater than expected for age, with chronic small vessel ischemia  best visualized around the lateral ventricles.  These results were called by telephone at the time of interpretation on 09/07/2014 at 2:05 pm to Dr. Marvel Plan , who verbally acknowledged these results.  IMPRESSION: 1. Occlusive embolus to the supraclinoid right ICA with good downstream flow via this anterior communicating artery. The right cervical ICA is likely patent, with delayed filling seen at the skullbase. 2. Mild atheromatous change, mainly at the right carotid bifurcation.   Electronically Signed   By: Marnee Spring M.D.   On: 09/07/2014 14:09   Dg Chest 2 View  09/07/2014   CLINICAL DATA:  64 year old male with altered mental status  EXAM: CHEST  2 VIEW  COMPARISON:  None.  FINDINGS: The heart size and mediastinal contours are within normal limits. Both lungs are clear. The visualized skeletal structures are unremarkable.  Surgical clips noted over the epigastric area.  IMPRESSION: No active cardiopulmonary disease.   Electronically Signed   By: Elgie Collard M.D.   On: 09/07/2014 00:28   Ct Head Wo Contrast  09/07/2014   CLINICAL DATA:  64 year old male with slurred speech  EXAM: CT HEAD WITHOUT CONTRAST  TECHNIQUE: Contiguous axial images were obtained from the base of the skull through the vertex without intravenous contrast.  COMPARISON:  None.  FINDINGS: The ventricles are dilated and the sulci are prominent compatible with age-related atrophy. Periventricular and deep white matter hypodensities represent chronic microvascular ischemic changes. There is no intracranial hemorrhage. No mass effect or midline shift identified.  The visualized paranasal sinuses and mastoid air cells are well aerated. The calvarium is intact.  IMPRESSION: No acute intracranial pathology.  Age-related atrophy and chronic microvascular ischemic disease.  If symptoms persist and there are no contraindications, MRI may provide better evaluation if clinically indicated.   Electronically Signed   By: Elgie Collard  M.D.   On: 09/07/2014 00:31   Ct Angio Neck W/cm &/or Wo/cm  09/07/2014   CLINICAL DATA:  Stroke and ICA occlusion.  Atrial fibrillation  EXAM: CT ANGIOGRAPHY HEAD AND NECK  TECHNIQUE: Multidetector CT imaging of the head and neck was performed using the standard protocol during bolus administration of intravenous contrast. Multiplanar CT image reconstructions and MIPs were obtained to evaluate the vascular anatomy. Carotid stenosis measurements (when applicable) are obtained utilizing NASCET criteria, using the distal internal carotid diameter as the denominator.  CONTRAST:  23mL OMNIPAQUE IOHEXOL 350 MG/ML SOLN  COMPARISON:  Brain MRI from earlier the same day  FINDINGS: CTA NECK  Aortic arch: No aneurysm or dissection.  Standard branching.  Right carotid system: Patent brachiocephalic artery. Just beyond the ICA bifurcation there is graded nonvisualization of the lumen contrast. Mild calcified atherosclerotic plaque is present at the ICA bifurcation. No discrete  dissection flap or ulcerated plaque is seen. There is intracranial reconstitution. Note that the upper right cervical ICA is visible on delayed phase, but there is no opacification at the level of the carotid terminus, suggesting there is focal thrombosis at this level with delayed enhancement proximally. Further discussed below.  Left carotid system: No stenosis or discrete atheromatous change. Tortuous ICA without changes of fibromuscular dysplasia.  Vertebral arteries:Mild left dominance. The vessels are widely patent into the head. No evidence of dissection or other acute disease.  Skeleton: No contributory findings.  Other neck: No significant incidental finding.  CTA HEAD  Anterior circulation: Intracranial right ICA occlusion with opacification around a finger like projection suggesting recent clot/embolus. There is reconstituted flow in the right M1 segment via a broad anterior communicating artery (no aneurysm seen in this location as  questioned on previous MRA). No downstream major branch occlusion is seen. No significant stenosis seen in the anterior cerebral or left middle cerebral branches.  Posterior circulation: No stenosis, aneurysm, or major branch occlusion.  Venous sinuses: Patent as permitted by timing.  Delayed phase: Enhancement of the upper cervical right ICA, as discussed above.  Low-density in the posterior limb right internal capsule and neighboring putamen correlating with infarct seen on preceding brain MRI. Other infarcts are not well visualized. There is generalized cerebral volume loss which is greater than expected for age, with chronic small vessel ischemia best visualized around the lateral ventricles.  These results were called by telephone at the time of interpretation on 09/07/2014 at 2:05 pm to Dr. Marvel Plan , who verbally acknowledged these results.  IMPRESSION: 1. Occlusive embolus to the supraclinoid right ICA with good downstream flow via this anterior communicating artery. The right cervical ICA is likely patent, with delayed filling seen at the skullbase. 2. Mild atheromatous change, mainly at the right carotid bifurcation.   Electronically Signed   By: Marnee Spring M.D.   On: 09/07/2014 14:09   Mri Brain Without Contrast  09/07/2014   CLINICAL DATA:  64 year old male with atrial fibrillation with 2 day history of speech difficulty and left-sided weakness. Subsequent encounter.  EXAM: MRI HEAD WITHOUT CONTRAST  MRA HEAD WITHOUT CONTRAST  TECHNIQUE: Multiplanar, multiecho pulse sequences of the brain and surrounding structures were obtained without intravenous contrast. Angiographic images of the head were obtained using MRA technique without contrast.  COMPARISON:  09/06/2014 head CT.  No comparison brain MR.  FINDINGS: MRI HEAD FINDINGS  Exam is motion degraded.  Acute nonhemorrhagic infarct right lenticular nucleus with extension into the posterior limb of the right internal capsule and the posterior  aspect of the right corona radiata. Smaller tiny acute infarct anterior right corona radiata, posterior right peri operculum region and right temporal lobe (lateral to the anterior aspect of the right temporal horn). Tiny acute infarct peripheral aspect of the junction of the right frontal- parietal lobe. No intracranial hemorrhage.  Mild to moderate small vessel disease type changes.  Abnormal appearance of the right internal carotid artery as noted below.  Plaque-like calcification/ossification left aspect of the falx. 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 Otherwise no evidence of intracranial mass detected on this unenhanced exam.  Global atrophy without hydrocephalus.  C3-4 bulge/protrusion with cord flattening.  Pituitary region, pineal region and orbital structures unremarkable.  Minimal mucosal thickening inferior left maxillary sinus.  MRA HEAD FINDINGS  Exam is motion degraded.  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 Collateral flow to the right carotid terminus.  No high-grade stenosis of the M1 segment of either middle cerebral artery. Poor delineation of majority of middle cerebral artery branch vessels bilaterally more notable on the right.  Ectatic anterior cerebral arteries. A tiny anterior communicating artery aneurysm cannot be excluded as questioned on source images. This could be reassessed on follow-up when patient is better able to cooperate.  Left vertebral artery slightly dominant in size. No significant stenosis of the distal vertebral arteries.  Mild to moderate narrowing mid aspect of the basilar artery. Nonvisualized anterior inferior cerebral arteries.  Poor delineation the left superior cerebellar artery with mild moderate narrowing right superior cerebral artery.  Mild narrowing and irregularity posterior cerebral artery distal branches greater on the left.  IMPRESSION: MRI HEAD  Exam is motion degraded.  Acute nonhemorrhagic infarct right  lenticular nucleus with extension into the posterior limb of the right internal capsule and the posterior aspect of the right corona radiata. Smaller tiny acute infarct anterior right corona radiata, posterior right peri operculum region and right temporal lobe. Tiny acute infarct peripheral aspect of the junction of the right frontal- parietal lobe.  No intracranial hemorrhage.  Mild to moderate small vessel disease type changes.  Plaque-like calcification/ossification left aspect of the falx. 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 Otherwise no evidence of intracranial mass detected on this unenhanced exam.  Global atrophy without hydrocephalus.  C3-4 bulge/protrusion with cord flattening.  MRA HEAD  Exam is motion degraded.  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 Collateral flow to the right carotid terminus.  No high-grade stenosis of the M1 segment of either middle cerebral artery. Poor delineation of majority of middle cerebral artery branch vessels bilaterally more notable on the right.  Ectatic anterior cerebral arteries. A tiny anterior communicating artery aneurysm cannot be excluded as questioned on source images. This could be reassessed on follow-up when patient is better able to cooperate.  Left vertebral artery slightly dominant in size. No significant stenosis of the distal vertebral arteries.  Mild to moderate narrowing mid aspect of the basilar artery. Nonvisualized anterior inferior cerebral arteries.   Electronically Signed   By: Lacy Duverney M.D.   On: 09/07/2014 06:51   Mr Maxine Glenn Head/brain Wo Cm  09/07/2014   CLINICAL DATA:  64 year old male with atrial fibrillation with 2 day history of speech difficulty and left-sided weakness. Subsequent encounter.  EXAM: MRI HEAD WITHOUT CONTRAST  MRA HEAD WITHOUT CONTRAST  TECHNIQUE: Multiplanar, multiecho pulse sequences of the brain and surrounding structures were obtained without intravenous contrast.  Angiographic images of the head were obtained using MRA technique without contrast.  COMPARISON:  09/06/2014 head CT.  No comparison brain MR.  FINDINGS: MRI HEAD FINDINGS  Exam is motion degraded.  Acute nonhemorrhagic infarct right lenticular nucleus with extension into the posterior limb of the right internal capsule and the posterior aspect of the right corona radiata. Smaller tiny acute infarct anterior right corona radiata, posterior right peri operculum region and right temporal lobe (lateral to the anterior aspect of the right temporal horn). Tiny acute infarct peripheral aspect of the junction of the right frontal- parietal lobe. No intracranial hemorrhage.  Mild to moderate small vessel disease type changes.  Abnormal appearance of the right internal carotid artery as noted below.  Plaque-like calcification/ossification left aspect of the falx. 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 Otherwise no evidence of intracranial mass detected on this unenhanced exam.  Global atrophy without hydrocephalus.  C3-4 bulge/protrusion with cord  flattening.  Pituitary region, pineal region and orbital structures unremarkable.  Minimal mucosal thickening inferior left maxillary sinus.  MRA HEAD FINDINGS  Exam is motion degraded.  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 Collateral flow to the right carotid terminus.  No high-grade stenosis of the M1 segment of either middle cerebral artery. Poor delineation of majority of middle cerebral artery branch vessels bilaterally more notable on the right.  Ectatic anterior cerebral arteries. A tiny anterior communicating artery aneurysm cannot be excluded as questioned on source images. This could be reassessed on follow-up when patient is better able to cooperate.  Left vertebral artery slightly dominant in size. No significant stenosis of the distal vertebral arteries.  Mild to moderate narrowing mid aspect of the basilar artery.  Nonvisualized anterior inferior cerebral arteries.  Poor delineation the left superior cerebellar artery with mild moderate narrowing right superior cerebral artery.  Mild narrowing and irregularity posterior cerebral artery distal branches greater on the left.  IMPRESSION: MRI HEAD  Exam is motion degraded.  Acute nonhemorrhagic infarct right lenticular nucleus with extension into the posterior limb of the right internal capsule and the posterior aspect of the right corona radiata. Smaller tiny acute infarct anterior right corona radiata, posterior right peri operculum region and right temporal lobe. Tiny acute infarct peripheral aspect of the junction of the right frontal- parietal lobe.  No intracranial hemorrhage.  Mild to moderate small vessel disease type changes.  Plaque-like calcification/ossification left aspect of the falx. 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 Otherwise no evidence of intracranial mass detected on this unenhanced exam.  Global atrophy without hydrocephalus.  C3-4 bulge/protrusion with cord flattening.  MRA HEAD  Exam is motion degraded.  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 Collateral flow to the right carotid terminus.  No high-grade stenosis of the M1 segment of either middle cerebral artery. Poor delineation of majority of middle cerebral artery branch vessels bilaterally more notable on the right.  Ectatic anterior cerebral arteries. A tiny anterior communicating artery aneurysm cannot be excluded as questioned on source images. This could be reassessed on follow-up when patient is better able to cooperate.  Left vertebral artery slightly dominant in size. No significant stenosis of the distal vertebral arteries.  Mild to moderate narrowing mid aspect of the basilar artery. Nonvisualized anterior inferior cerebral arteries.   Electronically Signed   By: Lacy Duverney M.D.   On: 09/07/2014 06:51   Echo Left ventricle: The cavity size  was normal. Systolic function was moderately to severely reduced. The estimated ejection fraction was in the range of 30% to 35%. Wall motion was normal; there were no regional wall motion abnormalities. - Ascending aorta: The ascending aorta was mildly dilated measuring 40 mm. - Mitral valve: There was moderate regurgitation. - Left atrium: The atrium was severely dilated. - Right ventricle: Systolic function was normal. - Right atrium: The atrium was moderately dilated. - Tricuspid valve: There was moderate regurgitation. - Pulmonary arteries: Systolic pressure was mildly increased. PA peak pressure: 39 mm Hg (S). - Inferior vena cava: The vessel was normal in size. - Pericardium, extracardiac: There was no pericardial effusion.  Impressions:  - There is no significant difference when compared to the prior study from 08/15/2014.  Microbiology: No results found for this or any previous visit (from the past 240 hour(s)).   Labs: Basic Metabolic Panel:  Recent Labs Lab 09/06/14 2302 09/07/14 0011 09/07/14 0525 09/09/14 0323  NA 139 141 139 140  K 6.7*  4.3 4.2 3.9  CL 108 108 107 106  CO2  --  24 22 25   GLUCOSE 99 93 93 106*  BUN 30* 20 18 11   CREATININE 1.60* 1.49* 1.29* 1.17  CALCIUM  --  9.5 9.1 8.9   Liver Function Tests:  Recent Labs Lab 09/07/14 0011  AST 23  ALT 17  ALKPHOS 94  BILITOT 1.0  PROT 7.7  ALBUMIN 3.9   No results for input(s): LIPASE, AMYLASE in the last 168 hours. No results for input(s): AMMONIA in the last 168 hours. CBC:  Recent Labs Lab 09/06/14 2254 09/06/14 2302 09/07/14 0808 09/09/14 0323  WBC 6.8  --  6.0 4.8  NEUTROABS 3.3  --   --   --   HGB 16.8 19.7* 16.3 15.6  HCT 50.3 58.0* 47.7 46.3  MCV 88.9  --  87.0 86.9  PLT 188  --  159 153   Cardiac Enzymes:  Recent Labs Lab 09/07/14 1048 09/07/14 1714 09/07/14 2212  TROPONINI 0.35* 0.27* 0.21*   BNP: BNP (last 3 results) No results for input(s): BNP in  the last 8760 hours.  ProBNP (last 3 results) No results for input(s): PROBNP in the last 8760 hours.  CBG:  Recent Labs Lab 09/09/14 0647 09/09/14 1131 09/09/14 1717 09/09/14 2211 09/10/14 0633  GLUCAP 94 118* 140* 120* 96       Signed:  Jennett Tarbell L  [facility] 09/10/2014, 9:27 AM

## 2014-09-12 NOTE — Telephone Encounter (Signed)
Voicemail was left for pt to call back and sched Myoview and f/u.   Revonda Standard

## 2014-09-13 ENCOUNTER — Ambulatory Visit: Payer: Medicaid Other | Attending: Family Medicine | Admitting: Family Medicine

## 2014-09-13 ENCOUNTER — Encounter: Payer: Self-pay | Admitting: Family Medicine

## 2014-09-13 ENCOUNTER — Telehealth (HOSPITAL_COMMUNITY): Payer: Self-pay | Admitting: *Deleted

## 2014-09-13 ENCOUNTER — Other Ambulatory Visit: Payer: Self-pay

## 2014-09-13 VITALS — BP 110/68 | HR 47 | Temp 97.5°F | Resp 16 | Ht 67.0 in | Wt 155.0 lb

## 2014-09-13 DIAGNOSIS — I4891 Unspecified atrial fibrillation: Secondary | ICD-10-CM

## 2014-09-13 DIAGNOSIS — I63411 Cerebral infarction due to embolism of right middle cerebral artery: Secondary | ICD-10-CM | POA: Diagnosis not present

## 2014-09-13 DIAGNOSIS — Z8673 Personal history of transient ischemic attack (TIA), and cerebral infarction without residual deficits: Secondary | ICD-10-CM | POA: Insufficient documentation

## 2014-09-13 DIAGNOSIS — Z720 Tobacco use: Secondary | ICD-10-CM | POA: Diagnosis not present

## 2014-09-13 DIAGNOSIS — F1010 Alcohol abuse, uncomplicated: Secondary | ICD-10-CM | POA: Insufficient documentation

## 2014-09-13 DIAGNOSIS — I509 Heart failure, unspecified: Secondary | ICD-10-CM | POA: Diagnosis not present

## 2014-09-13 DIAGNOSIS — F17200 Nicotine dependence, unspecified, uncomplicated: Secondary | ICD-10-CM

## 2014-09-13 DIAGNOSIS — I255 Ischemic cardiomyopathy: Secondary | ICD-10-CM | POA: Diagnosis not present

## 2014-09-13 DIAGNOSIS — F17210 Nicotine dependence, cigarettes, uncomplicated: Secondary | ICD-10-CM | POA: Diagnosis not present

## 2014-09-13 MED ORDER — NICOTINE 7 MG/24HR TD PT24: 7.0000 mg | MEDICATED_PATCH | Freq: Every day | TRANSDERMAL | Status: DC

## 2014-09-13 MED ORDER — NICOTINE 14 MG/24HR TD PT24: 14.0000 mg | MEDICATED_PATCH | Freq: Every day | TRANSDERMAL | Status: DC

## 2014-09-13 MED ORDER — METOPROLOL SUCCINATE ER 25 MG PO TB24: 25.0000 mg | ORAL_TABLET | Freq: Every day | ORAL | Status: DC

## 2014-09-13 NOTE — Telephone Encounter (Signed)
Patient given detailed instructions per Myocardial Perfusion Study Information Sheet for test on 09/16/14 at 0915. Patient Notified to arrive 15 minutes early, and that it is imperative to arrive on time for appointment to keep from having the test rescheduled. Patient verbalized understanding. Dessiree Sze, Adelene Idler

## 2014-09-13 NOTE — Progress Notes (Signed)
Establish care HFU  Hx tobacco 2 cigarette per day

## 2014-09-13 NOTE — Patient Instructions (Signed)
Mr. Shankel,  Thank you for coming in today. It was a pleasure meeting you. I look forward to being your primary doctor.  1. Smoking: Smoking cessation support: smoking cessation hotline: 1-800-QUIT-NOW.  Smoking cessation classes are available through [facility]. Call [phone] or visit our website at [email or web address].  Please use nicotine patch to help you quit smoking 14 mg for 6 weeks 7 mg for 2 weeks   2. A fib: Heart rate is low in 40s today Decrease metoprolol to 25 mg daily, new prescription sent  3. Recent stroke in setting of Afib and congestive heart failure:  Continue all medications Works toward quitting smoking Decrease alcohol to one beer a day, then one beer every other day then 1 beer only on weeks to help with congestive heart failure   Make and keep follow up with neurology and cardiology  Please apply for [employer] discount and orange card, you can also inquire if any of your medications are on the PASS (medications assistance) list.   F/u with me in 6 weeks for A fib, CHF, recent stroke   Dr. Armen Pickup

## 2014-09-13 NOTE — Progress Notes (Signed)
   Subjective:    Patient ID: Jeffrey Martin, male    DOB: 05/01/1950, 64 y.o.   MRN: [id number] CC: HFU A fib, CHF, stroke  HPI 64 yo M presents with his daughter in law for hospital follow up:  1. Stroke: in setting of A fib and CHF patient has acute nonhemorrhagic stroke. He is now rate controlled on metoprolol and anticoagulated with eliquis. He admits to drinking beer daily and smoking. He is working to quit smoking. He only neurological deficit is partial aphasia which is improving. Outpatient neurology follow up is pending.   2. A fib: rate controlled on metoprolol. No dizziness, lightheadedness, CP, palpitations. Anticoagulated with eliquis, no bleeding or bruising.   3. CHF: reduced EF CHF. No swelling or SOB. Compliant with metoprolol and avapro. Outpatient cardiology follow up is pending.   History  Substance Use Topics  . Smoking status: Current Every Day Smoker -- 0.30 packs/day    Types: Cigarettes  . Smokeless tobacco: Not on file  . Alcohol Use: Yes     Comment: rare  Med Hx: A fib  Surg Hx: repair of perforated ulcer  Review of Systems  Constitutional: Negative for fever, chills, fatigue and unexpected weight change.  Eyes: Negative for visual disturbance.  Respiratory: Negative for cough and shortness of breath.   Cardiovascular: Negative for chest pain, palpitations and leg swelling.  Gastrointestinal: Negative for nausea, vomiting, abdominal pain, diarrhea, constipation and blood in stool.  Musculoskeletal: Negative for myalgias, back pain, arthralgias, gait problem and neck pain.  Skin: Negative for rash.  Neurological: Positive for speech difficulty. Negative for dizziness, tremors, syncope, facial asymmetry, weakness, light-headedness, numbness and headaches.  Hematological: Does not bruise/bleed easily.      Objective:   Physical Exam BP 110/68 mmHg  Pulse 47  Temp(Src) 97.5 F (36.4 C) (Oral)  Resp 16  Ht 5\' 7"  (1.702 m)  Wt 155 lb (70.308 kg)   BMI 24.27 kg/m2  SpO2 100% General appearance: alert, cooperative and no distress Lungs: clear to auscultation bilaterally Heart: irregularly irregular rhythm Extremities: extremities normal, atraumatic, no cyanosis or edema Neurologic: Alert and oriented X 3, normal strength and tone. Normal symmetric reflexes. Normal coordination and gait speech is slow   EKG: atrial fibrillation, rate irregular average is 74.   MRI 09/07/2014: Acute nonhemorrhagic infarct right lenticular nucleus with extension into the posterior limb of the right internal capsule and the posterior aspect of the right corona radiata. Smaller tiny acute infarct anterior right corona radiata, posterior right peri operculum region and right temporal lobe. Tiny acute infarct peripheral aspect of the junction of the right frontal- parietal Lobe.   ECHO: 08/15/2014 and 09/07/2014 The estimated ejection fraction was in the range of 35% to 40%. Moderate diffuse hypokinesis with no identifiable regional variations. The study is not technically sufficient to allow evaluation of LV diastolic function.  EKGs: 08/25/14-09/08/14   A fib with HR 105-109     Assessment & Plan:

## 2014-09-13 NOTE — Assessment & Plan Note (Signed)
Recent stroke in setting of Afib and congestive heart failure:  Continue all medications Works toward quitting smoking Decrease alcohol to one beer a day, then one beer every other day then 1 beer only on weeks to help with congestive heart failure

## 2014-09-13 NOTE — Assessment & Plan Note (Signed)
A fib: Heart rate is low in 40s today Decrease metoprolol to 25 mg daily, new prescription sent

## 2014-09-13 NOTE — Assessment & Plan Note (Signed)
Smoking: Smoking cessation support: smoking cessation hotline: 1-800-QUIT-NOW.  Smoking cessation classes are available through [facility]. Call [phone] or visit our website at [email or web address].  Please use nicotine patch to help you quit smoking 14 mg for 6 weeks 7 mg for 2 weeks

## 2014-09-16 ENCOUNTER — Ambulatory Visit (HOSPITAL_COMMUNITY): Payer: Medicaid Other | Attending: Cardiology

## 2014-09-16 DIAGNOSIS — Z87891 Personal history of nicotine dependence: Secondary | ICD-10-CM | POA: Diagnosis not present

## 2014-09-16 DIAGNOSIS — I10 Essential (primary) hypertension: Secondary | ICD-10-CM | POA: Insufficient documentation

## 2014-09-16 DIAGNOSIS — Z8673 Personal history of transient ischemic attack (TIA), and cerebral infarction without residual deficits: Secondary | ICD-10-CM | POA: Insufficient documentation

## 2014-09-16 DIAGNOSIS — I429 Cardiomyopathy, unspecified: Secondary | ICD-10-CM | POA: Diagnosis not present

## 2014-09-16 DIAGNOSIS — I4891 Unspecified atrial fibrillation: Secondary | ICD-10-CM | POA: Insufficient documentation

## 2014-09-16 LAB — MYOCARDIAL PERFUSION IMAGING
CHL CUP NUCLEAR SSS: 8
LV dias vol: 138 mL
LV sys vol: 102 mL
Peak HR: 102 {beats}/min
RATE: 0.2
Rest HR: 72 {beats}/min
SDS: 7
SRS: 1
TID: 1.01

## 2014-09-16 MED ORDER — TECHNETIUM TC 99M SESTAMIBI GENERIC - CARDIOLITE
31.3000 | Freq: Once | INTRAVENOUS | Status: AC | PRN
  Administered 2014-09-16: 31.3 via INTRAVENOUS

## 2014-09-16 MED ORDER — REGADENOSON 0.4 MG/5ML IV SOLN
0.4000 mg | Freq: Once | INTRAVENOUS | Status: AC
  Administered 2014-09-16: 0.4 mg via INTRAVENOUS

## 2014-09-16 MED ORDER — TECHNETIUM TC 99M SESTAMIBI GENERIC - CARDIOLITE
9.5000 | Freq: Once | INTRAVENOUS | Status: AC | PRN
  Administered 2014-09-16: 10 via INTRAVENOUS

## 2014-09-19 ENCOUNTER — Encounter: Payer: Self-pay | Admitting: Physician Assistant

## 2014-09-19 ENCOUNTER — Telehealth: Payer: Self-pay | Admitting: *Deleted

## 2014-09-19 MED ORDER — IRBESARTAN 300 MG PO TABS: 300.0000 mg | ORAL_TABLET | Freq: Every day | ORAL | Status: DC

## 2014-09-19 NOTE — Telephone Encounter (Signed)
Home health nurse called today  Pt BP 142/97 HR 84 Knot on Lr arm and tingling  Per Dr Armen Pickup Increase Avapro t0 300mg  daily  If Sx worsen go to ER

## 2014-09-20 ENCOUNTER — Telehealth: Payer: Self-pay | Admitting: *Deleted

## 2014-09-20 NOTE — Telephone Encounter (Signed)
Pt notified of myoview results by phone with verbal understanding to results given today. Pt advised he needs to f/u w/Dr. Antoine Poche to discuss myoview further . Pt agreeable to plan of care. I advised [facility] will call with appt. Pt said ok and thank you.

## 2014-09-28 ENCOUNTER — Telehealth: Payer: Self-pay | Admitting: Family Medicine

## 2014-09-28 NOTE — Telephone Encounter (Signed)
Patient called requesting medication refill for irbesartan (AVAPRO). Please follow up.

## 2014-09-28 NOTE — Telephone Encounter (Signed)
Pt notified Rx was refill 09/19/2014 Call pharmacy for refills

## 2014-09-30 ENCOUNTER — Other Ambulatory Visit: Payer: Self-pay

## 2014-09-30 ENCOUNTER — Ambulatory Visit (INDEPENDENT_AMBULATORY_CARE_PROVIDER_SITE_OTHER): Payer: Medicaid Other | Admitting: Physician Assistant

## 2014-09-30 ENCOUNTER — Encounter: Payer: Self-pay | Admitting: Physician Assistant

## 2014-09-30 VITALS — BP 120/80 | HR 86 | Ht 67.0 in | Wt 154.0 lb

## 2014-09-30 DIAGNOSIS — R0602 Shortness of breath: Secondary | ICD-10-CM | POA: Diagnosis not present

## 2014-09-30 DIAGNOSIS — I255 Ischemic cardiomyopathy: Secondary | ICD-10-CM

## 2014-09-30 DIAGNOSIS — I4891 Unspecified atrial fibrillation: Secondary | ICD-10-CM

## 2014-09-30 MED ORDER — FUROSEMIDE 20 MG PO TABS: 20.0000 mg | ORAL_TABLET | Freq: Every day | ORAL | Status: DC

## 2014-09-30 NOTE — Progress Notes (Signed)
Cardiology Office Note   Date:  09/30/2014   ID:  Jeffrey Martin, DOB Nov 14, 1950, MRN [id number]  PCP:  Lora Paula, MD  Cardiologist:  Dr Weston Anna, PA-C   Chief Complaint  Patient presents with  . Atrial Fibrillation    History of Present Illness: Jeffrey Martin is a 64 y.o. male with a history of CVA 08/2014, A. fib and CHF. He has a history of EtOH and tobacco use. He was started on Eliquis in July for anticoagulation.Marland Kitchen He is on metoprolol for rate control.  Jeffrey Martin presents for follow-up of a Lexi scan Myoview that was high risk.  Jeffrey Martin never gets chest pain. He has had dyspnea on exertion. He is struggling to do the walking that he was doing, but denies orthopnea, PND or lower extremity edema. Before the stroke, he was walking a half mile at a time to go to the store. Currently he is struggling to do that. His daughter-in-law also reports that he is short of breath more easily than he used to be.  He is compliant with his medications, and has not had a cigarette or drink since he left the hospital. He is wearing a nicotine patch.  He is taking the Eliquis every day, but does not know how much it will cost him once the 30 day supply runs out. His daughter-in-law states that the social worker is trying to get him Medicaid.   Past Medical History  Diagnosis Date  . Peptic ulcer 1988  . Hypertension Dx June 2016  . Hyperlipidemia Dx July 2016  . History of cardiovascular stress test     Lexiscan Myoview 7/16:  EF 27%, possible inferoapical ischemia; High Risk  . Atrial fibrillation 07/2014    Past Surgical History  Procedure Laterality Date  . Repair of perforated ulcer      Current Outpatient Prescriptions  Medication Sig Dispense Refill  . apixaban (ELIQUIS) 5 MG TABS tablet Take 1 tablet (5 mg total) by mouth 2 (two) times daily. 60 tablet 1  . irbesartan (AVAPRO) 300 MG tablet Take 1 tablet (300 mg total) by mouth daily.  30 tablet 1  . metoprolol succinate (TOPROL-XL) 25 MG 24 hr tablet Take 1 tablet (25 mg total) by mouth daily. 30 tablet 2  . Multiple Vitamins-Minerals (MULTIVITAMIN & MINERAL PO) Take 1 tablet by mouth daily.    . nicotine (NICODERM CQ - DOSED IN MG/24 HOURS) 14 mg/24hr patch Place 1 patch (14 mg total) onto the skin daily. 14 patch 0  . simvastatin (ZOCOR) 20 MG tablet Take 1 tablet (20 mg total) by mouth daily at 6 PM. 30 tablet 1  . furosemide (LASIX) 20 MG tablet Take 1 tablet (20 mg total) by mouth daily. 30 tablet 6   No current facility-administered medications for this visit.    Allergies:   Review of patient's allergies indicates no known allergies.    Social History:  The patient  reports that he quit smoking about 3 weeks ago. His smoking use included Cigarettes. He started smoking about 3 weeks ago. He smoked 0.30 packs per day. He has quit using smokeless tobacco. He reports that he drinks alcohol. He reports that he does not use illicit drugs.   Family History:  The patient's family history includes Hypertension in his mother. There is no history of Heart attack or Stroke.    ROS:  Please see the history of present illness. All other systems are reviewed and negative.  PHYSICAL EXAM: VS:  BP 120/80 mmHg  Pulse 86  Ht 5\' 7"  (1.702 m)  Wt 154 lb (69.854 kg)  BMI 24.11 kg/m2  SpO2 86% , BMI Body mass index is 24.11 kg/(m^2). GEN: Well nourished, well developed, in no acute distress HEENT: normal, still has mild left facial droop Neck: Minimal JVD, hepatojugular reflux present. No carotid bruits, or masses Cardiac: Irregular rate and rhythm; no murmurs, rubs, or gallops,no edema  Respiratory:  Few rales bilaterally, decreased breath sounds bases, poor inspiratory effort. Respirations are not labored.  GI: soft, nontender, nondistended, + BS MS: no deformity or atrophy Skin: warm and dry, no rash Neuro:  Strength and sensation are intact,  left-sided weakness is  improving.  Psych: euthymic mood, full affect   EKG:  EKG is ordered today. The ekg ordered today demonstrates atrial fibrillation with occasional PVCs, heart rate 86   Recent Labs: 09/07/2014: ALT 17 09/09/2014: BUN 11; Creatinine, Ser 1.17; Hemoglobin 15.6; Platelets 153; Potassium 3.9; Sodium 140    Lipid Panel    Component Value Date/Time   CHOL 135 09/07/2014 0525   TRIG 50 09/07/2014 0525   HDL 48 09/07/2014 0525   CHOLHDL 2.8 09/07/2014 0525   VLDL 10 09/07/2014 0525   LDLCALC 77 09/07/2014 0525     Wt Readings from Last 3 Encounters:  09/30/14 154 lb (69.854 kg)  09/16/14 151 lb (68.493 kg)  09/13/14 155 lb (70.308 kg)     Other studies Reviewed: Additional studies/ records that were reviewed today include: hospital records and previous ECGs.  ASSESSMENT AND PLAN:  1.  Atrial fibrillation: Heart rate is currently controlled on a low dose of Toprol-XL. Continue this  2. Chronic anticoagulation: This patients CHA2DS2-VASc Score and unadjusted Ischemic Stroke Rate (% per year) is equal to 3.2 % stroke rate/year from a score of 3 Above score calculated as 1 point each if present [CHF, HTN, DM, Vascular=MI/PAD/Aortic Plaque, Age if 31-74, or Male], 2 points each if present [Age > 75, or Stroke/TIA/TE]  He is compliant with Eliquis, his family was encouraged to make sure you will be able to continue this after his free month is completed.   3. Hypoxia: The patient denies shortness of breath and has only minimal overload by exam. However, his oxygen saturation is between 81 and 86% on room air and there are reports that his activity level is decreased since leaving the hospital. He has dyspnea on exertion. His recorded oxygen saturations are 100% on room air prior to today. We will add a low dose of Lasix and have him return in 1 week for a BMET and to see if his symptoms have improved.  4. Cardiomyopathy: possibly ischemic. With recent CVA, will defer workup for now.  Discussed cath w/ pt and dtr-in-law. Recheck echo in 3 months and if EF still low, will need cath. +/- ICD.    Current medicines are reviewed at length with the patient today.  The patient does not have concerns regarding medicines.  The following changes have been made: Lasix 20 mg daily  Labs/ tests ordered today include:  Orders Placed This Encounter  Procedures  . Basic Metabolic Panel (BMET)  . EKG 12-Lead    Disposition:   FU with M.D./PA in 1 week with a BMET  Signed, Leanna Battles  09/30/2014 11:00 AM    [facility] [address] [place], [place], [place]  [address] Phone: [phone]; Fax: [phone]

## 2014-09-30 NOTE — Patient Instructions (Addendum)
Medication Instructions:   START TAKING  LASIX 20 MG ONCE A DAY     Labwork:  BMET NEXT WEEK     Testing/Procedures:  NONE ORDER TODAY       Follow-Up:  ALREADY SCHEDULED NOTED AT THE TOP OF OFFICE VISIT        Any Other Special Instructions Will Be Listed Below (If Applicable).

## 2014-10-07 ENCOUNTER — Telehealth: Payer: Self-pay | Admitting: Cardiovascular Disease

## 2014-10-07 ENCOUNTER — Encounter: Payer: Self-pay | Admitting: Cardiology

## 2014-10-07 ENCOUNTER — Ambulatory Visit (INDEPENDENT_AMBULATORY_CARE_PROVIDER_SITE_OTHER): Payer: Medicaid Other | Admitting: Cardiovascular Disease

## 2014-10-07 ENCOUNTER — Telehealth: Payer: Self-pay | Admitting: *Deleted

## 2014-10-07 ENCOUNTER — Other Ambulatory Visit (INDEPENDENT_AMBULATORY_CARE_PROVIDER_SITE_OTHER): Payer: Medicaid Other

## 2014-10-07 VITALS — BP 126/74 | HR 60 | Ht 67.0 in

## 2014-10-07 DIAGNOSIS — R0602 Shortness of breath: Secondary | ICD-10-CM | POA: Diagnosis not present

## 2014-10-07 DIAGNOSIS — R0902 Hypoxemia: Secondary | ICD-10-CM | POA: Diagnosis not present

## 2014-10-07 DIAGNOSIS — I429 Cardiomyopathy, unspecified: Secondary | ICD-10-CM

## 2014-10-07 DIAGNOSIS — I481 Persistent atrial fibrillation: Secondary | ICD-10-CM | POA: Diagnosis not present

## 2014-10-07 DIAGNOSIS — I4819 Other persistent atrial fibrillation: Secondary | ICD-10-CM

## 2014-10-07 DIAGNOSIS — I4891 Unspecified atrial fibrillation: Secondary | ICD-10-CM

## 2014-10-07 DIAGNOSIS — I255 Ischemic cardiomyopathy: Secondary | ICD-10-CM

## 2014-10-07 LAB — BASIC METABOLIC PANEL
BUN: 17 mg/dL (ref 6–23)
CO2: 27 mEq/L (ref 19–32)
Calcium: 9.1 mg/dL (ref 8.4–10.5)
Chloride: 107 mEq/L (ref 96–112)
Creatinine, Ser: 1.25 mg/dL (ref 0.40–1.50)
GFR: 75.29 mL/min (ref >60.00)
Glucose, Bld: 108 mg/dL — ABNORMAL HIGH (ref 70–99)
Potassium: 3.9 mEq/L (ref 3.5–5.1)
Sodium: 143 mEq/L (ref 135–145)

## 2014-10-07 LAB — CBC
HCT: 47.9 % (ref 39.0–52.0)
Hemoglobin: 15.7 g/dL (ref 13.0–17.0)
MCHC: 32.9 g/dL (ref 30.0–36.0)
MCV: 89.6 fl (ref 78.0–100.0)
PLATELETS: 180 10*3/uL (ref 150.0–400.0)
RBC: 5.34 Mil/uL (ref 4.22–5.81)
RDW: 14.1 % (ref 11.5–15.5)
WBC: 6.5 10*3/uL (ref 4.0–10.5)

## 2014-10-07 LAB — PROTIME-INR
INR: 1.6 ratio — AB (ref 0.8–1.0)
PROTHROMBIN TIME: 17.3 s — AB (ref 9.6–13.1)

## 2014-10-07 MED ORDER — FUROSEMIDE 40 MG PO TABS: 40.0000 mg | ORAL_TABLET | Freq: Every day | ORAL | Status: DC

## 2014-10-07 NOTE — Progress Notes (Signed)
Cardiology Office Note   Date:  10/07/2014   ID:  Jeffrey Martin, DOB 05-Jul-1950, MRN [id number]  PCP:  Lora Paula, MD  Cardiologist:  Dr. Antoine Poche    Chief Complaint  Patient presents with  . Atrial Fibrillation    hypoxia       History of Present Illness: Jeffrey Martin is a 64 y.o. male who presents for follow up -was noted to have hypoxia on last visit, and lasix added.  Today his breathing is improved and he is walking without as much trouble.    His recent history includes admit with syncope and AMS in 07/2014 and found to be in a fib.  He was followed in a fib clinic.  Unsure how long he had been in a fib.  Also issues with alcohol and compliance.  Echo was obtained and EF 35-40%. With Mild to Mod regurg. Lt atrium mildly dilated, mod TR.  Chadsvasc score of 0-1 Continued ASA. Post echo BB was added.   In July he was admitted with CVA.  He also had  A. fib and CHF. He has a history of EtOH and tobacco use. He was started on Eliquis in July for anticoagulation.  Now CHA2DS2VASc score now 3.  He is on metoprolol for rate control and ARB for cardiomyopathy.  Nuc study Lexi scan Myoview was high risk no ischemia but low EF 27%.  Repeat Echo with EF 30-35%. Mod MR, LA now severely dilated, Ra mod. Dilated, moderate TR.  Though no significant difference.     He denies chest pain. He has had dyspnea on exertion. He is struggling to do the walking that he was doing, but denies orthopnea, PND or lower extremity edema. Before the stroke, he was walking a half mile at a time to go to the store. Currently he is struggling to do that. His daughter-in-law also reports that he is short of breath more easily than he used to be. He states he is not drinking or smoking.  He is taking Eliquis daily.   On arrival sp02 was 88% and with walking around office sp02 would drop to 84%.  He did not feel SOB.     Past Medical History  Diagnosis Date  . Peptic ulcer 1988  . Hypertension Dx  June 2016  . Hyperlipidemia Dx July 2016  . History of cardiovascular stress test     Lexiscan Myoview 7/16:  EF 27%, possible inferoapical ischemia; High Risk  . Atrial fibrillation 07/2014    Past Surgical History  Procedure Laterality Date  . Repair of perforated ulcer       Current Outpatient Prescriptions  Medication Sig Dispense Refill  . apixaban (ELIQUIS) 5 MG TABS tablet Take 1 tablet (5 mg total) by mouth 2 (two) times daily. 60 tablet 1  . furosemide (LASIX) 40 MG tablet Take 1 tablet (40 mg total) by mouth daily. 30 tablet 6  . irbesartan (AVAPRO) 300 MG tablet Take 1 tablet (300 mg total) by mouth daily. 30 tablet 1  . metoprolol succinate (TOPROL-XL) 25 MG 24 hr tablet Take 1 tablet (25 mg total) by mouth daily. 30 tablet 2  . Multiple Vitamins-Minerals (MULTIVITAMIN & MINERAL PO) Take 1 tablet by mouth daily.    . nicotine (NICODERM CQ - DOSED IN MG/24 HOURS) 14 mg/24hr patch Place 1 patch (14 mg total) onto the skin daily. 14 patch 0  . simvastatin (ZOCOR) 20 MG tablet Take 1 tablet (20 mg total) by mouth daily at  6 PM. 30 tablet 1   No current facility-administered medications for this visit.    Allergies:   Review of patient's allergies indicates no known allergies.    Social History:  The patient  reports that he quit smoking about 4 weeks ago. His smoking use included Cigarettes. He started smoking about 4 weeks ago. He smoked 0.30 packs per day. He has quit using smokeless tobacco. He reports that he drinks alcohol. He reports that he does not use illicit drugs.   Family History:  The patient's family history includes Hypertension in his mother. There is no history of Heart attack or Stroke.    ROS:  General:no colds or fevers, no weight changes Skin:no rashes or ulcers HEENT:no blurred vision, no congestion CV:see HPI PUL:see HPI GI:no diarrhea constipation or melena, no indigestion GU:no hematuria, no dysuria MS:no joint pain, no claudication Neuro:no  syncope, no lightheadedness Endo:no diabetes, no thyroid disease  Wt Readings from Last 3 Encounters:  09/30/14 154 lb (69.854 kg)  09/16/14 151 lb (68.493 kg)  09/13/14 155 lb (70.308 kg)     PHYSICAL EXAM: VS:  BP 126/74 mmHg  Pulse 60  Ht 5\' 7"  (1.702 m)  SpO2 87% , BMI There is no weight on file to calculate BMI. General:Pleasant affect, NAD Skin:Warm and dry, brisk capillary refill HEENT:normocephalic, sclera clear, mucus membranes moist Neck:supple, no JVD, no bruits  Heart:S1S2 RRR without murmur, gallup, rub or click Lungs:clear without rales, rhonchi, or wheezes UPJ:SRPR, non tender, + BS, do not palpate liver spleen or masses Ext:no lower ext edema, 2+ pedal pulses, 2+ radial pulses Neuro:alert and oriented, MAE, follows commands, + facial symmetry    EKG:  EKG is ordered today.  EKG with a fib rate controlled, with incomplete RBBB, t wave inversions in lateral leads, since June and with LVH.     Recent Labs: 09/07/2014: ALT 17 09/09/2014: BUN 11; Creatinine, Ser 1.17; Hemoglobin 15.6; Platelets 153; Potassium 3.9; Sodium 140    Lipid Panel    Component Value Date/Time   CHOL 135 09/07/2014 0525   TRIG 50 09/07/2014 0525   HDL 48 09/07/2014 0525   CHOLHDL 2.8 09/07/2014 0525   VLDL 10 09/07/2014 0525   LDLCALC 77 09/07/2014 0525       Other studies Reviewed: Additional studies/ records that were reviewed today include: previous records, echos, and nuc study. .   ASSESSMENT AND PLAN:  1.  Cardiomyopathy - with continued drop in EF mildly possibly due to HTN, a fib though rate controlled.  Discussed with Dr. Elease Hashimoto DOD, with low EF, and continued hypoxia  plan will be for Rt and Lt heart cath.  Will hold eliquis 2 days prior to cath.  And resume post procedure.  On BB, ARB, Statin, lasix.   2. Hypoxia- 84% with walking  Will increase lasix to 40 mg to see if helps with oxygen level.  Will also arrange home oxygen - he was hypoxic on last visit as well.   He has had labs today.   3.  A fib.  Rate controlled on current meds.   4. CVA, embolic, on eliquis.    Dr. Elease Hashimoto also spoke with the pt.   The patient understands that risks included but are not limited to stroke (1 in 1000), death (1 in 1000), kidney failure [usually temporary] (1 in 500), bleeding (1 in 200), allergic reaction [possibly serious] (1 in 200).   Current medicines are reviewed with the patient today.  The patient Has  no concerns regarding medicines.  The following changes have been made:  See above Labs/ tests ordered today include:see above  Disposition:   FU:  see above  Nyoka Lint, NP  10/07/2014 12:16 PM    [facility] [address] [place], [place], [place]  [address]/ [address] [place], [place] Phone: [phone]; Fax: [phone]  [phone]

## 2014-10-07 NOTE — Telephone Encounter (Signed)
Spoke with Melissa at [facility] regarding patient's oxygen order.  Melissa advised that patient would have to pay out of pocket for oxygen unless he has confirmation of Medicaid application.  She advised that her office will need either verbal or fax confirmation that application has been filed.  I called and spoke with patient who states he thinks he has filed the application but does not have any information or confirmation number.  He gave me name and phone number for his social worker:  Jeffrey Martin at [facility] [phone], ext. [phone] I called and left a message for Arline Asp of the reason for my call and a request for a call back.

## 2014-10-07 NOTE — Patient Instructions (Addendum)
Medication Instructions:   START TAKING LASIX 40 MG ONCE A DAY    Labwork:  CBC AND INR    Testing/Procedures:  Your physician has requested that you have a cardiac catheterization. Cardiac catheterization is used to diagnose and/or treat various heart conditions. Doctors may recommend this procedure for a number of different reasons. The most common reason is to evaluate chest pain. Chest pain can be a symptom of coronary artery disease (CAD), and cardiac catheterization can show whether plaque is narrowing or blocking your heart's arteries. This procedure is also used to evaluate the valves, as well as measure the blood flow and oxygen levels in different parts of your heart. For further information please visit [email or web address] Please follow instruction sheet, as given.   *YOU HAVE BEEN SCHEDULED FOR A LEFT AND RIGHT HEART CATH  WITH DR COOPER  ON 10/10/14 @  7:30 AM   *PLEASE ARRIVE @ 5:30 AM NORTH TOWER ENTRANCE TO BE DIRECT TO ADMITTING  *PLEASE HAVE NOTHING TO EAST OR DRINK AFTER MIDNIGHT THE NIGHT BEFORE YOUR PROCEDURE   *HOLD YOUR ELIQUIS TWO DAYS PRIOR TO YOUR PROCEDURE   *YOU MAY TAKE ALL YOUR MORNING MEDS WITH A SMALL AMOUNT OF WATER   * MAKE SURE YOU HAVE A CHANGE OF CLOTHING JUST  IN CASE YOU MAY NEED THEM      Follow-Up:   IN 4 TO 6 WEEKS AFTER 10/10/14  POST CATH FOLLOW UP WITH DR HOCREIN     Any Other Special Instructions Will Be Listed Below (If Applicable).  RECCOMMENDED TO BE PUT ON OXYGEN WITH [facility]   YOUR INFORMATION WILL BE SENT TO THEM   SOME ONE WILL CONTACT YOUR FOR FURTHER STEPS WITH THIS PROCESS        Stop Eliquis 2 days prior to cath

## 2014-10-07 NOTE — Telephone Encounter (Signed)
-----   Message from Leone Brand, NP sent at 10/07/2014  1:16 PM EDT ----- Rip [place]bour for cath, though he should hold the lasix the day before the cath and the day of the cath.

## 2014-10-07 NOTE — Telephone Encounter (Signed)
New message    Nurse calling in regards to order for oxygen Nurse states pt doesn't have insurance Nurse needs to know how to move forward Please call to discuss

## 2014-10-10 ENCOUNTER — Encounter (HOSPITAL_COMMUNITY)
Admission: RE | Disposition: A | Discharge status: Home / Self Care | Payer: Self-pay | Source: Ambulatory Visit | Attending: Cardiovascular Disease

## 2014-10-10 ENCOUNTER — Ambulatory Visit (HOSPITAL_COMMUNITY)
Admission: RE | Admit: 2014-10-10 | Discharge: 2014-10-10 | Disposition: A | Discharge status: Home / Self Care | Payer: Medicaid Other | Source: Ambulatory Visit | Attending: Cardiovascular Disease | Admitting: Cardiovascular Disease

## 2014-10-10 ENCOUNTER — Encounter (HOSPITAL_COMMUNITY): Payer: Self-pay | Admitting: Cardiovascular Disease

## 2014-10-10 DIAGNOSIS — Z7901 Long term (current) use of anticoagulants: Secondary | ICD-10-CM | POA: Diagnosis not present

## 2014-10-10 DIAGNOSIS — I429 Cardiomyopathy, unspecified: Secondary | ICD-10-CM | POA: Insufficient documentation

## 2014-10-10 DIAGNOSIS — E785 Hyperlipidemia, unspecified: Secondary | ICD-10-CM | POA: Insufficient documentation

## 2014-10-10 DIAGNOSIS — I4510 Unspecified right bundle-branch block: Secondary | ICD-10-CM | POA: Diagnosis not present

## 2014-10-10 DIAGNOSIS — R0902 Hypoxemia: Secondary | ICD-10-CM | POA: Insufficient documentation

## 2014-10-10 DIAGNOSIS — I5022 Chronic systolic (congestive) heart failure: Secondary | ICD-10-CM

## 2014-10-10 DIAGNOSIS — Z8673 Personal history of transient ischemic attack (TIA), and cerebral infarction without residual deficits: Secondary | ICD-10-CM | POA: Insufficient documentation

## 2014-10-10 DIAGNOSIS — I4891 Unspecified atrial fibrillation: Secondary | ICD-10-CM | POA: Insufficient documentation

## 2014-10-10 DIAGNOSIS — I10 Essential (primary) hypertension: Secondary | ICD-10-CM | POA: Diagnosis not present

## 2014-10-10 DIAGNOSIS — Z87891 Personal history of nicotine dependence: Secondary | ICD-10-CM | POA: Insufficient documentation

## 2014-10-10 HISTORY — PX: CARDIAC CATHETERIZATION: SHX172

## 2014-10-10 SURGERY — RIGHT/LEFT HEART CATH AND CORONARY ANGIOGRAPHY
Anesthesia: LOCAL

## 2014-10-10 MED ORDER — HEPARIN (PORCINE) IN NACL 2-0.9 UNIT/ML-% IJ SOLN
INTRAMUSCULAR | Status: AC
  Filled 2014-10-10: qty 1000

## 2014-10-10 MED ORDER — SODIUM CHLORIDE 0.9 % IJ SOLN: 3.0000 mL | Freq: Two times a day (BID) | INTRAMUSCULAR | Status: DC

## 2014-10-10 MED ORDER — MIDAZOLAM HCL 2 MG/2ML IJ SOLN
INTRAMUSCULAR | Status: DC | PRN
  Administered 2014-10-10: 2 mg via INTRAVENOUS

## 2014-10-10 MED ORDER — SODIUM CHLORIDE 0.9 % IV SOLN: 250.0000 mL | INTRAVENOUS | Status: DC | PRN

## 2014-10-10 MED ORDER — HEPARIN SODIUM (PORCINE) 1000 UNIT/ML IJ SOLN
INTRAMUSCULAR | Status: DC | PRN
  Administered 2014-10-10: 4000 [IU] via INTRAVENOUS

## 2014-10-10 MED ORDER — ACETAMINOPHEN 325 MG PO TABS: 650.0000 mg | ORAL_TABLET | ORAL | Status: DC | PRN

## 2014-10-10 MED ORDER — LIDOCAINE HCL (PF) 1 % IJ SOLN
INTRAMUSCULAR | Status: DC | PRN
  Administered 2014-10-10: 08:00:00

## 2014-10-10 MED ORDER — VERAPAMIL HCL 2.5 MG/ML IV SOLN
INTRAVENOUS | Status: DC | PRN
  Administered 2014-10-10: 08:00:00 via INTRA_ARTERIAL

## 2014-10-10 MED ORDER — HEPARIN SODIUM (PORCINE) 1000 UNIT/ML IJ SOLN
INTRAMUSCULAR | Status: AC
  Filled 2014-10-10: qty 1

## 2014-10-10 MED ORDER — IOHEXOL 350 MG/ML SOLN
INTRAVENOUS | Status: DC | PRN
  Administered 2014-10-10: 30 mL via INTRA_ARTERIAL

## 2014-10-10 MED ORDER — VERAPAMIL HCL 2.5 MG/ML IV SOLN
INTRAVENOUS | Status: AC
  Filled 2014-10-10: qty 2

## 2014-10-10 MED ORDER — MIDAZOLAM HCL 2 MG/2ML IJ SOLN
INTRAMUSCULAR | Status: AC
  Filled 2014-10-10: qty 4

## 2014-10-10 MED ORDER — FENTANYL CITRATE (PF) 100 MCG/2ML IJ SOLN
INTRAMUSCULAR | Status: DC | PRN
  Administered 2014-10-10: 25 ug via INTRAVENOUS

## 2014-10-10 MED ORDER — ONDANSETRON HCL 4 MG/2ML IJ SOLN: 4.0000 mg | Freq: Four times a day (QID) | INTRAMUSCULAR | Status: DC | PRN

## 2014-10-10 MED ORDER — NITROGLYCERIN 1 MG/10 ML FOR IR/CATH LAB
INTRA_ARTERIAL | Status: AC
  Filled 2014-10-10: qty 10

## 2014-10-10 MED ORDER — FENTANYL CITRATE (PF) 100 MCG/2ML IJ SOLN
INTRAMUSCULAR | Status: AC
  Filled 2014-10-10: qty 4

## 2014-10-10 MED ORDER — ASPIRIN 81 MG PO CHEW
81.0000 mg | CHEWABLE_TABLET | ORAL | Status: AC
  Administered 2014-10-10: 81 mg via ORAL

## 2014-10-10 MED ORDER — ASPIRIN 81 MG PO CHEW
CHEWABLE_TABLET | ORAL | Status: AC
  Filled 2014-10-10: qty 1

## 2014-10-10 MED ORDER — SODIUM CHLORIDE 0.9 % IJ SOLN: 3.0000 mL | INTRAMUSCULAR | Status: DC | PRN

## 2014-10-10 MED ORDER — LIDOCAINE HCL (PF) 1 % IJ SOLN
INTRAMUSCULAR | Status: AC
  Filled 2014-10-10: qty 30

## 2014-10-10 MED ORDER — SODIUM CHLORIDE 0.9 % IV SOLN: INTRAVENOUS | Status: DC

## 2014-10-10 MED ORDER — SODIUM CHLORIDE 0.9 % IV SOLN
INTRAVENOUS | Status: DC
Start: 2014-10-10 — End: 2014-10-10
  Administered 2014-10-10: 07:00:00 via INTRAVENOUS

## 2014-10-10 SURGICAL SUPPLY — 12 items
CATH INFINITI 5 FR JL3.5 (CATHETERS) ×2 IMPLANT
CATH INFINITI 5FR ANG PIGTAIL (CATHETERS) ×2 IMPLANT
CATH INFINITI JR4 5F (CATHETERS) ×2 IMPLANT
DEVICE RAD COMP TR BAND LRG (VASCULAR PRODUCTS) ×2 IMPLANT
GLIDESHEATH SLEND SS 6F .021 (SHEATH) ×2 IMPLANT
KIT HEART LEFT (KITS) ×2 IMPLANT
KIT HEART RIGHT NAMIC (KITS) IMPLANT
PACK CARDIAC CATHETERIZATION (CUSTOM PROCEDURE TRAY) ×2 IMPLANT
TRANSDUCER W/STOPCOCK (MISCELLANEOUS) ×2 IMPLANT
TUBING CIL FLEX 10 FLL-RA (TUBING) ×2 IMPLANT
WIRE HI TORQ VERSACORE-J 145CM (WIRE) ×2 IMPLANT
WIRE SAFE-T 1.5MM-J .035X260CM (WIRE) ×2 IMPLANT

## 2014-10-10 NOTE — H&P (View-Only) (Signed)
 Cardiology Office Note   Date:  10/07/2014   ID:  Jeffrey Martin, DOB 09/22/1950, MRN [id number]  PCP:  FUNCHES, JOSALYN C, MD  Cardiologist:  Dr. Hochrein    Chief Complaint  Patient presents with  . Atrial Fibrillation    hypoxia       History of Present Illness: Jeffrey Martin is a 63 y.o. male who presents for follow up -was noted to have hypoxia on last visit, and lasix added.  Today his breathing is improved and he is walking without as much trouble.    His recent history includes admit with syncope and AMS in 07/2014 and found to be in a fib.  He was followed in a fib clinic.  Unsure how long he had been in a fib.  Also issues with alcohol and compliance.  Echo was obtained and EF 35-40%. With Mild to Mod regurg. Lt atrium mildly dilated, mod TR.  Chadsvasc score of 0-1 Continued ASA. Post echo BB was added.   In July he was admitted with CVA.  He also had  A. fib and CHF. He has a history of EtOH and tobacco use. He was started on Eliquis in July for anticoagulation.  Now CHA2DS2VASc score now 3.  He is on metoprolol for rate control and ARB for cardiomyopathy.  Nuc study Lexi scan Myoview was high risk no ischemia but low EF 27%.  Repeat Echo with EF 30-35%. Mod MR, LA now severely dilated, Ra mod. Dilated, moderate TR.  Though no significant difference.     He denies chest pain. He has had dyspnea on exertion. He is struggling to do the walking that he was doing, but denies orthopnea, PND or lower extremity edema. Before the stroke, he was walking a half mile at a time to go to the store. Currently he is struggling to do that. His daughter-in-law also reports that he is short of breath more easily than he used to be. He states he is not drinking or smoking.  He is taking Eliquis daily.   On arrival sp02 was 88% and with walking around office sp02 would drop to 84%.  He did not feel SOB.     Past Medical History  Diagnosis Date  . Peptic ulcer 1988  . Hypertension Dx  June 2016  . Hyperlipidemia Dx July 2016  . History of cardiovascular stress test     Lexiscan Myoview 7/16:  EF 27%, possible inferoapical ischemia; High Risk  . Atrial fibrillation 07/2014    Past Surgical History  Procedure Laterality Date  . Repair of perforated ulcer       Current Outpatient Prescriptions  Medication Sig Dispense Refill  . apixaban (ELIQUIS) 5 MG TABS tablet Take 1 tablet (5 mg total) by mouth 2 (two) times daily. 60 tablet 1  . furosemide (LASIX) 40 MG tablet Take 1 tablet (40 mg total) by mouth daily. 30 tablet 6  . irbesartan (AVAPRO) 300 MG tablet Take 1 tablet (300 mg total) by mouth daily. 30 tablet 1  . metoprolol succinate (TOPROL-XL) 25 MG 24 hr tablet Take 1 tablet (25 mg total) by mouth daily. 30 tablet 2  . Multiple Vitamins-Minerals (MULTIVITAMIN & MINERAL PO) Take 1 tablet by mouth daily.    . nicotine (NICODERM CQ - DOSED IN MG/24 HOURS) 14 mg/24hr patch Place 1 patch (14 mg total) onto the skin daily. 14 patch 0  . simvastatin (ZOCOR) 20 MG tablet Take 1 tablet (20 mg total) by mouth daily at   6 PM. 30 tablet 1   No current facility-administered medications for this visit.    Allergies:   Review of patient's allergies indicates no known allergies.    Social History:  The patient  reports that he quit smoking about 4 weeks ago. His smoking use included Cigarettes. He started smoking about 4 weeks ago. He smoked 0.30 packs per day. He has quit using smokeless tobacco. He reports that he drinks alcohol. He reports that he does not use illicit drugs.   Family History:  The patient's family history includes Hypertension in his mother. There is no history of Heart attack or Stroke.    ROS:  General:no colds or fevers, no weight changes Skin:no rashes or ulcers HEENT:no blurred vision, no congestion CV:see HPI PUL:see HPI GI:no diarrhea constipation or melena, no indigestion GU:no hematuria, no dysuria MS:no joint pain, no claudication Neuro:no  syncope, no lightheadedness Endo:no diabetes, no thyroid disease  Wt Readings from Last 3 Encounters:  09/30/14 154 lb (69.854 kg)  09/16/14 151 lb (68.493 kg)  09/13/14 155 lb (70.308 kg)     PHYSICAL EXAM: VS:  BP 126/74 mmHg  Pulse 60  Ht 5' 7" (1.702 m)  SpO2 87% , BMI There is no weight on file to calculate BMI. General:Pleasant affect, NAD Skin:Warm and dry, brisk capillary refill HEENT:normocephalic, sclera clear, mucus membranes moist Neck:supple, no JVD, no bruits  Heart:S1S2 RRR without murmur, gallup, rub or click Lungs:clear without rales, rhonchi, or wheezes Abd:soft, non tender, + BS, do not palpate liver spleen or masses Ext:no lower ext edema, 2+ pedal pulses, 2+ radial pulses Neuro:alert and oriented, MAE, follows commands, + facial symmetry    EKG:  EKG is ordered today.  EKG with a fib rate controlled, with incomplete RBBB, t wave inversions in lateral leads, since June and with LVH.     Recent Labs: 09/07/2014: ALT 17 09/09/2014: BUN 11; Creatinine, Ser 1.17; Hemoglobin 15.6; Platelets 153; Potassium 3.9; Sodium 140    Lipid Panel    Component Value Date/Time   CHOL 135 09/07/2014 0525   TRIG 50 09/07/2014 0525   HDL 48 09/07/2014 0525   CHOLHDL 2.8 09/07/2014 0525   VLDL 10 09/07/2014 0525   LDLCALC 77 09/07/2014 0525       Other studies Reviewed: Additional studies/ records that were reviewed today include: previous records, echos, and nuc study. .   ASSESSMENT AND PLAN:  1.  Cardiomyopathy - with continued drop in EF mildly possibly due to HTN, a fib though rate controlled.  Discussed with Dr. Nahser DOD, with low EF, and continued hypoxia  plan will be for Rt and Lt heart cath.  Will hold eliquis 2 days prior to cath.  And resume post procedure.  On BB, ARB, Statin, lasix.   2. Hypoxia- 84% with walking  Will increase lasix to 40 mg to see if helps with oxygen level.  Will also arrange home oxygen - he was hypoxic on last visit as well.   He has had labs today.   3.  A fib.  Rate controlled on current meds.   4. CVA, embolic, on eliquis.    Dr. Nahser also spoke with the pt.   The patient understands that risks included but are not limited to stroke (1 in 1000), death (1 in 1000), kidney failure [usually temporary] (1 in 500), bleeding (1 in 200), allergic reaction [possibly serious] (1 in 200).   Current medicines are reviewed with the patient today.  The patient Has   no concerns regarding medicines.  The following changes have been made:  See above Labs/ tests ordered today include:see above  Disposition:   FU:  see above  Signed, INGOLD,LAURA R, NP  10/07/2014 12:16 PM    [facility] [address], [place], [place]  [address]/ [address] [place], [place] Phone: [phone]; Fax: [phone]  [phone]   

## 2014-10-10 NOTE — Telephone Encounter (Signed)
Left message for Lorrin Mais at [facility] to call me regarding status of patient's Medicaid application

## 2014-10-10 NOTE — Progress Notes (Signed)
Multiple IV attempts by 3 RNs . Told by Clydie Braun from cath lab to bring pt over with 1 established IV.  Pt taken to cath lab with a patent IV as instructed.

## 2014-10-10 NOTE — Interval H&P Note (Signed)
History and Physical Interval Note:  10/10/2014 7:48 AM  Jeffrey Martin  has presented today for surgery, with the diagnosis of Congestive Heart Failure/Abnormal Stress Test  The various methods of treatment have been discussed with the patient and family. After consideration of risks, benefits and other options for treatment, the patient has consented to  Procedure(s): Right/Left Heart Cath and Coronary Angiography (N/A) as a surgical intervention .  The patient's history has been reviewed, patient examined, no change in status, stable for surgery.  I have reviewed the patient's chart and labs.  Questions were answered to the patient's satisfaction.     Tonny Bollman

## 2014-10-10 NOTE — Discharge Instructions (Signed)
Radial Site Care °Refer to this sheet in the next few weeks. These instructions provide you with information on caring for yourself after your procedure. Your caregiver may also give you more specific instructions. Your treatment has been planned according to current medical practices, but problems sometimes occur. Call your caregiver if you have any problems or questions after your procedure. °HOME CARE INSTRUCTIONS °· You may shower the day after the procedure. Remove the bandage (dressing) and gently wash the site with plain soap and water. Gently pat the site dry. °· Do not apply powder or lotion to the site. °· Do not submerge the affected site in water for 3 to 5 days. °· Inspect the site at least twice daily. °· Do not flex or bend the affected arm for 24 hours. °· No lifting over 5 pounds (2.3 kg) for 5 days after your procedure. °· Do not drive home if you are discharged the same day of the procedure. Have someone else drive you. °· You may drive 24 hours after the procedure unless otherwise instructed by your caregiver. °· Do not operate machinery or power tools for 24 hours. °· A responsible adult should be with you for the first 24 hours after you arrive home. °What to expect: °· Any bruising will usually fade within 1 to 2 weeks. °· Blood that collects in the tissue (hematoma) may be painful to the touch. It should usually decrease in size and tenderness within 1 to 2 weeks. °SEEK IMMEDIATE MEDICAL CARE IF: °· You have unusual pain at the radial site. °· You have redness, warmth, swelling, or pain at the radial site. °· You have drainage (other than a small amount of blood on the dressing). °· You have chills. °· You have a fever or persistent symptoms for more than 72 hours. °· You have a fever and your symptoms suddenly get worse. °· Your arm becomes pale, cool, tingly, or numb. °· You have heavy bleeding from the site. Hold pressure on the site. °Document Released: 03/09/2010 Document Revised:  04/29/2011 Document Reviewed: 03/09/2010 °ExitCare® Patient Information ©2015 [employer]. This information is not intended to replace advice given to you by your health care provider. Make sure you discuss any questions you have with your health care provider. ° °

## 2014-10-11 MED FILL — Nitroglycerin IV Soln 100 MCG/ML in D5W: INTRA_ARTERIAL | Qty: 10 | Status: AC

## 2014-10-13 NOTE — Telephone Encounter (Signed)
I spoke with Jeffrey Martin at [facility] who advised that Jeffrey Asp has been in jury duty but she received a message from her that patient reported Medicaid application was started in hospital and pt should call DSS to check on the progress of the application.  If DSS has not received the application, patient should notify Jeffrey Asp so that she can send him another packet.  I called and reviewed this information with the patient who verbalized understanding and agreement.  I am sending message to Dr. Antoine Poche and his primary MA since patient has f/u scheduled with him on 10/6.

## 2014-11-15 ENCOUNTER — Other Ambulatory Visit: Payer: Self-pay

## 2014-11-15 DIAGNOSIS — I4891 Unspecified atrial fibrillation: Secondary | ICD-10-CM

## 2014-11-15 DIAGNOSIS — I255 Ischemic cardiomyopathy: Secondary | ICD-10-CM

## 2014-11-15 MED ORDER — METOPROLOL SUCCINATE ER 25 MG PO TB24: 25.0000 mg | ORAL_TABLET | Freq: Every day | ORAL | Status: DC

## 2014-11-15 NOTE — Telephone Encounter (Signed)
Rx refill metoprolol 25 mg Stated has schedule appointment with PCP

## 2014-11-19 DIAGNOSIS — I428 Other cardiomyopathies: Secondary | ICD-10-CM

## 2014-11-19 HISTORY — DX: Other cardiomyopathies: I42.8

## 2014-11-22 ENCOUNTER — Ambulatory Visit: Payer: Medicaid Other | Attending: Family Medicine | Admitting: Family Medicine

## 2014-11-22 ENCOUNTER — Ambulatory Visit (INDEPENDENT_AMBULATORY_CARE_PROVIDER_SITE_OTHER): Payer: Self-pay | Admitting: Neurology

## 2014-11-22 ENCOUNTER — Encounter: Payer: Self-pay | Admitting: Family Medicine

## 2014-11-22 ENCOUNTER — Other Ambulatory Visit: Payer: Self-pay | Admitting: Family Medicine

## 2014-11-22 ENCOUNTER — Encounter: Payer: Self-pay | Admitting: Neurology

## 2014-11-22 VITALS — BP 132/74 | HR 64 | Temp 98.0°F | Resp 18 | Ht 67.0 in | Wt 156.6 lb

## 2014-11-22 VITALS — BP 115/65 | HR 65 | Ht 67.0 in | Wt 157.8 lb

## 2014-11-22 DIAGNOSIS — I5022 Chronic systolic (congestive) heart failure: Secondary | ICD-10-CM | POA: Insufficient documentation

## 2014-11-22 DIAGNOSIS — I63411 Cerebral infarction due to embolism of right middle cerebral artery: Secondary | ICD-10-CM

## 2014-11-22 DIAGNOSIS — Z114 Encounter for screening for human immunodeficiency virus [HIV]: Secondary | ICD-10-CM

## 2014-11-22 DIAGNOSIS — I481 Persistent atrial fibrillation: Secondary | ICD-10-CM | POA: Diagnosis not present

## 2014-11-22 DIAGNOSIS — Z23 Encounter for immunization: Secondary | ICD-10-CM | POA: Insufficient documentation

## 2014-11-22 DIAGNOSIS — I4891 Unspecified atrial fibrillation: Secondary | ICD-10-CM

## 2014-11-22 DIAGNOSIS — Z7902 Long term (current) use of antithrombotics/antiplatelets: Secondary | ICD-10-CM | POA: Diagnosis not present

## 2014-11-22 DIAGNOSIS — Z7901 Long term (current) use of anticoagulants: Secondary | ICD-10-CM

## 2014-11-22 DIAGNOSIS — I255 Ischemic cardiomyopathy: Secondary | ICD-10-CM

## 2014-11-22 DIAGNOSIS — I4819 Other persistent atrial fibrillation: Secondary | ICD-10-CM

## 2014-11-22 DIAGNOSIS — I63231 Cerebral infarction due to unspecified occlusion or stenosis of right carotid arteries: Secondary | ICD-10-CM

## 2014-11-22 DIAGNOSIS — I482 Chronic atrial fibrillation, unspecified: Secondary | ICD-10-CM

## 2014-11-22 DIAGNOSIS — Z79899 Other long term (current) drug therapy: Secondary | ICD-10-CM | POA: Insufficient documentation

## 2014-11-22 DIAGNOSIS — Z1159 Encounter for screening for other viral diseases: Secondary | ICD-10-CM | POA: Diagnosis not present

## 2014-11-22 DIAGNOSIS — Z0000 Encounter for general adult medical examination without abnormal findings: Secondary | ICD-10-CM | POA: Diagnosis not present

## 2014-11-22 DIAGNOSIS — F17210 Nicotine dependence, cigarettes, uncomplicated: Secondary | ICD-10-CM | POA: Insufficient documentation

## 2014-11-22 DIAGNOSIS — I429 Cardiomyopathy, unspecified: Secondary | ICD-10-CM

## 2014-11-22 DIAGNOSIS — I509 Heart failure, unspecified: Secondary | ICD-10-CM | POA: Diagnosis present

## 2014-11-22 DIAGNOSIS — F17200 Nicotine dependence, unspecified, uncomplicated: Secondary | ICD-10-CM

## 2014-11-22 MED ORDER — NICOTINE 7 MG/24HR TD PT24: 7.0000 mg | MEDICATED_PATCH | Freq: Every day | TRANSDERMAL | Status: DC

## 2014-11-22 MED ORDER — NICOTINE 14 MG/24HR TD PT24: 14.0000 mg | MEDICATED_PATCH | Freq: Every day | TRANSDERMAL | Status: DC

## 2014-11-22 MED ORDER — SIMVASTATIN 20 MG PO TABS: 20.0000 mg | ORAL_TABLET | Freq: Every day | ORAL | Status: DC

## 2014-11-22 MED ORDER — METOPROLOL SUCCINATE ER 25 MG PO TB24: 25.0000 mg | ORAL_TABLET | Freq: Every day | ORAL | Status: DC

## 2014-11-22 NOTE — Progress Notes (Signed)
Patient here for A Fib follow-up. Patient denies pain at this time.132 Patient needs refills on metoprolol, simvastatin, eliquis. Patient out of nicotine patch.

## 2014-11-22 NOTE — Progress Notes (Signed)
Subjective:  Patient ID: Jeffrey Martin, male    DOB: 1951/01/24  Age: 64 y.o. MRN: [id number]  CC: No chief complaint on file.   HPI Ayiden Milliman presents for    1. CHF and Afib: followed by cardiology. Recently had a normal cath obtained due to declining EF. Smoking rarely. Out of nicotine patches.  2. HM: due for and amenable to flu shot. Also amenable to screening HIV and hep C.   Social History  Substance Use Topics  . Smoking status: Current Every Day Smoker -- 3.00 packs/day    Types: Cigarettes    Start date: 09/05/2014    Last Attempt to Quit: 09/05/2014  . Smokeless tobacco: Former Neurosurgeon  . Alcohol Use: 0.0 oz/week    0 Standard drinks or equivalent per week     Comment: rare   Outpatient Prescriptions Prior to Visit  Medication Sig Dispense Refill  . apixaban (ELIQUIS) 5 MG TABS tablet Take 1 tablet (5 mg total) by mouth 2 (two) times daily. 60 tablet 1  . furosemide (LASIX) 40 MG tablet Take 1 tablet (40 mg total) by mouth daily. 30 tablet 6  . irbesartan (AVAPRO) 300 MG tablet Take 1 tablet (300 mg total) by mouth daily. 30 tablet 1  . metoprolol succinate (TOPROL-XL) 25 MG 24 hr tablet Take 1 tablet (25 mg total) by mouth daily. 20 tablet 0  . Multiple Vitamins-Minerals (MULTIVITAMIN & MINERAL PO) Take 1 tablet by mouth daily.    . nicotine (NICODERM CQ - DOSED IN MG/24 HOURS) 14 mg/24hr patch Place 1 patch (14 mg total) onto the skin daily. 14 patch 0  . simvastatin (ZOCOR) 20 MG tablet Take 1 tablet (20 mg total) by mouth daily at 6 PM. 30 tablet 1   No facility-administered medications prior to visit.    ROS Review of Systems  Constitutional: Negative for fever, chills, fatigue and unexpected weight change.  Eyes: Negative for visual disturbance.  Respiratory: Positive for shortness of breath (when walking a lot. goes away with rest ). Negative for cough.   Cardiovascular: Negative for chest pain, palpitations and leg swelling.  Gastrointestinal:  Negative for nausea, vomiting, abdominal pain, diarrhea, constipation and blood in stool.  Endocrine: Negative for polydipsia, polyphagia and polyuria.  Musculoskeletal: Negative for myalgias, back pain, arthralgias, gait problem and neck pain.  Skin: Negative for rash.  Allergic/Immunologic: Negative for immunocompromised state.  Neurological: Negative for dizziness and light-headedness.  Hematological: Negative for adenopathy. Does not bruise/bleed easily.  Psychiatric/Behavioral: Negative for suicidal ideas, sleep disturbance and dysphoric mood. The patient is not nervous/anxious.     Objective:  BP 132/74 mmHg  Pulse 64  Temp(Src) 98 F (36.7 C) (Oral)  Resp 18  Ht  (1.702 m)  Wt 156 lb 9.6 oz (71.033 kg)  BMI 24.52 kg/m2  SpO2 98%  BP/Weight 11/22/2014 11/22/2014 10/10/2014  Systolic BP 132 115 148  Diastolic BP 74 65 91  Wt. (Lbs) 156.6 157.8 164  BMI 24.52 24.71 25.68   Physical Exam  Constitutional: He appears well-developed and well-nourished. No distress.  HENT:  Head: Normocephalic and atraumatic.  Neck: Normal range of motion. Neck supple.  Cardiovascular: Normal rate, regular rhythm, normal heart sounds and intact distal pulses.   Pulmonary/Chest: Effort normal and breath sounds normal.  Musculoskeletal: He exhibits no edema.  Neurological: He is alert.  Skin: Skin is warm and dry. No rash noted. No erythema.  Psychiatric: He has a normal mood and affect.  Assessment & Plan:   Problem List Items Addressed This Visit    Atrial fibrillation (HCC) - Primary (Chronic)    Stable, rate controlled  Continue current medication regimen Continue close f/u with cardiology       Chronic systolic heart failure (HCC)    Stable Continue current medication regimen Continue close f/u with cardiology          No orders of the defined types were placed in this encounter.    Follow-up: No Follow-up on file.   Dessa Phi MD

## 2014-11-22 NOTE — Assessment & Plan Note (Signed)
Counseled to quit smoking Refilled nicotine patch

## 2014-11-22 NOTE — Patient Instructions (Addendum)
[name], Jeffrey Martin was seen today for congestive heart failure and atrial fibrillation.  Diagnoses and all orders for this visit:  Persistent atrial fibrillation (HCC) -     HIV antibody (with reflex)  Chronic systolic heart failure (HCC)  Screening for HIV (human immunodeficiency virus)  Need for hepatitis C screening test -     Hepatitis C antibody, reflex  Tobacco use disorder -     nicotine (NICODERM CQ - DOSED IN MG/24 HOURS) 14 mg/24hr patch; Place 1 patch (14 mg total) onto the skin daily. -     nicotine (NICODERM CQ - DOSED IN MG/24 HR) 7 mg/24hr patch; Place 1 patch (7 mg total) onto the skin daily.   F/u in 3 months for CHF and A fib   Dr. Armen Pickup

## 2014-11-22 NOTE — Patient Instructions (Signed)
-   continue eliquis and zocor for stroke prevention - Follow up with your primary care physician for stroke risk factor modification. Recommend maintain blood pressure goal <130/80, diabetes with hemoglobin A1c goal below 6.5% and lipids with LDL cholesterol goal below 70 mg/dL.  - check BP at home - follow up with cardiology as scheduled. - healthy diet and regular exercise - follow up in 4 months.

## 2014-11-22 NOTE — Addendum Note (Signed)
Addended by: Dessa Phi on: 11/22/2014 05:33 PM   Modules accepted: Orders

## 2014-11-22 NOTE — Assessment & Plan Note (Signed)
Stable Continue current medication regimen Continue close f/u with cardiology

## 2014-11-22 NOTE — Assessment & Plan Note (Addendum)
Stable, rate controlled  Continue current medication regimen Continue close f/u with cardiology

## 2014-11-23 LAB — HIV ANTIBODY (ROUTINE TESTING W REFLEX): HIV 1&2 Ab, 4th Generation: NONREACTIVE

## 2014-11-23 LAB — HEPATITIS C ANTIBODY: HCV Ab: NEGATIVE

## 2014-11-24 ENCOUNTER — Ambulatory Visit (INDEPENDENT_AMBULATORY_CARE_PROVIDER_SITE_OTHER): Payer: Medicaid Other | Admitting: Cardiology

## 2014-11-24 ENCOUNTER — Encounter: Payer: Self-pay | Admitting: Cardiology

## 2014-11-24 VITALS — BP 141/99 | HR 54 | Ht 67.0 in | Wt 154.0 lb

## 2014-11-24 DIAGNOSIS — I429 Cardiomyopathy, unspecified: Secondary | ICD-10-CM

## 2014-11-24 DIAGNOSIS — I63411 Cerebral infarction due to embolism of right middle cerebral artery: Secondary | ICD-10-CM | POA: Insufficient documentation

## 2014-11-24 DIAGNOSIS — I4819 Other persistent atrial fibrillation: Secondary | ICD-10-CM

## 2014-11-24 DIAGNOSIS — I63231 Cerebral infarction due to unspecified occlusion or stenosis of right carotid arteries: Secondary | ICD-10-CM | POA: Insufficient documentation

## 2014-11-24 DIAGNOSIS — I481 Persistent atrial fibrillation: Secondary | ICD-10-CM

## 2014-11-24 DIAGNOSIS — Z7901 Long term (current) use of anticoagulants: Secondary | ICD-10-CM | POA: Insufficient documentation

## 2014-11-24 MED ORDER — CARVEDILOL 6.25 MG PO TABS: 6.2500 mg | ORAL_TABLET | Freq: Two times a day (BID) | ORAL | Status: DC

## 2014-11-24 NOTE — Progress Notes (Signed)
STROKE NEUROLOGY FOLLOW UP NOTE  NAME: Jeffrey Martin DOB: [birth year]  REASON FOR VISIT: stroke follow up HISTORY FROM: pt and chart  Today we had the pleasure of seeing Jeffrey Martin in follow-up at our Neurology Clinic. Pt was accompanied by no one.   History Summary Jeffrey Martin is a 64 y.o. male with history of atrial fibrillation on ASA was admitted on 09/06/14 for left sided weakness and left facial droop. MRI showed right BG, right CR, right opercular and right temporal lobe infarcts, consistent with embolic pattern, likely due to afib not on anticoagulation. 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 EF 30-35%, LDL 77 and A1C 6.1. His ASA was changed to eliquis and add zocor. Jeffrey Martin was discharged to SNF.  Interval History During the interval time, the patient has been doing well. Followed up with cardiology for cardiomyopathy with EF 30-35%. Had nuclear stress test showed EF 27%, ans also cardiac cath recently, and showed no CAD. Otherwise, Jeffrey Martin feels good and no complains. Has not completety quit smoking yet.    REVIEW OF SYSTEMS: Full 14 system review of systems performed and notable only for those listed below and in HPI above, all others are negative:  Constitutional:   Cardiovascular:  Ear/Nose/Throat:   Skin:  Eyes:   Respiratory:   Gastroitestinal:   Genitourinary:  Hematology/Lymphatic:   Endocrine:  Musculoskeletal:   Allergy/Immunology:   Neurological:   Psychiatric:  Sleep:   The following represents the patient's updated allergies and side effects list: No Known Allergies  The neurologically relevant items on the patient's problem list were reviewed on today's visit.  Neurologic Examination  A problem focused neurological exam (12 or more points of the single system neurologic examination, vital signs counts as 1 point, cranial nerves count for 8 points) was performed.  Blood pressure 115/65, pulse 65, height  (1.702 m), weight  157 lb 12.8 oz (71.578 kg).  General - Well nourished, well developed, in no apparent distress.  Ophthalmologic - Fundi not visualized due to eye movement.  Cardiovascular - irregularly irregular heart rate and rhythm.  Mental Status -  Level of arousal and orientation to time, place, and person were intact. Language including expression, naming, repetition, comprehension was assessed and found intact. Fund of Knowledge was assessed and was intact.  Cranial Nerves II - XII - II - Visual field intact OU. III, IV, VI - Extraocular movements intact. V - Facial sensation intact bilaterally. VII - Facial movement intact bilaterally. VIII - Hearing & vestibular intact bilaterally. X - Palate elevates symmetrically. XI - Chin turning & shoulder shrug intact bilaterally. XII - Tongue protrusion intact.  Motor Strength - The patient's strength was normal in all extremities and pronator drift was absent.  Bulk was normal and fasciculations were absent.   Motor Tone - Muscle tone was assessed at the neck and appendages and was normal.  Reflexes - The patient's reflexes were 1+ in all extremities and Jeffrey Martin had no pathological reflexes.  Sensory - Light touch, temperature/pinprick, vibration and proprioception, and Romberg testing were assessed and were normal.    Coordination - The patient had normal movements in the hands and feet with no ataxia or dysmetria.  Tremor was absent.  Gait and Station - The patient's transfers, posture, gait, station, and turns were observed as normal.  Data reviewed: I personally reviewed the images and agree with the radiology interpretations.  Dg Chest 2 View 09/07/2014 No active cardiopulmonary disease.  Ct Head Wo Contrast 09/07/2014  No acute intracranial pathology. Age-related atrophy and chronic microvascular ischemic disease.   MRI HEAD  09/07/2014  Exam is motion degraded. Acute nonhemorrhagic infarct right lenticular nucleus with  extension into the posterior limb of the right internal capsule and the posterior aspect of the right corona radiata. Smaller tiny acute infarct anterior right corona radiata, posterior right peri operculum region and right temporal lobe. Tiny acute infarct peripheral aspect of the junction of the right frontal- parietal lobe. No intracranial hemorrhage. Mild to moderate small vessel disease type changes. Plaque-like calcification/ossification left aspect of the falx. 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 Otherwise no evidence of intracranial mass detected on this unenhanced exam. Global atrophy without hydrocephalus. C3-4 bulge/protrusion with cord flattening.   MRA HEAD  09/07/2014  Exam is motion degraded. 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 Collateral flow to the right carotid terminus. No high-grade stenosis of the M1 segment of either middle cerebral artery. Poor delineation of majority of middle cerebral artery branch vessels bilaterally more notable on the right. Ectatic anterior cerebral arteries. A tiny anterior communicating artery aneurysm cannot be excluded as questioned on source images. This could be reassessed on follow-up when patient is better able to cooperate. Left vertebral artery slightly dominant in size. No significant stenosis of the distal vertebral arteries. Mild to moderate narrowing mid aspect of the basilar artery. Nonvisualized anterior inferior cerebral arteries.   CTA head and neck - 1. Occlusive embolus to the supraclinoid right ICA with good downstream flow via this anterior communicating artery. The right cervical ICA is likely patent, with delayed filling seen at the skullbase. 2. Mild atheromatous change, mainly at the right carotid bifurcation.  2D echo - - Left ventricle: The cavity size was normal. Systolic function was moderately to severely reduced. The estimated ejection fractionwas in the range of  30% to 35%. Wall motion was normal; therewere no regional wall motion abnormalities. - Ascending aorta: The ascending aorta was mildly dilated measuring 40 mm. - Mitral valve: There was moderate regurgitation. - Left atrium: The atrium was severely dilated. - Right ventricle: Systolic function was normal. - Right atrium: The atrium was moderately dilated. - Tricuspid valve: There was moderate regurgitation. - Pulmonary arteries: Systolic pressure was mildly increased. PA peak pressure: 39 mm Hg (S). - Inferior vena cava: The vessel was normal in size. - Pericardium, extracardiac: There was no pericardial effusion. Impressions: There is no significant difference when compared to the priorstudy from 08/15/2014.  Cardiac stress test 09/16/14 -   Nuclear stress EF: 27%.  There was no ST segment deviation noted during stress.  This is a high risk study.  The left ventricular ejection fraction is severely decreased (<30%).  Cardiac cath 10/10/14 1. Widely patent coronary arteries without obstructive CAD 2. Normal LVEDP 3. Systemic hypertension  Component     Latest Ref Rng 09/07/2014  Cholesterol     0 - 200 mg/dL 161  Triglycerides     <150 mg/dL 50  HDL Cholesterol     >40 mg/dL 48  Total CHOL/HDL Ratio      2.8  VLDL     0 - 40 mg/dL 10  LDL (calc)     0 - 99 mg/dL 77  Hemoglobin W9U     4.8 - 5.6 % 6.1 (H)  Mean Plasma Glucose      128    Assessment: As you may recall, Jeffrey Martin is a 64 y.o. male with  PMH of atrial fibrillation on ASA was admitted on 09/06/14 for right BG, right CR, right opercular and right temporal lobe infarcts, consistent with embolic pattern, likely due to afib not on anticoagulation. 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 EF 30-35%, LDL 77 and A1C 6.1. His ASA was changed to eliquis and add zocor. Followed up with cardiology, had nuclear stress test showed EF 27%, ans also cardiac cath recently, and showed no CAD. Has not completety quit  smoking yet. On Eliquis and zocor.  Plan:  - continue eliquis and zocor for stroke prevention - Follow up with your primary care physician for stroke risk factor modification. Recommend maintain blood pressure goal <130/80, diabetes with hemoglobin A1c goal below 6.5% and lipids with LDL cholesterol goal below 70 mg/dL.  - check BP at home - follow up with cardiology as scheduled. - healthy diet and regular exercise - RTC in 4 months.  I spent more than 25 minutes of face to face time with the patient. Greater than 50% of time was spent in counseling and coordination of care.    No orders of the defined types were placed in this encounter.    No orders of the defined types were placed in this encounter.    Patient Instructions  - continue eliquis and zocor for stroke prevention - Follow up with your primary care physician for stroke risk factor modification. Recommend maintain blood pressure goal <130/80, diabetes with hemoglobin A1c goal below 6.5% and lipids with LDL cholesterol goal below 70 mg/dL.  - check BP at home - follow up with cardiology as scheduled. - healthy diet and regular exercise - follow up in 4 months.   Marvel Plan, MD PhD [facility] [address], [address] [place], [place] [address] [phone]

## 2014-11-24 NOTE — Patient Instructions (Addendum)
Your physician recommends that you schedule a follow-up appointment in: 1 Month with PA  Your physician has recommended you make the following change in your medication: STOP Simvastatin and Metoprolol and START Carvedilol 6.25 mg twice daily.

## 2014-11-24 NOTE — Progress Notes (Signed)
Cardiology Office Note   Date:  11/24/2014   ID:  Jeffrey Martin, DOB 1950-02-22, MRN [id number]  PCP:  Lora Paula, MD  Cardiologist:   Rollene Rotunda, MD   Chief Complaint  Patient presents with  . Atrial Fibrillation      History of Present Illness: Jeffrey Martin is a 64 y.o. male who presents for evaluation of cardiomyopathy and atrial fibrillation. He's had a previous CVA. He's been treated with anticoagulation.  His EF is 30-35%. His course is complicated by alcohol and tobacco use. When he was seen in the office most recently he was short of breath and his oxygen saturations were in the high 80s.  He had a cardiac catheterization demonstrating no coronary disease.  Since he was last seen he is done well. He has dysarthria from his stroke but otherwise gets along well. He does get short of breath walking about a half mile. He's not having any resting shortness of breath. He's not having any PND or orthopnea. He's not having any palpitations, presyncope or syncope. He has no chest discomfort.  Past Medical History  Diagnosis Date  . Peptic ulcer 1988  . Hypertension Dx June 2016  . Hyperlipidemia Dx July 2016  . History of cardiovascular stress test     Lexiscan Myoview 7/16:  EF 27%, possible inferoapical ischemia; High Risk  . Atrial fibrillation (HCC) 07/2014  . Stroke [facility])     Past Surgical History  Procedure Laterality Date  . Repair of perforated ulcer    . Cardiac catheterization N/A 10/10/2014    Procedure: Right/Left Heart Cath and Coronary Angiography;  Surgeon: Tonny Bollman, MD;  Location: [facility] INVASIVE CV LAB;  Service: Cardiovascular;  Laterality: N/A;     Current Outpatient Prescriptions  Medication Sig Dispense Refill  . furosemide (LASIX) 40 MG tablet Take 1 tablet (40 mg total) by mouth daily. 30 tablet 6  . irbesartan (AVAPRO) 300 MG tablet Take 1 tablet (300 mg total) by mouth daily. 30 tablet 1  . Multiple Vitamins-Minerals  (MULTIVITAMIN & MINERAL PO) Take 1 tablet by mouth daily.    Marland Kitchen apixaban (ELIQUIS) 5 MG TABS tablet Take 1 tablet (5 mg total) by mouth 2 (two) times daily. (Patient not taking: Reported on 11/24/2014) 60 tablet 1  . metoprolol succinate (TOPROL-XL) 25 MG 24 hr tablet Take 1 tablet (25 mg total) by mouth daily. (Patient not taking: Reported on 11/24/2014) 60 tablet 5  . simvastatin (ZOCOR) 20 MG tablet Take 1 tablet (20 mg total) by mouth daily at 6 PM. (Patient not taking: Reported on 11/24/2014) 30 tablet 5   No current facility-administered medications for this visit.    Allergies:   Review of patient's allergies indicates no known allergies.    ROS:  Please see the history of present illness.   Otherwise, review of systems are positive for none.   All other systems are reviewed and negative.    PHYSICAL EXAM: VS:  BP 141/99 mmHg  Pulse 54  Ht 5\' 7"  (1.702 m)  Wt 154 lb (69.854 kg)  BMI 24.11 kg/m2 , BMI Body mass index is 24.11 kg/(m^2). GENERAL:  Well appearing HEENT:  Pupils equal round and reactive, fundi not visualized, oral mucosa unremarkable NECK:  No jugular venous distention, waveform within normal limits, carotid upstroke brisk and symmetric, no bruits, no thyromegaly LYMPHATICS:  No cervical, inguinal adenopathy LUNGS:  Clear to auscultation bilaterally BACK:  No CVA tenderness CHEST:  Unremarkable HEART:  PMI not displaced  or sustained,S1 and S2 within normal limits, no S3, no clicks, no rubs, no murmurs, fixed split S2 ABD:  Flat, positive bowel sounds normal in frequency in pitch, no bruits, no rebound, no guarding, no midline pulsatile mass, no hepatomegaly, no splenomegaly EXT:  2 plus pulses throughout, no edema, no cyanosis no clubbing   EKG:  EKG is not ordered today.   Recent Labs: 09/07/2014: ALT 17 10/07/2014: BUN 17; Creatinine, Ser 1.25; Hemoglobin 15.7; Platelets 180.0; Potassium 3.9; Sodium 143    Lipid Panel    Component Value Date/Time   CHOL 135  09/07/2014 0525   TRIG 50 09/07/2014 0525   HDL 48 09/07/2014 0525   CHOLHDL 2.8 09/07/2014 0525   VLDL 10 09/07/2014 0525   LDLCALC 77 09/07/2014 0525      Wt Readings from Last 3 Encounters:  11/24/14 154 lb (69.854 kg)  11/22/14 156 lb 9.6 oz (71.033 kg)  11/22/14 157 lb 12.8 oz (71.578 kg)      Other studies Reviewed: Additional studies/ records that were reviewed today include: Hospital records including cath.. Review of the above records demonstrates:  Please see elsewhere in the note.     ASSESSMENT AND PLAN:  CARDIOMYOPATHY:  He has a dilated cardiomyopathy. Unfortunately until his disability kicks and he says he's had trouble getting his medicines. He'll look into a cheaper alternative to the Toprol XL and maybe change to carvedilol. We can get samples of his anticoagulation. He apparently has his ARB. I'll have him come back in one month for close follow-up to see if he is back on his meds and to see if we can titrate further.  CVA:  This is being managed in the context of treating his atrial fibrillation.  ATRIAL FIB:  I will get him back on his Eliquis with samples. We can check a CBC when he returns next month.  MEDS:   Zocor can be stopped  ETOH:     Current medicines are reviewed at length with the patient today.  The patient does not have concerns regarding medicines.  The following changes have been made:  See above  Labs/ tests ordered today include: Cath  No orders of the defined types were placed in this encounter.     Disposition:   FU with APP in one month.    Signed, Rollene Rotunda, MD  11/24/2014 10:31 AM    [facility]

## 2014-11-25 ENCOUNTER — Telehealth: Payer: Self-pay | Admitting: Family Medicine

## 2014-11-25 NOTE — Telephone Encounter (Signed)
-----   Message from Dessa Phi, MD sent at 11/23/2014  9:06 AM EDT ----- Screening HIV and Hep C negative

## 2014-11-25 NOTE — Telephone Encounter (Signed)
LVM to return call.

## 2014-11-25 NOTE — Telephone Encounter (Signed)
Patient called stating he received a call from [place]. Please follow up with patient. Thank you.

## 2014-12-27 ENCOUNTER — Encounter: Payer: Self-pay | Admitting: Physician Assistant

## 2014-12-27 ENCOUNTER — Ambulatory Visit (INDEPENDENT_AMBULATORY_CARE_PROVIDER_SITE_OTHER): Payer: Medicaid Other | Admitting: Physician Assistant

## 2014-12-27 VITALS — BP 80/70 | HR 52 | Ht 67.0 in | Wt 154.3 lb

## 2014-12-27 DIAGNOSIS — I952 Hypotension due to drugs: Secondary | ICD-10-CM | POA: Diagnosis not present

## 2014-12-27 DIAGNOSIS — I5022 Chronic systolic (congestive) heart failure: Secondary | ICD-10-CM | POA: Insufficient documentation

## 2014-12-27 MED ORDER — CARVEDILOL 3.125 MG PO TABS: 3.1250 mg | ORAL_TABLET | Freq: Two times a day (BID) | ORAL | Status: DC

## 2014-12-27 MED ORDER — FUROSEMIDE 20 MG PO TABS: 20.0000 mg | ORAL_TABLET | Freq: Every day | ORAL | Status: DC

## 2014-12-27 MED ORDER — APIXABAN 5 MG PO TABS: 5.0000 mg | ORAL_TABLET | Freq: Two times a day (BID) | ORAL | Status: DC

## 2014-12-27 NOTE — Progress Notes (Signed)
Cardiology Office Note   Date:  12/27/2014   ID:  Jeffrey Martin, DOB 1950/09/04, MRN [id number]  PCP:  Jeffrey Paula, MD  Cardiologist:  Dr. Luna Kitchens, PA-C   Chief Complaint  Patient presents with  . Follow-up    1 mo per Jeffrey Martin//samples of Eliquis    History of Present Illness: Jeffrey Martin is a 64 y.o. male with a history of PAF, CVA, on Eliquis and CM, no CAD at cath; EF 30-35 percent by echo  Jeffrey Martin presents for  1 month followup.  Since discharge from the hospital, he is doing very well. He will not be able to afford the Eliquis, but if he can get assistance with that, he doesn't mind taking it. He has been able to get the carvedilol, furosemide and Avapro and is taking them as prescribed. His brother is helping him get his medications.  He has no lower extremity edema. He denies orthopnea or PND. He occasionally gets lightheaded with position changes, but this is brief and goes away quickly. He has had no chest pain. He does not have a way to check his blood pressure at home. He also does not have scales and knows that he will have trouble affording them. He never has palpitations and denies any awareness of an irregular heartbeat.   Past Medical History  Diagnosis Date  . Peptic ulcer 1988  . Hypertension Dx June 2016  . Hyperlipidemia Dx July 2016  . History of cardiovascular stress test     Lexiscan Myoview 7/16:  EF 27%, possible inferoapical ischemia; High Risk; no CAD at cath  . Atrial fibrillation (HCC) 07/2014  . Stroke (HCC)   . NICM (nonischemic cardiomyopathy) (HCC) 11/2014    EF 30-35% by echo, 27% by MV, no CAD at cath    Past Surgical History  Procedure Laterality Date  . Repair of perforated ulcer    . Cardiac catheterization N/A 10/10/2014    Procedure: Right/Left Heart Cath and Coronary Angiography;  Surgeon: Jeffrey Bollman, MD; normal coronary arteries, normal LVEDP, systemic hypertension     Current  Outpatient Prescriptions  Medication Sig Dispense Refill  . apixaban (ELIQUIS) 5 MG TABS tablet Take 1 tablet (5 mg total) by mouth 2 (two) times daily. (Patient not taking: Reported on 11/24/2014) 60 tablet 1  . carvedilol (COREG) 6.25 MG tablet Take 1 tablet (6.25 mg total) by mouth 2 (two) times daily. 60 tablet 6  . furosemide (LASIX) 40 MG tablet Take 1 tablet (40 mg total) by mouth daily. 30 tablet 6  . irbesartan (AVAPRO) 300 MG tablet Take 1 tablet (300 mg total) by mouth daily. 30 tablet 1  . Multiple Vitamins-Minerals (MULTIVITAMIN & MINERAL PO) Take 1 tablet by mouth daily.     No current facility-administered medications for this visit.    Allergies:   Review of patient's allergies indicates no known allergies.    Social History:  The patient  reports that he has been smoking Cigarettes.  He started smoking about 3 months ago. He has been smoking about 3.00 packs per day. He has quit using smokeless tobacco. He reports that he drinks alcohol. He reports that he does not use illicit drugs.   Family History:  The patient's family history includes Hypertension in his mother; Stroke in his maternal grandfather and mother. There is no history of Heart attack.    ROS:  Please see the history of present illness. All other systems are reviewed and  negative.    PHYSICAL EXAM: VS:  BP 80/70 mmHg  Pulse 52  Ht 5\' 7"  (1.702 m)  Wt 154 lb 4.8 oz (69.99 kg)  BMI 24.16 kg/m2 , BMI Body mass index is 24.16 kg/(m^2). GEN: Well nourished, well developed, male in no acute distress HEENT: normal for age  Neck: no JVD, no carotid bruit, no masses; no hepatojugular reflux Cardiac: Slightly irregular, skipping beats; no murmur, no rubs, or gallops Respiratory:  clear to auscultation bilaterally, normal work of breathing GI: soft, nontender, nondistended, + BS MS: no deformity or atrophy; no edema; distal pulses are 2+ in all 4 extremities  Skin: warm and dry, no rash Neuro:  Strength and  sensation are intact Psych: euthymic mood, full affect   EKG:  EKG is not ordered today.  Recent Labs: 09/07/2014: ALT 17 10/07/2014: BUN 17; Creatinine, Ser 1.25; Hemoglobin 15.7; Platelets 180.0; Potassium 3.9; Sodium 143    Lipid Panel    Component Value Date/Time   CHOL 135 09/07/2014 0525   TRIG 50 09/07/2014 0525   HDL 48 09/07/2014 0525   CHOLHDL 2.8 09/07/2014 0525   VLDL 10 09/07/2014 0525   LDLCALC 77 09/07/2014 0525     Wt Readings from Last 3 Encounters:  12/27/14 154 lb 4.8 oz (69.99 kg)  11/24/14 154 lb (69.854 kg)  11/22/14 156 lb 9.6 oz (71.033 kg)     Other studies Reviewed: Additional studies/ records that were reviewed today include: Hospital records, echo and cath report.  ASSESSMENT AND PLAN:  1.  Chronic systolic CHF: His volume status is good. He is trying to eat a low sodium diet and is compliant with his medications. Continue to follow symptoms, be aware of edema,  2. Hypotension: His blood pressure is low today but he is relatively asymptomatic. Because he is also bradycardic, we will decrease the carvedilol to 3.125 mg twice a day. We will also decrease his Lasix to 40 mg daily.  He will come in in one to 2 weeks for a blood pressure and weight check. We will get a BMET at that time and make sure he is tolerating his medications well.  Current medicines are reviewed at length with the patient today.  The patient does not have concerns regarding medicines.  The following changes have been made:  Changes are listed above  Labs/ tests ordered today include: No orders of the defined types were placed in this encounter.    Disposition:   FU with Dr. Antoine Martin  Signed, Jeffrey Martin  12/27/2014 1:18 PM    [facility] [address] [place], [place], [place]  [address] Phone: [phone]; Fax: [phone]

## 2014-12-27 NOTE — Patient Instructions (Addendum)
Your physician has recommended you make the following change in your medication...  1. DECREASE carvedilol to 3.125mg  twice daily 2. DECREASE lasix to 20mg  daily  >> updated prescriptions for each medication have been sent to your pharmacy   Please schedule a BLOOD PRESSURE CHECK with Belenda Cruise in 1-2 weeks  Your physician recommends that you schedule a follow-up appointment Dr. Antoine Poche FIRST AVAILABLE   Please complete ELIQUIS patient assistance paperwork

## 2015-01-03 ENCOUNTER — Other Ambulatory Visit: Payer: Self-pay | Admitting: Family Medicine

## 2015-01-05 ENCOUNTER — Other Ambulatory Visit: Payer: Self-pay | Admitting: Family Medicine

## 2015-01-05 DIAGNOSIS — E785 Hyperlipidemia, unspecified: Secondary | ICD-10-CM

## 2015-01-05 MED ORDER — SIMVASTATIN 20 MG PO TABS: 20.0000 mg | ORAL_TABLET | Freq: Every day | ORAL | Status: DC

## 2015-01-19 ENCOUNTER — Ambulatory Visit (INDEPENDENT_AMBULATORY_CARE_PROVIDER_SITE_OTHER): Payer: Medicaid Other | Admitting: Pharmacist Clinician (PhC)/ Clinical Pharmacy Specialist

## 2015-01-19 VITALS — BP 124/90 | HR 52 | Ht 67.0 in | Wt 156.0 lb

## 2015-01-19 DIAGNOSIS — I10 Essential (primary) hypertension: Secondary | ICD-10-CM

## 2015-01-19 MED ORDER — APIXABAN 5 MG PO TABS: 5.0000 mg | ORAL_TABLET | Freq: Two times a day (BID) | ORAL | Status: DC

## 2015-01-19 NOTE — Patient Instructions (Signed)
  Your blood pressure today is 131/93  (goal is < 140/90)  Take your BP meds as follows: continue with all your current medications  Bring all of your meds, your BP cuff and your record of home blood pressures to your next appointment.  Exercise as you're able, try to walk approximately 30 minutes per day.  Keep salt intake to a minimum, especially watch canned and prepared boxed foods.  Eat more fresh fruits and vegetables and fewer canned items.  Avoid eating in fast food restaurants.

## 2015-01-19 NOTE — Assessment & Plan Note (Signed)
Pt BP improved today from last visit, since decreasing carvedilol and furosemide.  His pressure was difficult to hear with stethoscope, so verified with electronic office cuff.  Will leave his medications alone, he is having no concerns.   I asked him to check his BP at [facility] or [facility] from time to time and let us know if it becomes elevated.

## 2015-01-19 NOTE — Progress Notes (Signed)
     01/19/2015 Jeffrey Martin [birth year] [id number]   HPI:  Jeffrey Martin is a 64 y.o. male patient of Dr Antoine Poche, with a PMH below who presents today for hypertension clinic evaluation.  He tells me that his wife has just recently passed away and he has been having a difficult week.   He saw Jeffrey Martin on Nov 8 and had a BP of 80/70 with a HR of 52.  He otherwise states doing well with his medications and reports no side effects or other concerns.    Cardiac Hx: PAF, CVA (June 2016), EF 30-35% by echo  Family Hx: mother had hypertension and stroke  Social Hx: smokes 1/2 ppd; had decreased to just an occasional cigarette, is working to decrease again; drinks some beer; 1-2 caffeinated sodas per day  Diet: still adds salt to most foods at table  Exercise: none  Home BP readings: no home cuff  Current antihypertensive medications: carvedilol 3.125 mg bid, irbesartan 300 mg qam  Wt Readings from Last 3 Encounters:  01/19/15 156 lb (70.761 kg)  12/27/14 154 lb 4.8 oz (69.99 kg)  11/24/14 154 lb (69.854 kg)   BP Readings from Last 3 Encounters:  01/19/15 124/90  12/27/14 80/70  11/24/14 141/99   Pulse Readings from Last 3 Encounters:  01/19/15 52  12/27/14 52  11/24/14 54    Current Outpatient Prescriptions  Medication Sig Dispense Refill  . apixaban (ELIQUIS) 5 MG TABS tablet Take 1 tablet (5 mg total) by mouth 2 (two) times daily. 180 tablet 3  . carvedilol (COREG) 3.125 MG tablet Take 1 tablet (3.125 mg total) by mouth 2 (two) times daily. 60 tablet 5  . furosemide (LASIX) 20 MG tablet Take 1 tablet (20 mg total) by mouth daily. 30 tablet 5  . irbesartan (AVAPRO) 300 MG tablet TAKE 1 TABLET BY MOUTH DAILY. 30 tablet 1  . Multiple Vitamins-Minerals (MULTIVITAMIN & MINERAL PO) Take 1 tablet by mouth daily.    . simvastatin (ZOCOR) 20 MG tablet Take 1 tablet (20 mg total) by mouth daily. 30 tablet 5   No current facility-administered medications for this visit.     No Known Allergies  Past Medical History  Diagnosis Date  . Peptic ulcer 1988  . Hypertension Dx June 2016  . Hyperlipidemia Dx July 2016  . History of cardiovascular stress test     Lexiscan Myoview 7/16:  EF 27%, possible inferoapical ischemia; High Risk; no CAD at cath  . Atrial fibrillation (HCC) 07/2014  . Stroke (HCC)   . NICM (nonischemic cardiomyopathy) (HCC) 11/2014    EF 30-35% by echo, 27% by MV, no CAD at cath    Blood pressure 124/90, pulse 52, height 5\' 7"  (1.702 m), weight 156 lb (70.761 kg).    Phillips Hay PharmD CPP [facility]

## 2015-01-20 ENCOUNTER — Encounter: Payer: Self-pay | Admitting: Cardiology

## 2015-01-20 ENCOUNTER — Ambulatory Visit (INDEPENDENT_AMBULATORY_CARE_PROVIDER_SITE_OTHER): Payer: Medicaid Other | Admitting: Cardiology

## 2015-01-20 ENCOUNTER — Telehealth: Payer: Self-pay | Admitting: *Deleted

## 2015-01-20 VITALS — BP 116/88 | HR 68 | Ht 67.0 in | Wt 156.1 lb

## 2015-01-20 DIAGNOSIS — Z79899 Other long term (current) drug therapy: Secondary | ICD-10-CM

## 2015-01-20 LAB — CBC
HCT: 42.1 % (ref 39.0–52.0)
HEMOGLOBIN: 13.7 g/dL (ref 13.0–17.0)
MCH: 29.5 pg (ref 26.0–34.0)
MCHC: 32.5 g/dL (ref 30.0–36.0)
MCV: 90.7 fL (ref 78.0–100.0)
MPV: 10.8 fL (ref 8.6–12.4)
Platelets: 183 10*3/uL (ref 150–400)
RBC: 4.64 MIL/uL (ref 4.22–5.81)
RDW: 15.4 % (ref 11.5–15.5)
WBC: 4.3 10*3/uL (ref 4.0–10.5)

## 2015-01-20 MED ORDER — APIXABAN 5 MG PO TABS: 5.0000 mg | ORAL_TABLET | Freq: Two times a day (BID) | ORAL | Status: DC

## 2015-01-20 MED ORDER — CARVEDILOL 6.25 MG PO TABS: 6.2500 mg | ORAL_TABLET | Freq: Two times a day (BID) | ORAL | Status: DC

## 2015-01-20 NOTE — Progress Notes (Signed)
Cardiology Office Note   Date:  01/20/2015   ID:  Jeffrey Martin, DOB 08-Sep-1950, MRN [id number]  PCP:  Lora Paula, MD  Cardiologist:   Rollene Rotunda, MD   No chief complaint on file.     History of Present Illness: Jeffrey Martin is a 64 y.o. male who presents for evaluation of cardiomyopathy and atrial fibrillation. He's had a previous CVA. He's been treated with anticoagulation.  His EF is 30-35%. His course is complicated by alcohol and tobacco use.   At the last visit he was slightly hypotensive and bradycardic. His beta blocker was slightly reduced in his Lasix dose reduced.  However, today we realized that he has also been taking Toprol.  He does have some SOB but no distress.  The patient denies any new symptoms such as chest discomfort, neck or arm discomfort. There has been no new shortness of breath, PND or orthopnea. There have been no reported palpitations, presyncope or syncope.  Unfortunately his wife of 35 years died suddenly on Thanksgiving day.  Past Medical History  Diagnosis Date  . Peptic ulcer 1988  . Hypertension Dx June 2016  . Hyperlipidemia Dx July 2016  . History of cardiovascular stress test     Lexiscan Myoview 7/16:  EF 27%, possible inferoapical ischemia; High Risk; no CAD at cath  . Atrial fibrillation (HCC) 07/2014  . Stroke (HCC)   . NICM (nonischemic cardiomyopathy) (HCC) 11/2014    EF 30-35% by echo, 27% by MV, no CAD at cath    Past Surgical History  Procedure Laterality Date  . Repair of perforated ulcer    . Cardiac catheterization N/A 10/10/2014    Procedure: Right/Left Heart Cath and Coronary Angiography;  Surgeon: Tonny Bollman, MD; normal coronary arteries, normal LVEDP, systemic hypertension      Current Outpatient Prescriptions  Medication Sig Dispense Refill  . apixaban (ELIQUIS) 5 MG TABS tablet Take 1 tablet (5 mg total) by mouth 2 (two) times daily. 56 tablet 0  . carvedilol (COREG) 3.125 MG tablet Take 1 tablet  (3.125 mg total) by mouth 2 (two) times daily. 60 tablet 5  . furosemide (LASIX) 20 MG tablet Take 1 tablet (20 mg total) by mouth daily. 30 tablet 5  . irbesartan (AVAPRO) 300 MG tablet TAKE 1 TABLET BY MOUTH DAILY. 30 tablet 1  . metoprolol succinate (TOPROL-XL) 25 MG 24 hr tablet Take 25 mg by mouth daily.    . Multiple Vitamins-Minerals (MULTIVITAMIN & MINERAL PO) Take 1 tablet by mouth daily.    . simvastatin (ZOCOR) 20 MG tablet Take 1 tablet (20 mg total) by mouth daily. 30 tablet 5   No current facility-administered medications for this visit.    Allergies:   Review of patient's allergies indicates no known allergies.    ROS:  Please see the history of present illness.   Otherwise, review of systems are positive for none.   All other systems are reviewed and negative.    PHYSICAL EXAM: VS:  BP 116/88 mmHg  Pulse 68  Ht 5\' 7"  (1.702 m)  Wt 156 lb 2 oz (70.818 kg)  BMI 24.45 kg/m2 , BMI Body mass index is 24.45 kg/(m^2). GENERAL:  Well appearing NECK:  No jugular venous distention, waveform within normal limits, carotid upstroke brisk and symmetric, no bruits, no thyromegaly LUNGS:  Clear to auscultation bilaterally BACK:  No CVA tenderness CHEST:  Unremarkable HEART:  PMI not displaced or sustained,S1 and S2 within normal limits, no S3, no  clicks, no rubs, no murmurs, fixed split S2 ABD:  Flat, positive bowel sounds normal in frequency in pitch, no bruits, no rebound, no guarding, no midline pulsatile mass, no hepatomegaly, no splenomegaly EXT:  2 plus pulses throughout, no edema, no cyanosis no clubbing   EKG:  EKG is not ordered today.   Recent Labs: 09/07/2014: ALT 17 10/07/2014: BUN 17; Creatinine, Ser 1.25; Hemoglobin 15.7; Platelets 180.0; Potassium 3.9; Sodium 143    Lipid Panel    Component Value Date/Time   CHOL 135 09/07/2014 0525   TRIG 50 09/07/2014 0525   HDL 48 09/07/2014 0525   CHOLHDL 2.8 09/07/2014 0525   VLDL 10 09/07/2014 0525   LDLCALC 77  09/07/2014 0525      Wt Readings from Last 3 Encounters:  01/20/15 156 lb 2 oz (70.818 kg)  01/19/15 156 lb (70.761 kg)  12/27/14 154 lb 4.8 oz (69.99 kg)      Other studies Reviewed: Additional studies/ records that were reviewed today include: Hospital records including cath.. Review of the above records demonstrates:  Please see elsewhere in the note.     ASSESSMENT AND PLAN:  CARDIOMYOPATHY:  He has a dilated cardiomyopathy. I believe he is currently taking all of his medications. However, he was taking both Toprol and Coreg. I'm going to increase the Coreg back to 6.25 twice daily and stop the Toprol.    CVA:  This is being managed in the context of treating his atrial fibrillation.  ATRIAL FIB:  I will get him back on his Eliquis with samples. he needs a CBC.   ETOH:  He is drinking a beer rarely  TOBACCO:  He was educated about this. I will continue to work on this.   Current medicines are reviewed at length with the patient today.  The patient does not have concerns regarding medicines.  The following changes have been made:  See above  Labs/ tests ordered today include: Cath  No orders of the defined types were placed in this encounter.     Disposition:   FU with APP in one month.    Signed, Rollene Rotunda, MD  01/20/2015 9:40 AM    [facility]

## 2015-01-20 NOTE — Telephone Encounter (Signed)
Approval for Eliquis from [employer] patient assistance foundation received 01/19/2015 -01/19/2016.

## 2015-01-20 NOTE — Patient Instructions (Signed)
Your physician recommends that you schedule a follow-up appointment in: 1 Month with Jeffrey Martin  Your physician has recommended you make the following change in your medication: Increase Carvedilol 6.25 mg twice a day  Your physician recommends that you return for lab work in: CBC

## 2015-02-21 ENCOUNTER — Ambulatory Visit (INDEPENDENT_AMBULATORY_CARE_PROVIDER_SITE_OTHER): Payer: Self-pay | Admitting: Physician Assistant

## 2015-02-21 ENCOUNTER — Encounter: Payer: Self-pay | Admitting: Physician Assistant

## 2015-02-21 VITALS — BP 112/90 | HR 75 | Ht 67.0 in | Wt 156.9 lb

## 2015-02-21 DIAGNOSIS — E785 Hyperlipidemia, unspecified: Secondary | ICD-10-CM

## 2015-02-21 DIAGNOSIS — Z79899 Other long term (current) drug therapy: Secondary | ICD-10-CM

## 2015-02-21 DIAGNOSIS — I4891 Unspecified atrial fibrillation: Secondary | ICD-10-CM

## 2015-02-21 DIAGNOSIS — I429 Cardiomyopathy, unspecified: Secondary | ICD-10-CM

## 2015-02-21 DIAGNOSIS — I420 Dilated cardiomyopathy: Secondary | ICD-10-CM

## 2015-02-21 NOTE — Patient Instructions (Signed)
Continue current medications  No more than one (1) beer a day  Your physician recommends that you return for lab work in: TODAY AT [facility]  Your physician discussed the hazards of tobacco use. Tobacco use cessation is recommended and techniques and options to help you quit were discussed.   Your physician recommends that you schedule a follow-up appointment in: 6 MONTHS WITH DR. HOCHREIN

## 2015-02-21 NOTE — Progress Notes (Signed)
Cardiology Office Note   Date:  02/21/2015   ID:  Hemingway Mcrill, DOB 1950-02-21, MRN [id number]  PCP:  Lora Paula, MD  Cardiologist:  Dr. Luna Kitchens, PA-C   Chief Complaint  Patient presents with  . Follow-up    No complaints of chest pain, SOB, edema or dizziness.    History of Present Illness: Shanden Paar is a 65 y.o. male with a history of CVA, AFib on Eliquis, NICM w/ EF 30-35%, ETOH & Tobacco use.  Seen by [facility] 12/02, Eliquis assistance obtained and BB decreased  Arsene Deshazier presents for  Follow-up from that visit.   Mr. Shehee has been doing very well. He does not have scales and does not monitor his weight but does not believe it has changed recently and in fact it has not. He is compliant with his medications. He has not been lightheaded or dizzy. No one is following his cholesterol and it has not been checked recently. He has not had any bleeding issues. He is trying to be compliant with a low-sodium diet. He is down to just a few cigarettes  and 1 beer daily. He is not having any palpitations and has no awareness of the atrial fibrillation.   He feels he is handling the death of his wife pretty well. It was hard at first, but he is doing better and apparently has good support from family and friends.   Past Medical History  Diagnosis Date  . Peptic ulcer 1988  . Hypertension Dx June 2016  . Hyperlipidemia Dx July 2016  . History of cardiovascular stress test     Lexiscan Myoview 7/16:  EF 27%, possible inferoapical ischemia; High Risk; no CAD at cath  . Atrial fibrillation (HCC) 07/2014  . Stroke (HCC)   . NICM (nonischemic cardiomyopathy) (HCC) 11/2014    EF 30-35% by echo, 27% by MV, no CAD at cath    Past Surgical History  Procedure Laterality Date  . Repair of perforated ulcer    . Cardiac catheterization N/A 10/10/2014    Procedure: Right/Left Heart Cath and Coronary Angiography;  Surgeon: Tonny Bollman, MD; normal  coronary arteries, normal LVEDP, systemic hypertension     Current Outpatient Prescriptions  Medication Sig Dispense Refill  . apixaban (ELIQUIS) 5 MG TABS tablet Take 1 tablet (5 mg total) by mouth 2 (two) times daily. 60 tablet 11  . carvedilol (COREG) 6.25 MG tablet Take 1 tablet (6.25 mg total) by mouth 2 (two) times daily. 60 tablet 6  . furosemide (LASIX) 20 MG tablet Take 1 tablet (20 mg total) by mouth daily. 30 tablet 5  . irbesartan (AVAPRO) 300 MG tablet TAKE 1 TABLET BY MOUTH DAILY. 30 tablet 1  . Multiple Vitamins-Minerals (MULTIVITAMIN & MINERAL PO) Take 1 tablet by mouth daily.    . simvastatin (ZOCOR) 20 MG tablet Take 1 tablet (20 mg total) by mouth daily. 30 tablet 5   No current facility-administered medications for this visit.    Allergies:   Review of patient's allergies indicates no known allergies.    Social History:  The patient  reports that he has been smoking Cigarettes.  He started smoking about 5 months ago. He has been smoking about 3.00 packs per day. He has quit using smokeless tobacco. He reports that he drinks alcohol. He reports that he does not use illicit drugs.   Family History:  The patient's family history includes Hypertension in his mother; Stroke in his maternal grandfather  and mother. There is no history of Heart attack.    ROS:  Please see the history of present illness. All other systems are reviewed and negative.    PHYSICAL EXAM: VS:  BP 112/90 mmHg  Pulse 75  Ht  (1.702 m)  Wt 156 lb 14.4 oz (71.169 kg)  BMI 24.57 kg/m2 , BMI Body mass index is 24.57 kg/(m^2). GEN: Well nourished, well developed, male in no acute distress HEENT: normal for age  Neck: no JVD, no carotid bruit, no masses Cardiac:  Irregularly irregular; no murmur, no rubs, or gallops Respiratory:  clear to auscultation bilaterally, normal work of breathing GI: soft, nontender, nondistended, + BS MS: no deformity or atrophy; no edema; distal pulses are 2+ in all  4 extremities  Skin: warm and dry, no rash Neuro:  Strength and sensation are intact Psych: euthymic mood, full affect   EKG:  EKG is ordered today. The ekg ordered today demonstrates  Atrial fibrillation, controlled ventricular rate at 75. No new ischemic changes, occasional PVCs , no change   Recent Labs: 09/07/2014: ALT 17 10/07/2014: BUN 17; Creatinine, Ser 1.25; Potassium 3.9; Sodium 143 01/20/2015: Hemoglobin 13.7; Platelets 183    Lipid Panel    Component Value Date/Time   CHOL 135 09/07/2014 0525   TRIG 50 09/07/2014 0525   HDL 48 09/07/2014 0525   CHOLHDL 2.8 09/07/2014 0525   VLDL 10 09/07/2014 0525   LDLCALC 77 09/07/2014 0525     Wt Readings from Last 3 Encounters:  02/21/15 156 lb 14.4 oz (71.169 kg)  01/20/15 156 lb 2 oz (70.818 kg)  01/19/15 156 lb (70.761 kg)     Other studies Reviewed: Additional studies/ records that were reviewed today include:  Previous office notes, cath report and ECGs.  ASSESSMENT AND PLAN:  1.   Atrial fibrillation: his heart rate is controlled on a moderate dose of carvedilol. We will continue this. He is asymptomatic.  2. Chronic anticoagulation , Eliquis: CHADS2VASC= 3 He has on Eliquis and qualified for the company's assistance program. He is not having any bleeding issues and is compliant.   3. EtOH and tobacco use: he was counseled on smoking cessation and alcohol cessation. He assures me he will not drink more than one beer at a time and is trying to quit smoking and alcohol completely. However, because of the social stresses involved the death of his wife it is not likely to happen right now.   4. Chronic systolic CHF with nonischemic cardiomyopathy: his volume status is at baseline. I contacted Daphne with the heart failure team at the hospital and she will get him a set of scales. He promises me that he will use them if he has them.  Current medicines are reviewed at length with the patient today.  The patient does not  have concerns regarding medicines.  The following changes have been made:  no change  Labs/ tests ordered today include:   Orders Placed This Encounter  Procedures  . Lipid panel  . Comprehensive metabolic panel  . EKG 12-Lead     Disposition:   FU with  Dr. Antoine Poche in 6 months  Signed, Leanna Battles  02/21/2015 4:00 PM    [facility] [address] [place], [place], [place]  [address] Phone: [phone]; Fax: [phone]

## 2015-02-23 ENCOUNTER — Telehealth (HOSPITAL_COMMUNITY): Payer: Self-pay | Admitting: Surgery

## 2015-02-23 NOTE — Telephone Encounter (Signed)
I received a referral from Lavella Hammock- PA in reference to patient and the fact that he was in need of a scale for home use.  I called patient today to inform him that I had mailed a scale and he tells me that he has already received the scale through the mail.  I reviewed the importance of daily weights and when to contact the physician in relation to weight gains.  I also encouraged him to call me back with any concerns or questions related to his HF.

## 2015-03-01 MED FILL — ?SIMVASTATIN 20 MG TABLET: 20 MG | 30 days supply | Qty: 30 | Fill #2

## 2015-03-01 MED FILL — IRBESARTAN 300 MG TABLET: 300 | 30 days supply | Qty: 30 | Fill #2

## 2015-03-03 MED FILL — CARVEDILOL 6.25 MG TABLET: 6.25 | 30 days supply | Qty: 60 | Fill #2

## 2015-03-14 MED FILL — ?FUROSEMIDE 40 MG TABLET: 40 | 30 days supply | Qty: 30 | Fill #3

## 2015-03-27 ENCOUNTER — Ambulatory Visit (INDEPENDENT_AMBULATORY_CARE_PROVIDER_SITE_OTHER): Payer: Self-pay | Admitting: Neurology

## 2015-03-27 ENCOUNTER — Encounter: Payer: Self-pay | Admitting: Neurology

## 2015-03-27 VITALS — BP 122/90 | HR 73 | Ht 67.0 in | Wt 161.0 lb

## 2015-03-27 DIAGNOSIS — I482 Chronic atrial fibrillation, unspecified: Secondary | ICD-10-CM

## 2015-03-27 DIAGNOSIS — I63231 Cerebral infarction due to unspecified occlusion or stenosis of right carotid arteries: Secondary | ICD-10-CM

## 2015-03-27 DIAGNOSIS — Z7901 Long term (current) use of anticoagulants: Secondary | ICD-10-CM

## 2015-03-27 DIAGNOSIS — I429 Cardiomyopathy, unspecified: Secondary | ICD-10-CM

## 2015-03-27 DIAGNOSIS — I255 Ischemic cardiomyopathy: Secondary | ICD-10-CM

## 2015-03-27 DIAGNOSIS — I63411 Cerebral infarction due to embolism of right middle cerebral artery: Secondary | ICD-10-CM

## 2015-03-27 NOTE — Patient Instructions (Addendum)
-   continue eliquis and zocor for stroke prevention - will repeat echocardiogram and carotid doppler - Follow up with your primary care physician for stroke risk factor modification. Recommend maintain blood pressure goal 120-150, diabetes with hemoglobin A1c goal below 6.5% and lipids with LDL cholesterol goal below 70 mg/dL.  - check BP at home, BP goal 120-150 due to right carotid occlusion - follow up with cardiology as scheduled. - healthy diet and regular exercise - quit smoking - follow up in 6 months.

## 2015-03-27 NOTE — Progress Notes (Signed)
STROKE NEUROLOGY FOLLOW UP NOTE  NAME: Jeffrey Martin DOB: 03-Dec-1950  REASON FOR VISIT: stroke follow up HISTORY FROM: pt and chart  Today we had the pleasure of seeing Hawken Bielby in follow-up at our Neurology Clinic. Pt was accompanied by no one.   History Summary Mr. Saquan Furtick is a 65 y.o. male with history of atrial fibrillation on ASA was admitted on 09/06/14 for left sided weakness and left facial droop. MRI showed right BG, right CR, right opercular and right temporal lobe infarcts, consistent with embolic pattern, likely due to afib not on anticoagulation. 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 EF 30-35%, LDL 77 and A1C 6.1. His ASA was changed to eliquis and add zocor. He was discharged to SNF.  11/22/14 follow up - the patient has been doing well. Followed up with cardiology for cardiomyopathy with EF 30-35%. Had nuclear stress test showed EF 27%, ans also cardiac cath recently, and showed no CAD. Otherwise, he feels good and no complains. Has not completety quit smoking yet.  Wife died in 02-17-15  Interval History During the interval time, pt has been doing well from stroke standpoint. He has been followed with cardiology for afib and CHF. Currently on coreg for cardiomyopathy. He is also on lasix and avapro. Continued with eliquis without side effects. He stated compliance with eliquis. BP 122/90  REVIEW OF SYSTEMS: Full 14 system review of systems performed and notable only for those listed below and in HPI above, all others are negative:  Constitutional:   Cardiovascular:  Ear/Nose/Throat:   Skin:  Eyes:   Respiratory:  SOB Gastroitestinal:   Genitourinary:  Hematology/Lymphatic:   Endocrine:  Musculoskeletal:   Allergy/Immunology:   Neurological:   Psychiatric:  Sleep:   The following represents the patient's updated allergies and side effects list: No Known Allergies  The neurologically relevant items on the patient's problem list  were reviewed on today's visit.  Neurologic Examination  A problem focused neurological exam (12 or more points of the single system neurologic examination, vital signs counts as 1 point, cranial nerves count for 8 points) was performed.  Blood pressure 122/90, pulse 73, height  (1.702 m), weight 161 lb (73.029 kg).  General - Well nourished, well developed, in no apparent distress.  Ophthalmologic - Fundi not visualized due to eye movement.  Cardiovascular - irregularly irregular heart rate and rhythm.  Mental Status -  Level of arousal and orientation to time, place, and person were intact. Language including expression, naming, repetition, comprehension was assessed and found intact. Fund of Knowledge was assessed and was intact.  Cranial Nerves II - XII - II - Visual field intact OU. III, IV, VI - Extraocular movements intact. V - Facial sensation intact bilaterally. VII - Facial movement intact bilaterally. VIII - Hearing & vestibular intact bilaterally. X - Palate elevates symmetrically. XI - Chin turning & shoulder shrug intact bilaterally. XII - Tongue protrusion intact.  Motor Strength - The patient's strength was normal in all extremities and pronator drift was absent.  Bulk was normal and fasciculations were absent.   Motor Tone - Muscle tone was assessed at the neck and appendages and was normal.  Reflexes - The patient's reflexes were 1+ in all extremities and he had no pathological reflexes.  Sensory - Light touch, temperature/pinprick, vibration and proprioception, and Romberg testing were assessed and were normal.    Coordination - The patient had normal movements in the hands and feet with no ataxia  or dysmetria.  Tremor was absent.  Gait and Station - The patient's transfers, posture, gait, station, and turns were observed as normal.  Data reviewed: I personally reviewed the images and agree with the radiology interpretations.  Dg Chest 2  View 09/07/2014 No active cardiopulmonary disease.   Ct Head Wo Contrast 09/07/2014  No acute intracranial pathology. Age-related atrophy and chronic microvascular ischemic disease.   MRI HEAD  09/07/2014  Exam is motion degraded. Acute nonhemorrhagic infarct right lenticular nucleus with extension into the posterior limb of the right internal capsule and the posterior aspect of the right corona radiata. Smaller tiny acute infarct anterior right corona radiata, posterior right peri operculum region and right temporal lobe. Tiny acute infarct peripheral aspect of the junction of the right frontal- parietal lobe. No intracranial hemorrhage. Mild to moderate small vessel disease type changes. Plaque-like calcification/ossification left aspect of the falx. 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 Otherwise no evidence of intracranial mass detected on this unenhanced exam. Global atrophy without hydrocephalus. C3-4 bulge/protrusion with cord flattening.   MRA HEAD  09/07/2014  Exam is motion degraded. 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 Collateral flow to the right carotid terminus. No high-grade stenosis of the M1 segment of either middle cerebral artery. Poor delineation of majority of middle cerebral artery branch vessels bilaterally more notable on the right. Ectatic anterior cerebral arteries. A tiny anterior communicating artery aneurysm cannot be excluded as questioned on source images. This could be reassessed on follow-up when patient is better able to cooperate. Left vertebral artery slightly dominant in size. No significant stenosis of the distal vertebral arteries. Mild to moderate narrowing mid aspect of the basilar artery. Nonvisualized anterior inferior cerebral arteries.   CTA head and neck - 1. Occlusive embolus to the supraclinoid right ICA with good downstream flow via this anterior communicating artery. The right  cervical ICA is likely patent, with delayed filling seen at the skullbase. 2. Mild atheromatous change, mainly at the right carotid bifurcation.  2D echo - - Left ventricle: The cavity size was normal. Systolic function was moderately to severely reduced. The estimated ejection fractionwas in the range of 30% to 35%. Wall motion was normal; therewere no regional wall motion abnormalities. - Ascending aorta: The ascending aorta was mildly dilated measuring 40 mm. - Mitral valve: There was moderate regurgitation. - Left atrium: The atrium was severely dilated. - Right ventricle: Systolic function was normal. - Right atrium: The atrium was moderately dilated. - Tricuspid valve: There was moderate regurgitation. - Pulmonary arteries: Systolic pressure was mildly increased. PA peak pressure: 39 mm Hg (S). - Inferior vena cava: The vessel was normal in size. - Pericardium, extracardiac: There was no pericardial effusion. Impressions: There is no significant difference when compared to the priorstudy from 08/15/2014.  Cardiac stress test 09/16/14 -   Nuclear stress EF: 27%.  There was no ST segment deviation noted during stress.  This is a high risk study.  The left ventricular ejection fraction is severely decreased (<30%).  Cardiac cath 10/10/14 1. Widely patent coronary arteries without obstructive CAD 2. Normal LVEDP 3. Systemic hypertension  Component     Latest Ref Rng 09/07/2014  Cholesterol     0 - 200 mg/dL 563  Triglycerides     <150 mg/dL 50  HDL Cholesterol     >40 mg/dL 48  Total CHOL/HDL Ratio      2.8  VLDL     0 - 40 mg/dL 10  LDL (calc)     0 - 99 mg/dL 77  Hemoglobin W0J     4.8 - 5.6 % 6.1 (H)  Mean Plasma Glucose      128    Assessment: As you may recall, he is a 65 y.o. male with PMH of atrial fibrillation on ASA was admitted on 09/06/14 for right BG, right CR, right opercular and right temporal lobe infarcts, consistent with embolic pattern, likely due  to afib not on anticoagulation. 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 EF 30-35%, LDL 77 and A1C 6.1. His ASA was changed to eliquis and add zocor. Followed up with cardiology, had nuclear stress test showed EF 27%, ans also cardiac cath recently, and showed no CAD. Has not completety quit smoking yet. On Eliquis and zocor. Followed with cardiology regularly. No stroke like symptoms during interval time. Will repeat CUS and 2D echo.   Plan:  - continue eliquis and zocor for stroke prevention - will repeat echocardiogram and carotid doppler - Follow up with your primary care physician for stroke risk factor modification. Recommend maintain blood pressure goal 120-150, diabetes with hemoglobin A1c goal below 6.5% and lipids with LDL cholesterol goal below 70 mg/dL.  - check BP at home, BP goal 120-150 due to right carotid occlusion - follow up with cardiology as scheduled. - healthy diet and regular exercise - quit smoking - follow up in 6 months.  I spent more than 25 minutes of face to face time with the patient. Greater than 50% of time was spent in counseling and coordination of care. We reviewed his images and cardiology recs, discussed about repeat CUS to evaluate right ICA occlusion and repeat TTE.    Orders Placed This Encounter  Procedures  . ECHOCARDIOGRAM COMPLETE    Standing Status: Future     Number of Occurrences:      Standing Expiration Date: 06/25/2016    Order Specific Question:  Where should this test be performed    Answer:  [facility])    Order Specific Question:  Complete or Limited study?    Answer:  Complete    Order Specific Question:  With Image Enhancing Agent or without Image Enhancing Agent?    Answer:  With Image Enhancing Agent    Order Specific Question:  Reason for exam-Echo    Answer:  Stroke  434.91 / I163.9    No orders of the defined types were placed in this encounter.    Patient Instructions  - continue  eliquis and zocor for stroke prevention - will repeat echocardiogram and carotid doppler - Follow up with your primary care physician for stroke risk factor modification. Recommend maintain blood pressure goal 120-150, diabetes with hemoglobin A1c goal below 6.5% and lipids with LDL cholesterol goal below 70 mg/dL.  - check BP at home, BP goal 120-150 due to right carotid occlusion - follow up with cardiology as scheduled. - healthy diet and regular exercise - quit smoking - follow up in 6 months.    Marvel Plan, MD PhD [facility] [address] [place], [place] [address] [phone]

## 2015-03-31 ENCOUNTER — Other Ambulatory Visit: Payer: Self-pay | Admitting: Family Medicine

## 2015-03-31 MED FILL — ?SIMVASTATIN 20 MG TABLET: 20 MG | 30 days supply | Qty: 30 | Fill #3

## 2015-03-31 MED FILL — IRBESARTAN 300 MG TABLET: 300 | 30 days supply | Qty: 30 | Fill #0

## 2015-04-03 MED FILL — CARVEDILOL 6.25 MG TABLET: 6.25 | 30 days supply | Qty: 60 | Fill #3

## 2015-04-26 ENCOUNTER — Ambulatory Visit (HOSPITAL_COMMUNITY)
Admission: RE | Admit: 2015-04-26 | Discharge: 2015-04-26 | Disposition: A | Discharge status: Home / Self Care | Payer: Medicaid Other | Source: Ambulatory Visit | Attending: Neurology | Admitting: Neurology

## 2015-04-26 DIAGNOSIS — I63411 Cerebral infarction due to embolism of right middle cerebral artery: Secondary | ICD-10-CM | POA: Diagnosis not present

## 2015-04-26 DIAGNOSIS — I6523 Occlusion and stenosis of bilateral carotid arteries: Secondary | ICD-10-CM | POA: Insufficient documentation

## 2015-04-26 DIAGNOSIS — I6521 Occlusion and stenosis of right carotid artery: Secondary | ICD-10-CM | POA: Diagnosis present

## 2015-04-26 NOTE — Progress Notes (Signed)
*  PRELIMINARY RESULTS* Vascular Ultrasound Carotid Duplex (Doppler) has been completed.  Preliminary findings: Bilateral: No significant (1-39%) ICA stenosis. Antegrade vertebral flow.  Known intracranial right ICA occlusion. No evidence of stenosis or occlusion extracranially.     Farrel Demark, RDMS, RVT  04/26/2015, 9:14 AM

## 2015-05-01 MED FILL — SIMVASTATIN 20 MG TABLET: 20 | 30 days supply | Qty: 30 | Fill #4

## 2015-05-01 MED FILL — IRBESARTAN 300 MG TABLET: 300 | 30 days supply | Qty: 30 | Fill #1

## 2015-05-01 MED FILL — FUROSEMIDE 40 MG TABLET: 40 | 30 days supply | Qty: 30 | Fill #4

## 2015-05-02 MED FILL — CARVEDILOL 6.25 MG TABLET: 6.25 | 30 days supply | Qty: 60 | Fill #4

## 2015-05-10 ENCOUNTER — Ambulatory Visit (HOSPITAL_COMMUNITY): Payer: Medicaid Other | Attending: Cardiology

## 2015-05-10 ENCOUNTER — Other Ambulatory Visit: Payer: Self-pay

## 2015-05-10 DIAGNOSIS — I340 Nonrheumatic mitral (valve) insufficiency: Secondary | ICD-10-CM | POA: Insufficient documentation

## 2015-05-10 DIAGNOSIS — Z720 Tobacco use: Secondary | ICD-10-CM | POA: Diagnosis not present

## 2015-05-10 DIAGNOSIS — I509 Heart failure, unspecified: Secondary | ICD-10-CM | POA: Diagnosis not present

## 2015-05-10 DIAGNOSIS — I110 Hypertensive heart disease with heart failure: Secondary | ICD-10-CM | POA: Diagnosis not present

## 2015-05-10 DIAGNOSIS — I639 Cerebral infarction, unspecified: Secondary | ICD-10-CM | POA: Diagnosis present

## 2015-05-10 DIAGNOSIS — I4891 Unspecified atrial fibrillation: Secondary | ICD-10-CM | POA: Diagnosis not present

## 2015-05-10 DIAGNOSIS — I255 Ischemic cardiomyopathy: Secondary | ICD-10-CM | POA: Insufficient documentation

## 2015-05-10 DIAGNOSIS — E785 Hyperlipidemia, unspecified: Secondary | ICD-10-CM | POA: Insufficient documentation

## 2015-05-10 DIAGNOSIS — I071 Rheumatic tricuspid insufficiency: Secondary | ICD-10-CM | POA: Diagnosis not present

## 2015-05-11 ENCOUNTER — Telehealth: Payer: Self-pay | Admitting: *Deleted

## 2015-05-11 NOTE — Telephone Encounter (Signed)
Spoke with patient and informed him, per Dr Roda Shutters that  the echocardiogram test done recently showed no change from 08/2014, heart function stable since then. Please continue current treatment. Patient verbalized understanding, appreciation.

## 2015-05-31 ENCOUNTER — Telehealth: Payer: Self-pay | Admitting: *Deleted

## 2015-05-31 MED FILL — ?SIMVASTATIN 20 MG TABLET: 20 MG | 30 days supply | Qty: 30 | Fill #5

## 2015-05-31 MED FILL — IRBESARTAN 300 MG TABLET: 300 | 30 days supply | Qty: 30 | Fill #2

## 2015-05-31 NOTE — Telephone Encounter (Signed)
Medication samples have been provided to the patient.  Drug name: Eliquis  Qty: 14  LOT: [id number]  Exp.Date: 5/18  Patient at office now and samples given directly to pt. Pt said he ran out yesterday and his mail order will not be here until later this week. Eliquis 2.5mg  samples given. Educated pt to take 2 tablets (5mg ) by mouth twice a day of the samples but to return to original order of 1 tablet (5mg ) by mouth daily when he receives his Rx in the mail. Pt verbalized understanding.  Catalina Gravel 1:52 PM 05/31/2015

## 2015-06-09 MED FILL — CARVEDILOL 6.25 MG TABLET: 6.25 | 30 days supply | Qty: 60 | Fill #5

## 2015-07-03 ENCOUNTER — Other Ambulatory Visit: Payer: Self-pay | Admitting: Family Medicine

## 2015-07-03 MED FILL — ?FUROSEMIDE 40 MG TABLET: 40 | 30 days supply | Qty: 30 | Fill #5

## 2015-07-03 MED FILL — IRBESARTAN 300 MG TABLET: 300 | 30 days supply | Qty: 30 | Fill #3

## 2015-07-04 MED FILL — ?SIMVASTATIN 20 MG TABLET: 20 MG | 30 days supply | Qty: 30 | Fill #0

## 2015-07-18 MED FILL — ?CARVEDILOL 6.25 MG TABLET: 6.25 | 30 days supply | Qty: 60 | Fill #0

## 2015-08-03 ENCOUNTER — Other Ambulatory Visit: Payer: Self-pay | Admitting: Family Medicine

## 2015-08-17 MED FILL — ?SIMVASTATIN 20 MG TABLET: 20 MG | 30 days supply | Qty: 30 | Fill #1

## 2015-08-17 MED FILL — IRBESARTAN 300 MG TABLET: 300 | 30 days supply | Qty: 30 | Fill #0

## 2015-08-30 MED FILL — CARVEDILOL 6.25 MG TABLET: 6.25 | 30 days supply | Qty: 60 | Fill #1

## 2015-09-10 NOTE — Progress Notes (Signed)
Cardiology Office Note   Date:  09/11/2015   ID:  Jeffrey Martin, DOB May 05, 1950, MRN [id number]  PCP:  Jeffrey Paula, MD  Cardiologist:   Jeffrey Rotunda, MD   No chief complaint on file.     History of Present Illness: Jeffrey Martin is a 65 y.o. male who presents for evaluation of cardiomyopathy and atrial fibrillation. He's had a previous CVA. He's been treated with anticoagulation.  His EF is 30-35%. His course is complicated by alcohol and tobacco use.  He has had hypotension and  bradycardic. In the past his beta blocker was slightly reduced in his Lasix dose reduced.  He has been followed in the HF clinic.  He was last seen by Theodore Demark PA in Jan.  It was arranged for him to get a scale.  Complicating his treatment is the fact that his wife of 35 years died suddenly last Thanksgiving.  He returns today and says he is doing better. He says he is compliant with his medicine but at the same time he says he is taking half of a pill and we don't know what that would be.  He says he's drinking alcohol rarely and almost completely off cigarettes. He is living by himself and moved to a different apartment.  The patient denies any new symptoms such as chest discomfort, neck or arm discomfort. There has been no new shortness of breath, PND or orthopnea. There have been no reported palpitations, presyncope or syncope.   Past Medical History:  Diagnosis Date  . Atrial fibrillation (HCC) 07/2014  . History of cardiovascular stress test    Lexiscan Myoview 7/16:  EF 27%, possible inferoapical ischemia; High Risk; no CAD at cath  . Hyperlipidemia Dx July 2016  . Hypertension Dx June 2016  . NICM (nonischemic cardiomyopathy) (HCC) 11/2014   EF 30-35% by echo, 27% by MV, no CAD at cath  . Peptic ulcer 1988  . Stroke [facility])     Past Surgical History:  Procedure Laterality Date  . CARDIAC CATHETERIZATION N/A 10/10/2014   Procedure: Right/Left Heart Cath and Coronary  Angiography;  Surgeon: Tonny Bollman, MD; normal coronary arteries, normal LVEDP, systemic hypertension   . REPAIR OF PERFORATED ULCER       Current Outpatient Prescriptions  Medication Sig Dispense Refill  . apixaban (ELIQUIS) 5 MG TABS tablet Take 1 tablet (5 mg total) by mouth 2 (two) times daily. 60 tablet 11  . carvedilol (COREG) 6.25 MG tablet Take 1 tablet (6.25 mg total) by mouth 2 (two) times daily. 60 tablet 6  . furosemide (LASIX) 20 MG tablet Take 1 tablet (20 mg total) by mouth daily. 30 tablet 5  . irbesartan (AVAPRO) 300 MG tablet Take 1 tablet (300 mg total) by mouth daily. Needs office visit for refills 30 tablet 0  . Multiple Vitamins-Minerals (MULTIVITAMIN & MINERAL PO) Take 1 tablet by mouth daily.    . simvastatin (ZOCOR) 20 MG tablet TAKE 1 TABLET BY MOUTH DAILY. 30 tablet 2   No current facility-administered medications for this visit.     Allergies:   Review of patient's allergies indicates no known allergies.    ROS:  Please see the history of present illness.   Otherwise, review of systems are positive for none.   All other systems are reviewed and negative.    PHYSICAL EXAM: VS:  BP (!) 141/93 (BP Location: Right Arm, Patient Position: Sitting, Cuff Size: Normal)   Pulse (!) 119   Ht 5'  7" (1.702 m)   Wt 141 lb 12.8 oz (64.3 kg)   SpO2 98%   BMI 22.21 kg/m  , BMI Body mass index is 22.21 kg/m. GENERAL:  Well appearing NECK:  No jugular venous distention, waveform within normal limits, carotid upstroke brisk and symmetric, no bruits, no thyromegaly LUNGS:  Clear to auscultation bilaterally BACK:  No CVA tenderness CHEST:  Unremarkable HEART:  PMI not displaced or sustained,S1 and S2 within normal limits, no S3, no clicks, no rubs, no murmurs, fixed split S2, irregular ABD:  Flat, positive bowel sounds normal in frequency in pitch, no bruits, no rebound, no guarding, no midline pulsatile mass, no hepatomegaly, no splenomegaly EXT:  2 plus pulses  throughout, no edema, no cyanosis no clubbing   EKG:  EKG  is ordered today. Atrial fibrillation, rate 119, axis within normal limits, intervals within normal limits, premature ventricular contractions, no acute ST-T wave changes   Recent Labs: 10/07/2014: BUN 17; Creatinine, Ser 1.25; Potassium 3.9; Sodium 143 01/20/2015: Hemoglobin 13.7; Platelets 183    Lipid Panel    Component Value Date/Time   CHOL 135 09/07/2014 0525   TRIG 50 09/07/2014 0525   HDL 48 09/07/2014 0525   CHOLHDL 2.8 09/07/2014 0525   VLDL 10 09/07/2014 0525   LDLCALC 77 09/07/2014 0525      Wt Readings from Last 3 Encounters:  09/11/15 141 lb 12.8 oz (64.3 kg)  03/27/15 161 lb (73 kg)  02/21/15 156 lb 14.4 oz (71.2 kg)      Other studies Reviewed: Additional studies/ records that were reviewed today include: None Review of the above records demonstrates:    ASSESSMENT AND PLAN:  CARDIOMYOPATHY:  We'll going to have him call and clarify his medications. In anticipation that he is taking them correctly I'm going to increase his carvedilol 12.5 mg twice daily.  I will check a basic metabolic profile.  HTN:  This is being managed in the context of treating his CHF  CVA:  This is being managed in the context of treating his atrial fibrillation.  ATRIAL FIB:  He says he's taking his anticoagulant. I'll check a CBC today.  ETOH:  He is drinking a beer rarely  TOBACCO:  He was educated about this. I will continue to work on this.   Current medicines are reviewed at length with the patient today.  The patient does not have concerns regarding medicines.  The following changes have been made:  See above  Labs/ tests ordered today include: Cath  No orders of the defined types were placed in this encounter.    Disposition:   FU with me in one month.    Signed, Jeffrey Rotunda, MD  09/11/2015 8:32 AM    [facility]

## 2015-09-11 ENCOUNTER — Ambulatory Visit (INDEPENDENT_AMBULATORY_CARE_PROVIDER_SITE_OTHER): Payer: Medicaid Other | Admitting: Cardiology

## 2015-09-11 ENCOUNTER — Other Ambulatory Visit: Payer: Self-pay | Admitting: Family Medicine

## 2015-09-11 ENCOUNTER — Encounter: Payer: Self-pay | Admitting: Cardiology

## 2015-09-11 ENCOUNTER — Telehealth: Payer: Self-pay | Admitting: Cardiology

## 2015-09-11 VITALS — BP 141/93 | HR 119 | Ht 67.0 in | Wt 141.8 lb

## 2015-09-11 DIAGNOSIS — I482 Chronic atrial fibrillation, unspecified: Secondary | ICD-10-CM

## 2015-09-11 DIAGNOSIS — Z79899 Other long term (current) drug therapy: Secondary | ICD-10-CM

## 2015-09-11 DIAGNOSIS — I429 Cardiomyopathy, unspecified: Secondary | ICD-10-CM

## 2015-09-11 LAB — CBC
HEMATOCRIT: 50.9 % — AB (ref 38.5–50.0)
HEMOGLOBIN: 16.9 g/dL (ref 13.2–17.1)
MCH: 30 pg (ref 27.0–33.0)
MCHC: 33.2 g/dL (ref 32.0–36.0)
MCV: 90.2 fL (ref 80.0–100.0)
MPV: 11.2 fL (ref 7.5–12.5)
Platelets: 185 10*3/uL (ref 140–400)
RBC: 5.64 MIL/uL (ref 4.20–5.80)
RDW: 15.5 % — ABNORMAL HIGH (ref 11.0–15.0)
WBC: 5 10*3/uL (ref 3.8–10.8)

## 2015-09-11 LAB — BASIC METABOLIC PANEL
BUN: 10 mg/dL (ref 7–25)
CALCIUM: 9.5 mg/dL (ref 8.6–10.3)
CO2: 26 mmol/L (ref 20–31)
Chloride: 107 mmol/L (ref 98–110)
Creat: 1.31 mg/dL — ABNORMAL HIGH (ref 0.70–1.25)
GLUCOSE: 106 mg/dL — AB (ref 65–99)
Potassium: 4.8 mmol/L (ref 3.5–5.3)
Sodium: 142 mmol/L (ref 135–146)

## 2015-09-11 MED ORDER — CARVEDILOL 12.5 MG PO TABS: 12.5000 mg | ORAL_TABLET | Freq: Two times a day (BID) | ORAL | 11 refills | Status: DC

## 2015-09-11 MED FILL — FUROSEMIDE 40 MG TABLET: 40 | 30 days supply | Qty: 30 | Fill #6

## 2015-09-11 MED FILL — ?SIMVASTATIN 20 MG TABLET: 20 MG | 30 days supply | Qty: 30 | Fill #2

## 2015-09-11 MED FILL — ?CARVEDILOL 12.5 MG TABLET: 12.5 | 30 days supply | Qty: 60 | Fill #0

## 2015-09-11 NOTE — Telephone Encounter (Signed)
Mr. Ciliberti is calling about the medication in which he is taking a half of tablet ( Furosemide 40mg  ) and he is supposed to be taking 20mg  and was told to half a tablet .   Please call

## 2015-09-11 NOTE — Patient Instructions (Addendum)
Medication Instructions:  INCREASE Carvedilol 12.5 mg twice a day  Labwork: CBC and BMP  Testing/Procedures: NONE  Follow-Up: Your physician recommends that you schedule a follow-up appointment in: 1 Month   Any Other Special Instructions Will Be Listed Below (If Applicable).   If you need a refill on your cardiac medications before your next appointment, please call your pharmacy.

## 2015-09-11 NOTE — Telephone Encounter (Signed)
Spoke with pt about his medication changes on his Carvedilol

## 2015-09-11 NOTE — Telephone Encounter (Signed)
Rx Request 

## 2015-09-12 MED FILL — IRBESARTAN 300 MG TABLET: 300 | 30 days supply | Qty: 30 | Fill #0

## 2015-09-21 ENCOUNTER — Encounter: Payer: Self-pay | Admitting: Nurse Practitioner

## 2015-09-21 ENCOUNTER — Ambulatory Visit (INDEPENDENT_AMBULATORY_CARE_PROVIDER_SITE_OTHER): Payer: Medicaid Other | Admitting: Nurse Practitioner

## 2015-09-21 VITALS — BP 124/92 | HR 88 | Ht 67.0 in | Wt 141.8 lb

## 2015-09-21 DIAGNOSIS — I482 Chronic atrial fibrillation, unspecified: Secondary | ICD-10-CM

## 2015-09-21 DIAGNOSIS — I63411 Cerebral infarction due to embolism of right middle cerebral artery: Secondary | ICD-10-CM

## 2015-09-21 DIAGNOSIS — I10 Essential (primary) hypertension: Secondary | ICD-10-CM

## 2015-09-21 DIAGNOSIS — F17200 Nicotine dependence, unspecified, uncomplicated: Secondary | ICD-10-CM

## 2015-09-21 DIAGNOSIS — E785 Hyperlipidemia, unspecified: Secondary | ICD-10-CM

## 2015-09-21 NOTE — Patient Instructions (Addendum)
Stressed the importance of management of risk factors to prevent further stroke Continue eliquis and Zocor for secondary stroke prevention and atrial fibrillation Maintain strict control of hypertension with blood pressure goal below 130/90, today's reading 124/92  Cholesterol with LDL cholesterol less than 70,  continue statin drug  Exercise by walking, slowly increase , eat healthy diet with whole grains,  fresh fruits and vegetables Follow-up in 6 months Stop smoking

## 2015-09-21 NOTE — Progress Notes (Signed)
[facility]  PATIENT: Kayhan Boardley DOB: 06/06/1950   REASON FOR VISIT: Follow-up for cerebral infarction due to embolism of right middle cerebral artery, HISTORY FROM: Patient    HISTORY OF PRESENT ILLNESS:History Summary Mr. Laura Radilla is a 65 y.o. male with history of atrial fibrillation on ASA was admitted on 09/06/14 for left sided weakness and left facial droop. MRI showed right BG, right CR, right opercular and right temporal lobe infarcts, consistent with embolic pattern, likely due to afib not on anticoagulation. 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 EF 30-35%, LDL 77 and A1C 6.1. His ASA was changed to eliquis and add zocor. He was discharged to SNF. 11/22/14 follow up Dr Roda Shutters- the patient has been doing well. Followed up with cardiology for cardiomyopathy with EF 30-35%. Had nuclear stress test showed EF 27%, ans also cardiac cath recently, and showed no CAD. Otherwise, he feels good and no complains. Has not completety quit smoking yet.  Wife died in February 16, 2015  Interval History2/6/17 Dr Roda Shutters During the interval time, pt has been doing well from stroke standpoint. He has been followed with cardiology for afib and CHF. Currently on coreg for cardiomyopathy. He is also on lasix and avapro. Continued with eliquis without side effects. He stated compliance with eliquis. BP 122/90 UPDATE 08/03/2017CM Mr. Hildebrandt, 65 year old male returns for follow-up.He had hospital admission 09/06/14 for right BG, right CR, right opercular and right temporal lobe infarcts, consistent with embolic pattern, likely due to afib not on anticoagulation. 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 He is currently on eliquis and Zocor for secondary stroke prevention and atrial fibrillation. He has not had further stroke or TIA symptoms. He is at home independent in all activities of daily living. He has regular follow-up with cardiology for cardiomyopathy. Recent  echocardiogram shows heart function is stable. Carotid ultrasound after last visit was stable. He continues to smoke. He occasionally has alcohol. He returns for reevaluation REVIEW OF SYSTEMS: Full 14 system review of systems performed and notable only for those listed, all others are neg:  Constitutional: neg  Cardiovascular: neg Ear/Nose/Throat: neg  Skin: neg Eyes: neg Respiratory: neg Gastroitestinal: neg  Hematology/Lymphatic: neg  Endocrine: neg Musculoskeletal:neg Allergy/Immunology: neg Neurological: neg Psychiatric: neg Sleep : neg   ALLERGIES: No Known Allergies  HOME MEDICATIONS: Outpatient Medications Prior to Visit  Medication Sig Dispense Refill  . apixaban (ELIQUIS) 5 MG TABS tablet Take 1 tablet (5 mg total) by mouth 2 (two) times daily. 60 tablet 11  . carvedilol (COREG) 12.5 MG tablet Take 1 tablet (12.5 mg total) by mouth 2 (two) times daily. 60 tablet 11  . furosemide (LASIX) 20 MG tablet Take 1 tablet (20 mg total) by mouth daily. 30 tablet 5  . irbesartan (AVAPRO) 300 MG tablet Take 1 tablet (300 mg total) by mouth daily. 30 tablet 0  . Multiple Vitamins-Minerals (MULTIVITAMIN & MINERAL PO) Take 1 tablet by mouth daily.    . simvastatin (ZOCOR) 20 MG tablet TAKE 1 TABLET BY MOUTH DAILY. 30 tablet 2   No facility-administered medications prior to visit.     PAST MEDICAL HISTORY: Past Medical History:  Diagnosis Date  . Atrial fibrillation (HCC) 07/2014  . History of cardiovascular stress test    Lexiscan Myoview 7/16:  EF 27%, possible inferoapical ischemia; High Risk; no CAD at cath  . Hyperlipidemia Dx July 2016  . Hypertension Dx June 2016  . NICM (nonischemic cardiomyopathy) (HCC) 11/2014  EF 30-35% by echo, 27% by MV, no CAD at cath  . Peptic ulcer 1988  . Stroke [facility])     PAST SURGICAL HISTORY: Past Surgical History:  Procedure Laterality Date  . CARDIAC CATHETERIZATION N/A 10/10/2014   Procedure: Right/Left Heart Cath and Coronary  Angiography;  Surgeon: Tonny Bollman, MD; normal coronary arteries, normal LVEDP, systemic hypertension   . REPAIR OF PERFORATED ULCER      FAMILY HISTORY: Family History  Problem Relation Age of Onset  . Heart attack Neg Hx   . Hypertension Mother   . Stroke Mother   . Stroke Maternal Grandfather     SOCIAL HISTORY: Social History   Social History  . Marital status: Widowed    Spouse name: N/A  . Number of children: N/A  . Years of education: N/A   Occupational History  . Not on file.   Social History Main Topics  . Smoking status: Current Some Day Smoker    Packs/day: 3.00    Types: Cigarettes    Start date: 09/05/2014    Last attempt to quit: 09/05/2014  . Smokeless tobacco: Former Neurosurgeon  . Alcohol use 0.0 oz/week     Comment: rare  . Drug use: No  . Sexual activity: Not on file   Other Topics Concern  . Not on file   Social History Narrative   Patient lives with his wife   His son or daughter-in-law will accompany him to appointments      PHYSICAL EXAM  Vitals:   09/21/15 1627  BP: (!) 124/92  Pulse: 88  Weight: 141 lb 12.8 oz (64.3 kg)  Height: 5\' 7"  (1.702 m)   Body mass index is 22.21 kg/m.  Generalized: Well developed, in no acute distress  Head: normocephalic and atraumatic,. Oropharynx benign  Neck: Supple, no carotid bruits  Cardiac: Irregularly irregular rhythm, no murmur  Musculoskeletal: No deformity   Neurological examination   Mentation: Alert oriented to time, place, history taking. Attention span and concentration appropriate. Recent and remote memory intact.  Follows all commands speech and language fluent.   Cranial nerve II-XII: Fundoscopic exam reveals sharp disc margins.Pupils were equal round reactive to light extraocular movements were full, visual field were full on confrontational test. Facial sensation and strength were normal. hearing was intact to finger rubbing bilaterally. Uvula tongue midline. head turning and shoulder  shrug were normal and symmetric.Tongue protrusion into cheek strength was normal. Motor: normal bulk and tone, full strength in the BUE, BLE, fine finger movements normal, no pronator drift. No focal weakness Sensory: normal and symmetric to light touch, pinprick, and  Vibration, Coordination: finger-nose-finger, heel-to-shin bilaterally, no dysmetria Reflexes: Brachioradialis 2/2, biceps 2/2, triceps 2/2, patellar 2/2, Achilles 2/2, plantar responses were flexor bilaterally. Gait and Station: Rising up from seated position without assistance, normal stance,  moderate stride, good arm swing, smooth turning, able to perform tiptoe, and heel walking without difficulty. Tandem gait is steady  DIAGNOSTIC DATA (LABS, IMAGING, TESTING) - I reviewed patient records, labs, notes, testing and imaging myself where available.  Lab Results  Component Value Date   WBC 5.0 09/11/2015   HGB 16.9 09/11/2015   HCT 50.9 (H) 09/11/2015   MCV 90.2 09/11/2015   PLT 185 09/11/2015      Component Value Date/Time   NA 142 09/11/2015 1021   K 4.8 09/11/2015 1021   CL 107 09/11/2015 1021   CO2 26 09/11/2015 1021   GLUCOSE 106 (H) 09/11/2015 1021   BUN 10 09/11/2015  1021   CREATININE 1.31 (H) 09/11/2015 1021   CALCIUM 9.5 09/11/2015 1021   PROT 7.7 09/07/2014 0011   ALBUMIN 3.9 09/07/2014 0011   AST 23 09/07/2014 0011   ALT 17 09/07/2014 0011   ALKPHOS 94 09/07/2014 0011   BILITOT 1.0 09/07/2014 0011   GFRNONAA >60 09/09/2014 0323   GFRAA >60 09/09/2014 0323   Lab Results  Component Value Date   CHOL 135 09/07/2014   HDL 48 09/07/2014   LDLCALC 77 09/07/2014   TRIG 50 09/07/2014   CHOLHDL 2.8 09/07/2014   Lab Results  Component Value Date   HGBA1C 6.1 (H) 09/07/2014    ASSESSMENT AND PLAN 65 y.o. male with PMH of atrial fibrillation on ASA was admitted on 09/06/14 for right BG, right CR, right opercular and right temporal lobe infarcts, consistent with embolic pattern, likely due to afib not  on anticoagulation. 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 EF 30-35%, LDL 77 and A1C 6.1. His ASA was changed to eliquis and add zocor. Followed up with cardiology, had nuclear stress test showed EF 27%, ans also cardiac cath recently, and showed no CAD. Has not completety quit smoking yet. On Eliquis and zocor. Followed with cardiology regularly. No stroke like symptoms during interval time. The patient is a current patient of Dr.Xu  who is out of the office today . This note is sent to the work in doctor.      PLAN:Stressed the importance of management of risk factors to prevent further stroke Continue eliquis and Zocor for secondary stroke prevention and atrial fibrillation Maintain strict control of hypertension with blood pressure goal below 130/90, today's reading 124/92  Cholesterol with LDL cholesterol less than 70,  continue statin drug  Exercise by walking, slowly increase , eat healthy diet with whole grains,  fresh fruits and vegetables Follow-up in 6 months Stop smoking Nilda Riggs, [facility], [facility], APRN  [facility] [address] [place], [place] [address] [phone]

## 2015-09-21 NOTE — Progress Notes (Signed)
I have read the note, and I agree with the clinical assessment and plan.  Moriah Loughry KEITH   

## 2015-09-23 IMAGING — NM NM MISC PROCEDURE
3 series · 18 of 18 positions shown · non-contrast
Comparison: none

[Series 1: rest-mc_(id)_sa · 6.4mm · 6.40mm/px · 6 of 64 frames shown]
[frame 6/64]
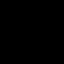
[frame 16/64]
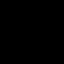
[frame 27/64]
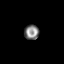
[frame 38/64]
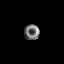
[frame 48/64]
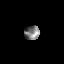
[frame 59/64]
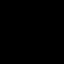

[Series 1: stress-gsp-mc_(id)_sa · 6.4mm · 6.40mm/px · 6 of 512 frames shown]
[frame 43/512]
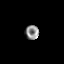
[frame 128/512]
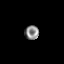
[frame 214/512]
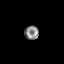
[frame 299/512]
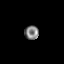
[frame 384/512]
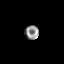
[frame 470/512]
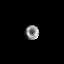

[Series 1: stress-sum-em-mc_(id)_sa · 6.4mm · 6.40mm/px · 6 of 64 frames shown]
[frame 6/64]
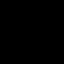
[frame 16/64]
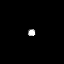
[frame 27/64]
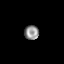
[frame 38/64]
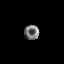
[frame 48/64]
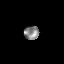
[frame 59/64]
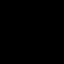

[18 of 18 positions shown; findings below may reference images not displayed]

Canned report from images found in remote index.

Refer to host system for actual result text.

## 2015-09-25 ENCOUNTER — Ambulatory Visit: Payer: Medicaid Other | Admitting: Neurology

## 2015-10-11 NOTE — Progress Notes (Signed)
Cardiology Office Note   Date:  10/13/2015   ID:  Jeffrey Martin, DOB January 15, 1951, MRN [id number]  PCP:  Lora Paula, MD  Cardiologist:   Rollene Rotunda, MD   Chief Complaint  Patient presents with  . Cardiomyopathy      History of Present Illness: Jeffrey Martin is a 65 y.o. male who presents for evaluation of cardiomyopathy and atrial fibrillation. He's had a previous CVA. He's been treated with anticoagulation.  His EF is 30-35%. His course is complicated by alcohol and tobacco use.  He has had hypotension and  bradycardic. In the past his beta blocker was slightly reduced and his Lasix dose reduced.  He has been followed in the HF clinic.  Complicating his treatment is the fact that his wife of 35 years died suddenly last Thanksgiving.  He says he's drinking alcohol rarely and almost completely off cigarettes.  At the last visit we did clarify his meds and we increased his beta blocker.  He returns for follow up.  He is doing well.  He tolerated the increased beta blocker.  The patient denies any new symptoms such as chest discomfort, neck or arm discomfort. There has been no new shortness of breath, PND or orthopnea. There have been no reported palpitations, presyncope or syncope.   Past Medical History:  Diagnosis Date  . Atrial fibrillation (HCC) 07/2014  . History of cardiovascular stress test    Lexiscan Myoview 7/16:  EF 27%, possible inferoapical ischemia; High Risk; no CAD at cath  . Hyperlipidemia Dx July 2016  . Hypertension Dx June 2016  . NICM (nonischemic cardiomyopathy) (HCC) 11/2014   EF 30-35% by echo, 27% by MV, no CAD at cath  . Peptic ulcer 1988  . Stroke [facility])     Past Surgical History:  Procedure Laterality Date  . CARDIAC CATHETERIZATION N/A 10/10/2014   Procedure: Right/Left Heart Cath and Coronary Angiography;  Surgeon: Tonny Bollman, MD; normal coronary arteries, normal LVEDP, systemic hypertension   . REPAIR OF PERFORATED ULCER        Current Outpatient Prescriptions  Medication Sig Dispense Refill  . apixaban (ELIQUIS) 5 MG TABS tablet Take 1 tablet (5 mg total) by mouth 2 (two) times daily. 60 tablet 11  . carvedilol (COREG) 12.5 MG tablet Take 1 tablet (12.5 mg total) by mouth 2 (two) times daily. 60 tablet 11  . furosemide (LASIX) 20 MG tablet Take 1 tablet (20 mg total) by mouth daily. 30 tablet 5  . irbesartan (AVAPRO) 300 MG tablet Take 1 tablet (300 mg total) by mouth daily. 30 tablet 0  . Multiple Vitamins-Minerals (MULTIVITAMIN & MINERAL PO) Take 1 tablet by mouth daily.    . simvastatin (ZOCOR) 20 MG tablet TAKE 1 TABLET BY MOUTH DAILY. 30 tablet 2  . carvedilol (COREG) 6.25 MG tablet Take 1 tablet (6.25 mg total) by mouth 2 (two) times daily. To be taken with 12.5 mg twice a day 60 tablet 11   No current facility-administered medications for this visit.     Allergies:   Review of patient's allergies indicates no known allergies.    ROS:  Please see the history of present illness.   Otherwise, review of systems are positive for none.   All other systems are reviewed and negative.    PHYSICAL EXAM: VS:  BP 126/74   Pulse 73   Ht 5\' 7"  (1.702 m)   Wt 143 lb (64.9 kg)   BMI 22.40 kg/m  , BMI Body  mass index is 22.4 kg/m. GENERAL:  Well appearing NECK:  No jugular venous distention, waveform within normal limits, carotid upstroke brisk and symmetric, no bruits, no thyromegaly LUNGS:  Clear to auscultation bilaterally BACK:  No CVA tenderness CHEST:  Unremarkable HEART:  PMI not displaced or sustained,S1 and S2 within normal limits, no S3, no clicks, no rubs, no murmurs, fixed split S2, irregular ABD:  Flat, positive bowel sounds normal in frequency in pitch, no bruits, no rebound, no guarding, no midline pulsatile mass, no hepatomegaly, no splenomegaly EXT:  2 plus pulses throughout, no edema, no cyanosis no clubbing   EKG:  EKG  is not ordered today.   Recent Labs: 09/11/2015: BUN 10; Creat  1.31; Hemoglobin 16.9; Platelets 185; Potassium 4.8; Sodium 142    Lipid Panel    Component Value Date/Time   CHOL 135 09/07/2014 0525   TRIG 50 09/07/2014 0525   HDL 48 09/07/2014 0525   CHOLHDL 2.8 09/07/2014 0525   VLDL 10 09/07/2014 0525   LDLCALC 77 09/07/2014 0525      Wt Readings from Last 3 Encounters:  10/13/15 143 lb (64.9 kg)  09/21/15 141 lb 12.8 oz (64.3 kg)  09/11/15 141 lb 12.8 oz (64.3 kg)      Other studies Reviewed: Additional studies/ records that were reviewed today include:  Review of the above records demonstrates:    ASSESSMENT AND PLAN:  CARDIOMYOPATHY:  We'll going to have him call and clarify his medications. In anticipation that he is taking them correctly I'm going to increase his carvedilol 18.75 mg twice daily.  I will check a basic metabolic profile.  HTN:  This is being managed in the context of treating his CHF  CVA:  This is being managed in the context of treating his atrial fibrillation.  ATRIAL FIB:  He says he's taking his anticoagulant. CBC was OK at the last visit. He is tolerating anticoagulatoin.   ETOH:  He is drinking a beer rarely  TOBACCO:  He was educated about this. I will continue to work on this.   Current medicines are reviewed at length with the patient today.  The patient does not have concerns regarding medicines.  The following changes have been made:  See above  Labs/ tests ordered today include: none No orders of the defined types were placed in this encounter.    Disposition:   FU with APP in 1/2 month.    Signed, Rollene RotundaJames Kieu Quiggle, MD  10/13/2015 8:53 AM    [facility]

## 2015-10-13 ENCOUNTER — Encounter: Payer: Self-pay | Admitting: Cardiology

## 2015-10-13 ENCOUNTER — Ambulatory Visit (INDEPENDENT_AMBULATORY_CARE_PROVIDER_SITE_OTHER): Payer: Medicaid Other | Admitting: Cardiology

## 2015-10-13 ENCOUNTER — Encounter (INDEPENDENT_AMBULATORY_CARE_PROVIDER_SITE_OTHER): Payer: Self-pay

## 2015-10-13 VITALS — BP 126/74 | HR 73 | Ht 67.0 in | Wt 143.0 lb

## 2015-10-13 DIAGNOSIS — I429 Cardiomyopathy, unspecified: Secondary | ICD-10-CM | POA: Diagnosis not present

## 2015-10-13 DIAGNOSIS — I4891 Unspecified atrial fibrillation: Secondary | ICD-10-CM | POA: Diagnosis not present

## 2015-10-13 MED ORDER — CARVEDILOL 6.25 MG PO TABS: 6.2500 mg | ORAL_TABLET | Freq: Two times a day (BID) | ORAL | 11 refills | Status: DC

## 2015-10-13 MED FILL — ?CARVEDILOL 6.25 MG TABLET: 6.25 | 30 days supply | Qty: 60 | Fill #0

## 2015-10-13 NOTE — Patient Instructions (Signed)
Medication Instructions:  START Carvedilol 6.25 mg to be taken with Carvedilol 12.5 mg twice a day  Labwork: None Ordered  Testing/Procedures: None Ordered  Follow-Up: Your physician recommends that you schedule a follow-up appointment in: 2 weeks with APP   Any Other Special Instructions Will Be Listed Below (If Applicable).   If you need a refill on your cardiac medications before your next appointment, please call your pharmacy.

## 2015-10-16 ENCOUNTER — Other Ambulatory Visit: Payer: Self-pay | Admitting: Family Medicine

## 2015-10-16 MED FILL — ?CARVEDILOL 12.5 MG TABLET: 12.5 | 30 days supply | Qty: 60 | Fill #1

## 2015-10-16 MED FILL — IRBESARTAN 300 MG TABLET: 300 | 30 days supply | Qty: 30 | Fill #0

## 2015-10-16 MED FILL — SIMVASTATIN 20 MG TABLET: 20 | 30 days supply | Qty: 30 | Fill #0

## 2015-10-27 ENCOUNTER — Encounter: Payer: Self-pay | Admitting: Physician Assistant

## 2015-10-27 ENCOUNTER — Ambulatory Visit (INDEPENDENT_AMBULATORY_CARE_PROVIDER_SITE_OTHER): Payer: Medicaid Other | Admitting: Physician Assistant

## 2015-10-27 VITALS — BP 128/82 | HR 62 | Ht 67.0 in | Wt 145.0 lb

## 2015-10-27 DIAGNOSIS — I428 Other cardiomyopathies: Secondary | ICD-10-CM

## 2015-10-27 DIAGNOSIS — I429 Cardiomyopathy, unspecified: Secondary | ICD-10-CM

## 2015-10-27 DIAGNOSIS — Z7901 Long term (current) use of anticoagulants: Secondary | ICD-10-CM | POA: Diagnosis not present

## 2015-10-27 DIAGNOSIS — I4821 Permanent atrial fibrillation: Secondary | ICD-10-CM

## 2015-10-27 DIAGNOSIS — I482 Chronic atrial fibrillation: Secondary | ICD-10-CM | POA: Diagnosis not present

## 2015-10-27 NOTE — Progress Notes (Signed)
Cardiology Office Note   Date:  10/27/2015   ID:  Jeffrey Martin, DOB August 16, 1950, MRN [id number]  PCP:  Lora PaulaFUNCHES, JOSALYN C, MD  Cardiologist:  Dr Luna KitchensHochrein  Barrett, Rhonda, PA-C   Chief Complaint  Patient presents with  . Follow-up    2wk f/u, no complaints     History of Present Illness: Jeffrey Martin is a 65 y.o. male with a history of atrial fib, HTN, HLD, NICM w/ EF 30-35% by echo 2016, ETOH/tob use (improved)  [facility] meds unclear, early f/u requested to make sure he is cardiac stable.  Jeffrey Martin presents for Ongoing evaluation and management of his cardiac issues.  He is tracking his weight and states that it has not increased or decreased. He is able to do what he would like to do. He is not having any problems or difficulties. He denies lower extremity edema, orthopnea, or PND.  He does not have palpitations and denies any history of presyncope or syncope.  He has his medications with him today. He is compliant with his medications, and is aware of which bottles need to be taken twice a day in which need to be taken once daily.  He is trying to eat healthy, but admits that his appetite is poor. He would like to weigh about 155 pounds. He is still struggling with the death of his wife. He is trying to quit smoking completely and rarely drinks any alcohol at all, and then only a small amount.   Past Medical History:  Diagnosis Date  . Atrial fibrillation (HCC) 07/2014  . History of cardiovascular stress test    Lexiscan Myoview 7/16:  EF 27%, possible inferoapical ischemia; High Risk; no CAD at cath  . Hyperlipidemia Dx July 2016  . Hypertension Dx June 2016  . NICM (nonischemic cardiomyopathy) (HCC) 11/2014   EF 30-35% by echo, 27% by MV, no CAD at cath  . Peptic ulcer 1988  . Stroke [facility](HCC)     Past Surgical History:  Procedure Laterality Date  . CARDIAC CATHETERIZATION N/A 10/10/2014   Procedure: Right/Left Heart Cath and Coronary  Angiography;  Surgeon: Tonny BollmanMichael Cooper, MD; normal coronary arteries, normal LVEDP, systemic hypertension   . REPAIR OF PERFORATED ULCER      Current Outpatient Prescriptions  Medication Sig Dispense Refill  . apixaban (ELIQUIS) 5 MG TABS tablet Take 1 tablet (5 mg total) by mouth 2 (two) times daily. 60 tablet 11  . carvedilol (COREG) 12.5 MG tablet Take 1 tablet (12.5 mg total) by mouth 2 (two) times daily. 60 tablet 11  . carvedilol (COREG) 6.25 MG tablet Take 1 tablet (6.25 mg total) by mouth 2 (two) times daily. To be taken with 12.5 mg twice a day 60 tablet 11  . furosemide (LASIX) 20 MG tablet Take 1 tablet (20 mg total) by mouth daily. 30 tablet 5  . irbesartan (AVAPRO) 300 MG tablet TAKE 1 TABLET BY MOUTH DAILY. 30 tablet 0  . Multiple Vitamins-Minerals (MULTIVITAMIN & MINERAL PO) Take 1 tablet by mouth daily.    . simvastatin (ZOCOR) 20 MG tablet TAKE 1 TABLET BY MOUTH DAILY. 30 tablet 0   No current facility-administered medications for this visit.     Allergies:   Review of patient's allergies indicates no known allergies.    Social History:  The patient  reports that he has been smoking Cigarettes.  He started smoking about 13 months ago. He has been smoking about 3.00 packs per day. He  has quit using smokeless tobacco. He reports that he drinks alcohol. He reports that he does not use drugs.   Family History:  The patient's family history includes Hypertension in his mother; Stroke in his maternal grandfather and mother.    ROS:  Please see the history of present illness. All other systems are reviewed and negative.    PHYSICAL EXAM: VS:  BP 128/82 (BP Location: Left Arm, Patient Position: Sitting, Cuff Size: Normal)   Pulse 62   Ht 5\' 7"  (1.702 m)   Wt 145 lb (65.8 kg)   BMI 22.71 kg/m  , BMI Body mass index is 22.71 kg/m. GEN: Well nourished, well developed, male in no acute distress  HEENT: normal for age  Neck: Mild JVD, no carotid bruit, no masses Cardiac:  Irregular rate and rhythm; no murmur, no rubs, or gallops Respiratory:  clear to auscultation bilaterally, normal work of breathing GI: soft, nontender, nondistended, + BS MS: no deformity or atrophy; no edema; distal pulses are 2+ in all 4 extremities   Skin: warm and dry, no rash Neuro:  Strength and sensation are intact Psych: euthymic mood, full affect   EKG:  EKG is not ordered today.   Recent Labs: 09/11/2015: BUN 10; Creat 1.31; Hemoglobin 16.9; Platelets 185; Potassium 4.8; Sodium 142    Lipid Panel    Component Value Date/Time   CHOL 135 09/07/2014 0525   TRIG 50 09/07/2014 0525   HDL 48 09/07/2014 0525   CHOLHDL 2.8 09/07/2014 0525   VLDL 10 09/07/2014 0525   LDLCALC 77 09/07/2014 0525     Wt Readings from Last 3 Encounters:  10/27/15 145 lb (65.8 kg)  10/13/15 143 lb (64.9 kg)  09/21/15 141 lb 12.8 oz (64.3 kg)     Other studies Reviewed: Additional studies/ records that were reviewed today include: Office notes and testing.  ASSESSMENT AND PLAN:  1.  Nonischemic cardiomyopathy: He is on good medical therapy with beta blocker, ARB, and low-dose Lasix. He has not required potassium supplementation. Continue to track weights and follow symptoms.  2. Atrial fibrillation: He is asymptomatic from this and his beta blocker was increased. He is tolerating the higher dose well.  3. Chronic anticoagulation: He is tolerating the Eliquis well. No bleeding issues. CHA2DS2VASc=4 (HTN, CHF, CVA x 2)  Current medicines are reviewed at length with the patient today.  The patient does not have concerns regarding medicines.  The following changes have been made:  no change  Labs/ tests ordered today include:  No orders of the defined types were placed in this encounter.    Disposition:   FU with Dr. Antoine Poche  Signed, Leanna Battles  10/27/2015 9:38 AM    [facility] Phone: [phone]; Fax: [phone]  This note was written with  the assistance of speech recognition software. Please excuse any transcriptional errors.

## 2015-10-27 NOTE — Patient Instructions (Addendum)
Medications:  Your physician recommends that you continue on your current medications as directed. Please refer to the Current Medication list given to you today.   Labwork:   Your physician recommends that you return for a FASTING lipid profile and CMET (nothing to eat or drink past midnight the night before, except water or black coffee) the morning of your follow-up appointment with Dr. Antoine Poche. Bring food to this appointment to eat after having your labwork.   Follow-Up:  Follow-up appointment scheduled with Dr. Antoine Poche on Friday, December 8 at 8:15am. Pleas arrive 15 minutes early to this appointment.  If you need a refill on your cardiac medications before your next appointment, please call your pharmacy.

## 2015-11-14 ENCOUNTER — Other Ambulatory Visit: Payer: Self-pay | Admitting: Cardiology

## 2015-11-14 ENCOUNTER — Other Ambulatory Visit: Payer: Self-pay | Admitting: Family Medicine

## 2015-11-14 MED FILL — ?FUROSEMIDE 40 MG TABLET: 40 | 30 days supply | Qty: 30 | Fill #0

## 2015-11-14 MED FILL — SIMVASTATIN 20 MG TABLET: 20 | 30 days supply | Qty: 30 | Fill #0

## 2015-11-14 MED FILL — CARVEDILOL 12.5 MG TABLET: 12.5 | 30 days supply | Qty: 60 | Fill #2

## 2015-11-14 MED FILL — CARVEDILOL 6.25 MG TABLET: 6.25 | 30 days supply | Qty: 60 | Fill #1

## 2015-11-14 NOTE — Telephone Encounter (Signed)
Rx(s) sent to pharmacy electronically.  

## 2015-11-15 ENCOUNTER — Other Ambulatory Visit: Payer: Self-pay | Admitting: Family Medicine

## 2015-11-15 MED FILL — IRBESARTAN 300 MG TABLET: 300 | 30 days supply | Qty: 30 | Fill #0

## 2015-12-13 ENCOUNTER — Other Ambulatory Visit: Payer: Self-pay | Admitting: Family Medicine

## 2015-12-13 ENCOUNTER — Other Ambulatory Visit: Payer: Self-pay | Admitting: Internal Medicine

## 2015-12-13 MED FILL — CARVEDILOL 12.5 MG TABLET: 12.5 | 30 days supply | Qty: 60 | Fill #3

## 2015-12-13 MED FILL — CARVEDILOL 6.25 MG TABLET: 6.25 | 30 days supply | Qty: 60 | Fill #2

## 2015-12-15 ENCOUNTER — Other Ambulatory Visit: Payer: Self-pay | Admitting: Internal Medicine

## 2015-12-15 ENCOUNTER — Other Ambulatory Visit: Payer: Self-pay | Admitting: Family Medicine

## 2015-12-15 MED FILL — IRBESARTAN 300 MG TABLET: 300 | 30 days supply | Qty: 30 | Fill #0

## 2015-12-15 MED FILL — SIMVASTATIN 20 MG TABLET: 20 | 30 days supply | Qty: 30 | Fill #0

## 2015-12-15 MED FILL — ?FUROSEMIDE 40 MG TABLET: 40 | 30 days supply | Qty: 30 | Fill #1

## 2015-12-18 ENCOUNTER — Telehealth: Payer: Self-pay | Admitting: Cardiology

## 2015-12-20 ENCOUNTER — Other Ambulatory Visit: Payer: Self-pay | Admitting: *Deleted

## 2015-12-20 MED ORDER — APIXABAN 5 MG PO TABS: 5.0000 mg | ORAL_TABLET | Freq: Two times a day (BID) | ORAL | 3 refills | Status: DC

## 2015-12-20 NOTE — Telephone Encounter (Signed)
Closed encounter °

## 2016-01-15 ENCOUNTER — Other Ambulatory Visit: Payer: Self-pay | Admitting: Family Medicine

## 2016-01-15 MED FILL — CARVEDILOL 6.25 MG TABLET: 6.25 | 30 days supply | Qty: 60 | Fill #3

## 2016-01-16 ENCOUNTER — Other Ambulatory Visit: Payer: Self-pay | Admitting: Family Medicine

## 2016-01-17 ENCOUNTER — Telehealth: Payer: Self-pay | Admitting: Cardiology

## 2016-01-17 NOTE — Telephone Encounter (Signed)
Son calling,says his father have not received his medicine from [facility].He said he called [facility] and they said they had not received anything from this office.

## 2016-01-17 NOTE — Telephone Encounter (Signed)
This is concerning the patient assistance forms for [employer] for Eliquis - did we receive anything and send? I have not seen this cross my desk.

## 2016-01-17 NOTE — Telephone Encounter (Signed)
Spoke to pt son letting him know form was faxed to [facility] ([facility] pt voice understanding, Eliquis samples was left at front for pt to pick up for his dad

## 2016-01-25 ENCOUNTER — Encounter: Payer: Self-pay | Admitting: Cardiology

## 2016-01-25 NOTE — Progress Notes (Deleted)
Cardiology Office Note   Date:  01/25/2016   ID:  Jeffrey Martin, DOB [birth year], MRN [id number]  PCP:  Lora PaulaFUNCHES, JOSALYN C, MD  Cardiologist:   Rollene RotundaJames Brea Coleson, MD   No chief complaint on file.     History of Present Illness: Jeffrey Martin is a 65 y.o. male who presents for evaluation of cardiomyopathy and atrial fibrillation. He's had a previous CVA. He's been treated with anticoagulation.  His EF is 30-35%. His course is complicated by alcohol and tobacco use.  He has had hypotension and  bradycardic. In the past his beta blocker was slightly reduced and his Lasix dose reduced.  He has been followed in the HF clinic.  Complicating his treatment is the fact that his wife of 35 years died suddenly last Thanksgiving.  He says he's drinking alcohol rarely and almost completely off cigarettes.  At the last visit I increased his Coreg.  ***  we did clarify his meds and we increased his beta blocker.  He returns for follow up.  He is doing well.  He tolerated the increased beta blocker.  The patient denies any new symptoms such as chest discomfort, neck or arm discomfort. There has been no new shortness of breath, PND or orthopnea. There have been no reported palpitations, presyncope or syncope.   Past Medical History:  Diagnosis Date  . Atrial fibrillation (HCC) 07/2014  . History of cardiovascular stress test    Lexiscan Myoview 7/16:  EF 27%, possible inferoapical ischemia; High Risk; no CAD at cath  . Hyperlipidemia Dx July 2016  . Hypertension Dx June 2016  . NICM (nonischemic cardiomyopathy) (HCC) 11/2014   EF 30-35% by echo, 27% by MV, no CAD at cath  . Peptic ulcer 1988  . Stroke [facility](HCC)     Past Surgical History:  Procedure Laterality Date  . CARDIAC CATHETERIZATION N/A 10/10/2014   Procedure: Right/Left Heart Cath and Coronary Angiography;  Surgeon: Tonny BollmanMichael Cooper, MD; normal coronary arteries, normal LVEDP, systemic hypertension   . REPAIR OF PERFORATED ULCER        Current Outpatient Prescriptions  Medication Sig Dispense Refill  . apixaban (ELIQUIS) 5 MG TABS tablet Take 1 tablet (5 mg total) by mouth 2 (two) times daily. 180 tablet 3  . carvedilol (COREG) 12.5 MG tablet Take 1 tablet (12.5 mg total) by mouth 2 (two) times daily. 60 tablet 11  . carvedilol (COREG) 6.25 MG tablet Take 1 tablet (6.25 mg total) by mouth 2 (two) times daily. To be taken with 12.5 mg twice a day 60 tablet 11  . furosemide (LASIX) 40 MG tablet TAKE 1 TABLET BY MOUTH DAILY 30 tablet 6  . irbesartan (AVAPRO) 300 MG tablet TAKE 1 TABLET BY MOUTH DAILY. 30 tablet 0  . Multiple Vitamins-Minerals (MULTIVITAMIN & MINERAL PO) Take 1 tablet by mouth daily.    . simvastatin (ZOCOR) 20 MG tablet TAKE 1 TABLET BY MOUTH DAILY. 30 tablet 0   No current facility-administered medications for this visit.     Allergies:   Patient has no known allergies.    ROS:  Please see the history of present illness.   Otherwise, review of systems are positive for ***.   All other systems are reviewed and negative.    PHYSICAL EXAM: VS:  There were no vitals taken for this visit. , BMI There is no height or weight on file to calculate BMI. GENERAL:  Well appearing NECK:  No jugular venous distention, waveform within normal limits, carotid  upstroke brisk and symmetric, no bruits, no thyromegaly LUNGS:  Clear to auscultation bilaterally BACK:  No CVA tenderness CHEST:  Unremarkable HEART:  PMI not displaced or sustained,S1 and S2 within normal limits, no S3, no clicks, no rubs, no murmurs, fixed split S2, irregular ABD:  Flat, positive bowel sounds normal in frequency in pitch, no bruits, no rebound, no guarding, no midline pulsatile mass, no hepatomegaly, no splenomegaly EXT:  2 plus pulses throughout, no edema, no cyanosis no clubbing   EKG:  EKG  is not ordered today.   Recent Labs: 09/11/2015: BUN 10; Creat 1.31; Hemoglobin 16.9; Platelets 185; Potassium 4.8; Sodium 142    Lipid  Panel    Component Value Date/Time   CHOL 135 09/07/2014 0525   TRIG 50 09/07/2014 0525   HDL 48 09/07/2014 0525   CHOLHDL 2.8 09/07/2014 0525   VLDL 10 09/07/2014 0525   LDLCALC 77 09/07/2014 0525      Wt Readings from Last 3 Encounters:  10/27/15 145 lb (65.8 kg)  10/13/15 143 lb (64.9 kg)  09/21/15 141 lb 12.8 oz (64.3 kg)      Other studies Reviewed: Additional studies/ records that were reviewed today include:  *** Review of the above records demonstrates:  ***  ASSESSMENT AND PLAN:  CARDIOMYOPATHY:  We'll going to have him call and clarify his medications. In anticipation that he is taking them correctly I'm going to increase his carvedilol *** mg twice daily.  I will check a basic metabolic profile.  HTN:  This is being managed in the context of treating his CHF  CVA:  This is being managed in the context of treating his atrial fibrillation.  ATRIAL FIB:  He says he's taking his anticoagulant. CBC was OK at the last visit. He is tolerating anticoagulatoin.   ETOH:  He is drinking a beer rarely  TOBACCO:  He was educated about this. I will continue to work on this.   Current medicines are reviewed at length with the patient today.  The patient does not have concerns regarding medicines.  The following changes have been made:  See above  Labs/ tests ordered today include: *** No orders of the defined types were placed in this encounter.    Disposition:   FU with ***month.    Signed, Rollene Rotunda, MD  01/25/2016 1:42 PM    [facility]

## 2016-01-26 ENCOUNTER — Encounter: Payer: Self-pay | Admitting: *Deleted

## 2016-01-26 ENCOUNTER — Ambulatory Visit: Payer: Self-pay | Admitting: Cardiology

## 2016-01-29 ENCOUNTER — Other Ambulatory Visit: Payer: Self-pay | Admitting: Family Medicine

## 2016-03-13 MED FILL — IRBESARTAN 300 MG TABLET: 300 | 30 days supply | Qty: 30 | Fill #0

## 2016-03-13 MED FILL — CARVEDILOL 6.25 MG TABLET: 6.25 | 30 days supply | Qty: 60 | Fill #4

## 2016-03-13 MED FILL — CARVEDILOL 12.5 MG TABLET: 12.5 | 30 days supply | Qty: 60 | Fill #4

## 2016-03-13 MED FILL — ?FUROSEMIDE 40 MG TABLET: 40 | 30 days supply | Qty: 30 | Fill #2

## 2016-03-15 MED FILL — SIMVASTATIN 20 MG TABLET: 20 | 30 days supply | Qty: 30 | Fill #0

## 2016-03-25 ENCOUNTER — Telehealth: Payer: Self-pay | Admitting: *Deleted

## 2016-03-25 NOTE — Telephone Encounter (Signed)
LMVM for pt to return call to rescheduled appt to earlier time.

## 2016-03-26 ENCOUNTER — Ambulatory Visit: Payer: Medicaid Other | Admitting: Nurse Practitioner

## 2016-03-26 NOTE — Telephone Encounter (Signed)
Spoke to pt and rescheduled to 05-17-16 at 0930. His phone # had changed   [phone].

## 2016-04-15 ENCOUNTER — Other Ambulatory Visit: Payer: Self-pay | Admitting: Family Medicine

## 2016-04-22 ENCOUNTER — Other Ambulatory Visit: Payer: Self-pay | Admitting: Family Medicine

## 2016-04-22 MED FILL — CARVEDILOL 12.5 MG TABLET: 12.5 | 30 days supply | Qty: 60 | Fill #5

## 2016-04-22 MED FILL — CARVEDILOL 6.25 MG TABLET: 6.25 | 30 days supply | Qty: 60 | Fill #5

## 2016-05-15 MED FILL — ?SIMVASTATIN 20 MG TABLET: 20 MG | 30 days supply | Qty: 30 | Fill #0

## 2016-05-15 MED FILL — IRBESARTAN 300 MG TABLET: 300 | 30 days supply | Qty: 30 | Fill #0

## 2016-05-17 ENCOUNTER — Ambulatory Visit (INDEPENDENT_AMBULATORY_CARE_PROVIDER_SITE_OTHER): Payer: Self-pay | Admitting: Nurse Practitioner

## 2016-05-17 ENCOUNTER — Encounter (INDEPENDENT_AMBULATORY_CARE_PROVIDER_SITE_OTHER): Payer: Self-pay

## 2016-05-17 ENCOUNTER — Encounter: Payer: Self-pay | Admitting: Nurse Practitioner

## 2016-05-17 VITALS — BP 119/84 | HR 88 | Ht 67.0 in | Wt 148.0 lb

## 2016-05-17 DIAGNOSIS — F17200 Nicotine dependence, unspecified, uncomplicated: Secondary | ICD-10-CM

## 2016-05-17 DIAGNOSIS — I63231 Cerebral infarction due to unspecified occlusion or stenosis of right carotid arteries: Secondary | ICD-10-CM

## 2016-05-17 DIAGNOSIS — I10 Essential (primary) hypertension: Secondary | ICD-10-CM

## 2016-05-17 DIAGNOSIS — I480 Paroxysmal atrial fibrillation: Secondary | ICD-10-CM

## 2016-05-17 DIAGNOSIS — E785 Hyperlipidemia, unspecified: Secondary | ICD-10-CM

## 2016-05-17 NOTE — Patient Instructions (Addendum)
Stressed the importance of management of risk factors to prevent further stroke Continue eliquis and Zocor for secondary stroke prevention and atrial fibrillation Maintain strict control of hypertension with blood pressure goal below 130/90, today's reading 119/84  Cholesterol with LDL cholesterol less than 70,  continue statin drug Simvastatin Exercise by walking,  eat healthy diet with whole grains,  fresh fruits and vegetables Stop smoking Need to make Follow up appt with Dr. Armen Pickup Discharge from neurologic services  Stroke Prevention Some health problems and behaviors may make it more likely for you to have a stroke. Below are ways to lessen your risk of having a stroke.  Be active for at least 30 minutes on most or all days.  Do not smoke. Try not to be around others who smoke.  Do not drink too much alcohol.  Do not have more than 2 drinks a day if you are a man.  Do not have more than 1 drink a day if you are a woman and are not pregnant.  Eat healthy foods, such as fruits and vegetables. If you were put on a specific diet, follow the diet as told.  Keep your cholesterol levels under control through diet and medicines. Look for foods that are low in saturated fat, trans fat, cholesterol, and are high in fiber.  If you have diabetes, follow all diet plans and take your medicine as told.  Ask your doctor if you need treatment to lower your blood pressure. If you have high blood pressure (hypertension), follow all diet plans and take your medicine as told by your doctor.  If you are 40-66 years old, have your blood pressure checked every 3-5 years. If you are age 57 or older, have your blood pressure checked every year.  Keep a healthy weight. Eat foods that are low in calories, salt, saturated fat, trans fat, and cholesterol.  Do not take drugs.  Avoid birth control pills, if this applies. Talk to your doctor about the risks of taking birth control pills.  Talk to your  doctor if you have sleep problems (sleep apnea).  Take all medicine as told by your doctor.  You may be told to take aspirin or blood thinner medicine. Take this medicine as told by your doctor.  Understand your medicine instructions.  Make sure any other conditions you have are being taken care of. Get help right away if:  You suddenly lose feeling (you feel numb) or have weakness in your face, arm, or leg.  Your face or eyelid hangs down to one side.  You suddenly feel confused.  You have trouble talking (aphasia) or understanding what people are saying.  You suddenly have trouble seeing in one or both eyes.  You suddenly have trouble walking.  You are dizzy.  You lose your balance or your movements are clumsy (uncoordinated).  You suddenly have a very bad headache and you do not know the cause.  You have new chest pain.  Your heart feels like it is fluttering or skipping a beat (irregular heartbeat). Do not wait to see if the symptoms above go away. Get help right away. Call your local emergency services (911 in [place] Do not drive yourself to the hospital. This information is not intended to replace advice given to you by your health care provider. Make sure you discuss any questions you have with your health care provider. Document Released: 08/06/2011 Document Revised: 07/13/2015 Document Reviewed: 08/07/2012 Elsevier Interactive Patient Education  2017 [employer].

## 2016-05-17 NOTE — Progress Notes (Signed)
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 is a 66 y.o. male with history of atrial fibrillation on ASA was admitted on 09/06/14 for left sided weakness and left facial droop. MRI showed right BG, right CR, right opercular and right temporal lobe infarcts, consistent with embolic pattern, likely due to afib not on anticoagulation. 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 EF 30-35%, LDL 77 and A1C 6.1. His ASA was changed to eliquis and add zocor. He was discharged to SNF. 11/22/14 follow up Dr Roda Shutters- the patient has been doing well. Followed up with cardiology for cardiomyopathy with EF 30-35%. Had nuclear stress test showed EF 27%, ans also cardiac cath recently, and showed no CAD. Otherwise, he feels good and no complains. Has not completety quit smoking yet.  Wife died in Jan 27, 2015  Interval History2/6/17 Dr Roda Shutters During the interval time, pt has been doing well from stroke standpoint. He has been followed with cardiology for afib and CHF. Currently on coreg for cardiomyopathy. He is also on lasix and avapro. Continued with eliquis without side effects. He stated compliance with eliquis. BP 122/90 UPDATE 08/03/2017CM [name], 66 year old male returns for follow-up.He had hospital admission 09/06/14 for right BG, right CR, right opercular and right temporal lobe infarcts, consistent with embolic pattern, likely due to afib not on anticoagulation. 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 He is currently on eliquis and Zocor for secondary stroke prevention and atrial fibrillation. He has not had further stroke or TIA symptoms. He is at home independent in all activities of daily living. He has regular follow-up with cardiology for cardiomyopathy. Recent  echocardiogram shows heart function is stable. Carotid ultrasound after last visit was stable. He continues to smoke. He occasionally has alcohol. He returns for reevaluation  UPDATE 03/30/2018CM [name], 66 year old male returns for follow-up with history of stroke July 2016 likely due to atrial fibrillation. He is currently on eliquis and Zocor for secondary stroke prevention and atrial fibrillation. He denies further stroke or TIA symptoms. He denies any bruising or bleeding. He denies any muscle aches. He does say he has been out of his Zocor for a day or 2, his pharmacy is closed today due to Good Friday. He has not had follow-up appointment with his primary care in a while and was made aware he needs to make appointment to continue to receive his medications. He remains independent at home with his activities of daily living. He has regular follow-up with cardiology. He continues to smoke and has cut back however he was encouraged to quit altogether. He continues to drink alcohol 1 or 2 beers a week. No falls no balance issues. No speech or swallowing difficulty. No weakness. He returns for reevaluation of REVIEW OF SYSTEMS: Full 14 system review of systems performed and notable only for those listed, all others are neg:  Constitutional: neg  Cardiovascular: neg Ear/Nose/Throat: neg  Skin: neg Eyes: neg Respiratory: neg Gastroitestinal: neg  Hematology/Lymphatic: neg  Endocrine: neg Musculoskeletal:neg Allergy/Immunology: neg Neurological: neg Psychiatric: neg Sleep : neg   ALLERGIES: No Known Allergies  HOME MEDICATIONS: Outpatient Medications Prior to Visit  Medication Sig Dispense Refill  . apixaban (ELIQUIS) 5 MG TABS tablet Take 1 tablet (5 mg total) by mouth 2 (two) times daily. 180 tablet  3  . carvedilol (COREG) 12.5 MG tablet Take 1 tablet (12.5 mg total) by mouth 2 (two) times daily. 60 tablet 11  . carvedilol (COREG) 6.25 MG tablet Take 1 tablet (6.25 mg total) by  mouth 2 (two) times daily. To be taken with 12.5 mg twice a day 60 tablet 11  . furosemide (LASIX) 40 MG tablet TAKE 1 TABLET BY MOUTH DAILY 30 tablet 6  . Multiple Vitamins-Minerals (MULTIVITAMIN & MINERAL PO) Take 1 tablet by mouth daily.    . irbesartan (AVAPRO) 300 MG tablet TAKE 1 TABLET BY MOUTH DAILY. (Patient not taking: Reported on 05/17/2016) 30 tablet 0  . simvastatin (ZOCOR) 20 MG tablet TAKE 1 TABLET BY MOUTH DAILY. (Patient not taking: Reported on 05/17/2016) 30 tablet 0   No facility-administered medications prior to visit.     PAST MEDICAL HISTORY: Past Medical History:  Diagnosis Date  . Atrial fibrillation (HCC) 07/2014  . History of cardiovascular stress test    Lexiscan Myoview 7/16:  EF 27%, possible inferoapical ischemia; High Risk; no CAD at cath  . Hyperlipidemia Dx July 2016  . Hypertension Dx June 2016  . NICM (nonischemic cardiomyopathy) (HCC) 11/2014   EF 30-35% by echo, 27% by MV, no CAD at cath  . Peptic ulcer 1988  . Stroke [facility])     PAST SURGICAL HISTORY: Past Surgical History:  Procedure Laterality Date  . CARDIAC CATHETERIZATION N/A 10/10/2014   Procedure: Right/Left Heart Cath and Coronary Angiography;  Surgeon: Tonny Bollman, MD; normal coronary arteries, normal LVEDP, systemic hypertension   . REPAIR OF PERFORATED ULCER      FAMILY HISTORY: Family History  Problem Relation Age of Onset  . Hypertension Mother   . Stroke Mother   . Stroke Maternal Grandfather   . Heart attack Neg Hx     SOCIAL HISTORY: Social History   Social History  . Marital status: Widowed    Spouse name: N/A  . Number of children: N/A  . Years of education: N/A   Occupational History  . Not on file.   Social History Main Topics  . Smoking status: Current Some Day Smoker    Packs/day: 0.50    Types: Cigarettes    Start date: 09/05/2014    Last attempt to quit: 09/05/2014  . Smokeless tobacco: Former Neurosurgeon  . Alcohol use 0.0 oz/week     Comment: rare  .  Drug use: No  . Sexual activity: Not on file   Other Topics Concern  . Not on file   Social History Narrative   Patient lives with his wife   His son or daughter-in-law will accompany him to appointments      PHYSICAL EXAM  Vitals:   05/17/16 0856  BP: 119/84  Pulse: 88  Weight: 148 lb (67.1 kg)  Height: 5\' 7"  (1.702 m)   Body mass index is 23.18 kg/m.  Generalized: Well developed, in no acute distress  Head: normocephalic and atraumatic,. Oropharynx benign  Neck: Supple, no carotid bruits  Cardiac: Irregularly irregular rhythm, no murmur  Musculoskeletal: No deformity   Neurological examination   Mentation: Alert oriented to time, place, history taking. Attention span and concentration appropriate. Recent and remote memory intact.  Follows all commands speech and language fluent.   Cranial nerve II-XII: .Pupils were equal round reactive to light extraocular movements were full, visual field were full on confrontational test. Facial sensation and strength were normal. hearing was intact to finger rubbing bilaterally. Uvula tongue midline. head turning and  shoulder shrug were normal and symmetric.Tongue protrusion into cheek strength was normal. Motor: normal bulk and tone, full strength in the BUE, BLE, fine finger movements normal, no pronator drift. No focal weakness Sensory: normal and symmetric to light touch, pinprick, and  Vibration in the upper and lower extremities, Coordination: finger-nose-finger, heel-to-shin bilaterally, no dysmetria Reflexes: Symmetric upper and lower, plantar responses were flexor bilaterally. Gait and Station: Rising up from seated position without assistance, normal stance,  moderate stride, good arm swing, smooth turning, able to perform tiptoe, and heel walking without difficulty. Tandem gait is steady. No assistive device  DIAGNOSTIC DATA (LABS, IMAGING, TESTING) - I reviewed patient records, labs, notes, testing and imaging myself where  available.  Lab Results  Component Value Date   WBC 5.0 09/11/2015   HGB 16.9 09/11/2015   HCT 50.9 (H) 09/11/2015   MCV 90.2 09/11/2015   PLT 185 09/11/2015      Component Value Date/Time   NA 142 09/11/2015 1021   K 4.8 09/11/2015 1021   CL 107 09/11/2015 1021   CO2 26 09/11/2015 1021   GLUCOSE 106 (H) 09/11/2015 1021   BUN 10 09/11/2015 1021   CREATININE 1.31 (H) 09/11/2015 1021   CALCIUM 9.5 09/11/2015 1021   PROT 7.7 09/07/2014 0011   ALBUMIN 3.9 09/07/2014 0011   AST 23 09/07/2014 0011   ALT 17 09/07/2014 0011   ALKPHOS 94 09/07/2014 0011   BILITOT 1.0 09/07/2014 0011   GFRNONAA >60 09/09/2014 0323   GFRAA >60 09/09/2014 0323    ASSESSMENT AND PLAN 66 y.o. male with PMH of atrial fibrillation on ASA was admitted on 09/06/14 for right BG, right CR, right opercular and right temporal lobe infarcts, consistent with embolic pattern, likely due to afib not on anticoagulation. 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 EF 30-35%, LDL 77 and A1C 6.1. His ASA was changed to eliquis and zocor added. . Followed up with cardiology, had nuclear stress test showed EF 27%,  also cardiac cath recently, and showed no CAD. Has not completety quit smoking yet.  Followed with cardiology regularly. No stroke like symptoms during interval time. The patient is a current patient of Dr.Xu  who is out of the office today . This note is sent to the work in doctor.      PLAN:Stressed the importance of management of risk factors to prevent further stroke Continue eliquis and Zocor for secondary stroke prevention and atrial fibrillation Maintain strict control of hypertension with blood pressure goal below 130/90, today's reading 119/84  Cholesterol with LDL cholesterol less than 70,  continue statin drug Simvastatin Exercise by walking,  eat healthy diet with whole grains,  fresh fruits and vegetables Stop smoking Need to make Follow up appt with Dr. Armen Pickup Discharge from neurologic  services I spent 15 min  in total face to face time with the patient more than 50% of which was spent counseling and coordination of care, reviewing test results reviewing medications and discussing and reviewing the diagnosis of stroke and stroke prevention.  Nilda Riggs, [facility], [facility], APRN  [facility] [address] [place], [place] [address] [phone] to see you again he enjoys -1

## 2016-05-17 NOTE — Progress Notes (Signed)
I agree with the above plan 

## 2016-06-21 ENCOUNTER — Ambulatory Visit: Payer: Medicaid Other | Admitting: Family Medicine

## 2016-07-03 ENCOUNTER — Encounter: Payer: Self-pay | Admitting: Family Medicine

## 2016-07-10 ENCOUNTER — Other Ambulatory Visit: Payer: Self-pay | Admitting: Family Medicine

## 2016-07-22 ENCOUNTER — Ambulatory Visit: Payer: Self-pay | Attending: Family Medicine | Admitting: Family Medicine

## 2016-07-22 ENCOUNTER — Other Ambulatory Visit: Payer: Self-pay | Admitting: Family Medicine

## 2016-07-22 ENCOUNTER — Encounter: Payer: Self-pay | Admitting: Family Medicine

## 2016-07-22 VITALS — BP 133/92 | HR 85 | Temp 97.7°F | Wt 148.2 lb

## 2016-07-22 DIAGNOSIS — Z23 Encounter for immunization: Secondary | ICD-10-CM

## 2016-07-22 DIAGNOSIS — I5022 Chronic systolic (congestive) heart failure: Secondary | ICD-10-CM | POA: Insufficient documentation

## 2016-07-22 DIAGNOSIS — Z7901 Long term (current) use of anticoagulants: Secondary | ICD-10-CM | POA: Insufficient documentation

## 2016-07-22 DIAGNOSIS — I10 Essential (primary) hypertension: Secondary | ICD-10-CM

## 2016-07-22 DIAGNOSIS — I63411 Cerebral infarction due to embolism of right middle cerebral artery: Secondary | ICD-10-CM

## 2016-07-22 DIAGNOSIS — I480 Paroxysmal atrial fibrillation: Secondary | ICD-10-CM | POA: Insufficient documentation

## 2016-07-22 DIAGNOSIS — F17210 Nicotine dependence, cigarettes, uncomplicated: Secondary | ICD-10-CM | POA: Insufficient documentation

## 2016-07-22 DIAGNOSIS — E785 Hyperlipidemia, unspecified: Secondary | ICD-10-CM | POA: Insufficient documentation

## 2016-07-22 DIAGNOSIS — Z79899 Other long term (current) drug therapy: Secondary | ICD-10-CM | POA: Insufficient documentation

## 2016-07-22 DIAGNOSIS — I110 Hypertensive heart disease with heart failure: Secondary | ICD-10-CM | POA: Insufficient documentation

## 2016-07-22 DIAGNOSIS — Z8673 Personal history of transient ischemic attack (TIA), and cerebral infarction without residual deficits: Secondary | ICD-10-CM | POA: Insufficient documentation

## 2016-07-22 DIAGNOSIS — F17200 Nicotine dependence, unspecified, uncomplicated: Secondary | ICD-10-CM

## 2016-07-22 MED ORDER — CARVEDILOL 6.25 MG PO TABS: 6.2500 mg | ORAL_TABLET | Freq: Two times a day (BID) | ORAL | 11 refills | Status: DC

## 2016-07-22 MED ORDER — APIXABAN 5 MG PO TABS: 5.0000 mg | ORAL_TABLET | Freq: Two times a day (BID) | ORAL | 11 refills | Status: DC

## 2016-07-22 MED ORDER — SIMVASTATIN 20 MG PO TABS: 20.0000 mg | ORAL_TABLET | Freq: Every day | ORAL | 11 refills | Status: DC

## 2016-07-22 MED ORDER — FUROSEMIDE 40 MG PO TABS: 20.0000 mg | ORAL_TABLET | Freq: Every day | ORAL | 11 refills | Status: DC

## 2016-07-22 MED ORDER — IRBESARTAN 300 MG PO TABS: 300.0000 mg | ORAL_TABLET | Freq: Every day | ORAL | 11 refills | Status: DC

## 2016-07-22 MED ORDER — CARVEDILOL 12.5 MG PO TABS: 12.5000 mg | ORAL_TABLET | Freq: Two times a day (BID) | ORAL | 11 refills | Status: DC

## 2016-07-22 MED FILL — IRBESARTAN 300 MG TABLET: 300 | 30 days supply | Qty: 30 | Fill #0

## 2016-07-22 MED FILL — ?FUROSEMIDE 40 MG TABLET: 40 | 60 days supply | Qty: 30 | Fill #0

## 2016-07-22 MED FILL — SIMVASTATIN 20 MG TABLET: 20 | 30 days supply | Qty: 30 | Fill #0

## 2016-07-22 MED FILL — CARVEDILOL 6.25 MG TABLET: 6.25 | 30 days supply | Qty: 60 | Fill #6

## 2016-07-22 MED FILL — CARVEDILOL 12.5 MG TABLET: 12.5 | 30 days supply | Qty: 60 | Fill #0

## 2016-07-22 NOTE — Assessment & Plan Note (Signed)
He is compensated Slight BP elevation off meds for 3 days  Refilled all meds Checking lipids and CMP

## 2016-07-22 NOTE — Progress Notes (Signed)
Subjective:  Patient ID: Jeffrey Martin, male    DOB: Dec 07, 1950  Age: 66 y.o. MRN: [id number]  CC: Congestive Heart Failure   HPI Jeffrey Martin has HTN, HLD, CVA, systolic CHF, A fib    1. Systolic CHF: last ECHO 04/2120, EF 30-35%. He is compliant with lasix, he reports taking 1/2 pill (20 mg) daily, coreg 18.75 mg BID, avapro 300 mg daily, eliquis 5 mg BID. He reports SOB at times. Denies CP, swelling, bleeding. Reports ran out of medicine 3 days ago. He is smoking 5 cigs per week and working to quit. He smokes when he drinks ETOH.    Social History  Substance Use Topics  . Smoking status: Current Some Day Smoker    Packs/day: 0.50    Types: Cigarettes    Start date: 09/05/2014    Last attempt to quit: 09/05/2014  . Smokeless tobacco: Former Systems developer  . Alcohol use 0.0 oz/week     Comment: rare    Social History  Substance Use Topics  . Smoking status: Current Some Day Smoker    Packs/day: 0.50    Types: Cigarettes    Start date: 09/05/2014    Last attempt to quit: 09/05/2014  . Smokeless tobacco: Former Systems developer  . Alcohol use 0.0 oz/week     Comment: rare   Outpatient Medications Prior to Visit  Medication Sig Dispense Refill  . apixaban (ELIQUIS) 5 MG TABS tablet Take 1 tablet (5 mg total) by mouth 2 (two) times daily. 180 tablet 3  . carvedilol (COREG) 12.5 MG tablet Take 1 tablet (12.5 mg total) by mouth 2 (two) times daily. 60 tablet 11  . carvedilol (COREG) 6.25 MG tablet Take 1 tablet (6.25 mg total) by mouth 2 (two) times daily. To be taken with 12.5 mg twice a day 60 tablet 11  . furosemide (LASIX) 40 MG tablet TAKE 1 TABLET BY MOUTH DAILY 30 tablet 6  . irbesartan (AVAPRO) 300 MG tablet TAKE 1 TABLET BY MOUTH DAILY. (Patient not taking: Reported on 05/17/2016) 30 tablet 0  . Multiple Vitamins-Minerals (MULTIVITAMIN & MINERAL PO) Take 1 tablet by mouth daily.    . simvastatin (ZOCOR) 20 MG tablet TAKE 1 TABLET BY MOUTH DAILY. (Patient not taking: Reported on  05/17/2016) 30 tablet 0   No facility-administered medications prior to visit.     ROS Review of Systems  Constitutional: Negative for chills, fatigue, fever and unexpected weight change.  Eyes: Negative for visual disturbance.  Respiratory: Positive for shortness of breath. Negative for cough.   Cardiovascular: Negative for chest pain, palpitations and leg swelling.  Gastrointestinal: Negative for abdominal pain, blood in stool, constipation, diarrhea, nausea and vomiting.  Endocrine: Negative for polydipsia, polyphagia and polyuria.  Musculoskeletal: Negative for arthralgias, back pain, gait problem, myalgias and neck pain.  Skin: Negative for rash.  Allergic/Immunologic: Negative for immunocompromised state.  Hematological: Negative for adenopathy. Does not bruise/bleed easily.  Psychiatric/Behavioral: Negative for dysphoric mood, sleep disturbance and suicidal ideas. The patient is not nervous/anxious.     Objective:  BP (!) 133/92   Pulse 85   Temp 97.7 F (36.5 C) (Oral)   Wt 148 lb 3.2 oz (67.2 kg)   SpO2 100%   BMI 23.21 kg/m   BP/Weight 07/22/2016 4/82/5003 7/0/4888  Systolic BP 916 945 038  Diastolic BP 92 84 82  Wt. (Lbs) 148.2 148 145  BMI 23.21 23.18 22.71    Physical Exam  Constitutional: He appears well-developed and well-nourished. No distress.  HENT:  Head: Normocephalic and atraumatic.  Neck: Normal range of motion. Neck supple.  Cardiovascular: Normal rate, normal heart sounds and intact distal pulses.  An irregularly irregular rhythm present.  Pulmonary/Chest: Effort normal and breath sounds normal.  Musculoskeletal: He exhibits no edema.  Neurological: He is alert.  Skin: Skin is warm and dry. No rash noted. No erythema.  Psychiatric: He has a normal mood and affect.    Assessment & Plan:  Jeffrey Martin was seen today for congestive heart failure.  Diagnoses and all orders for this visit:  Chronic systolic heart failure (HCC) -     furosemide (LASIX)  40 MG tablet; Take 0.5 tablets (20 mg total) by mouth daily. -     irbesartan (AVAPRO) 300 MG tablet; Take 1 tablet (300 mg total) by mouth daily.  Cerebral infarction due to embolism of right middle cerebral artery (HCC)  Paroxysmal atrial fibrillation (HCC) -     apixaban (ELIQUIS) 5 MG TABS tablet; Take 1 tablet (5 mg total) by mouth 2 (two) times daily. -     carvedilol (COREG) 12.5 MG tablet; Take 1 tablet (12.5 mg total) by mouth 2 (two) times daily. -     carvedilol (COREG) 6.25 MG tablet; Take 1 tablet (6.25 mg total) by mouth 2 (two) times daily. To be taken with 12.5 mg twice a day  Hyperlipidemia, unspecified hyperlipidemia type -     simvastatin (ZOCOR) 20 MG tablet; Take 1 tablet (20 mg total) by mouth daily. -     Lipid Panel -     CMP14+EGFR  Tobacco use disorder  Other orders -     Tdap vaccine greater than or equal to 7yo IM   There are no diagnoses linked to this encounter.  No orders of the defined types were placed in this encounter.   Follow-up: Return in about 3 months (around 10/24/452) for HTN, sytolic CHF .   Boykin Nearing MD

## 2016-07-22 NOTE — Patient Instructions (Addendum)
Jeffrey Martin was seen today for congestive heart failure.  Diagnoses and all orders for this visit:  Chronic systolic heart failure (HCC) -     furosemide (LASIX) 40 MG tablet; Take 0.5 tablets (20 mg total) by mouth daily. -     irbesartan (AVAPRO) 300 MG tablet; Take 1 tablet (300 mg total) by mouth daily.  Cerebral infarction due to embolism of right middle cerebral artery (HCC)  Paroxysmal atrial fibrillation (HCC) -     apixaban (ELIQUIS) 5 MG TABS tablet; Take 1 tablet (5 mg total) by mouth 2 (two) times daily. -     carvedilol (COREG) 12.5 MG tablet; Take 1 tablet (12.5 mg total) by mouth 2 (two) times daily. -     carvedilol (COREG) 6.25 MG tablet; Take 1 tablet (6.25 mg total) by mouth 2 (two) times daily. To be taken with 12.5 mg twice a day  Hyperlipidemia, unspecified hyperlipidemia type -     simvastatin (ZOCOR) 20 MG tablet; Take 1 tablet (20 mg total) by mouth daily. -     Lipid Panel -     CMP14+EGFR  Tobacco use disorder  Smoking cessation support: smoking cessation hotline: 1-800-QUIT-NOW.  Smoking cessation classes are available through [facility]. Call [phone] or visit our website at [email or web address]   F/u in 3 months for HTN/systolic CHF  Dr. Adrian Blackwater

## 2016-07-22 NOTE — Assessment & Plan Note (Signed)
CHA2DS2VASc=4 (HTN, CHF, CVA x 2) Continue eliquis 5 mg BID Refilled

## 2016-07-23 ENCOUNTER — Encounter: Payer: Self-pay | Admitting: Family Medicine

## 2016-07-23 LAB — CMP14+EGFR
ALK PHOS: 117 IU/L (ref 39–117)
ALT: 11 IU/L (ref 0–44)
AST: 17 IU/L (ref 0–40)
Albumin/Globulin Ratio: 1 — ABNORMAL LOW (ref 1.2–2.2)
Albumin: 3.4 g/dL — ABNORMAL LOW (ref 3.6–4.8)
BUN/Creatinine Ratio: 12 (ref 10–24)
BUN: 15 mg/dL (ref 8–27)
Bilirubin Total: 0.7 mg/dL (ref 0.0–1.2)
CO2: 23 mmol/L (ref 18–29)
CREATININE: 1.24 mg/dL (ref 0.76–1.27)
Calcium: 8.8 mg/dL (ref 8.6–10.2)
Chloride: 110 mmol/L — ABNORMAL HIGH (ref 96–106)
GFR calc Af Amer: 71 mL/min/{1.73_m2} (ref >59)
GFR calc non Af Amer: 61 mL/min/{1.73_m2} (ref >59)
GLOBULIN, TOTAL: 3.3 g/dL (ref 1.5–4.5)
GLUCOSE: 98 mg/dL (ref 65–99)
Potassium: 4.2 mmol/L (ref 3.5–5.2)
SODIUM: 145 mmol/L — AB (ref 134–144)
Total Protein: 6.7 g/dL (ref 6.0–8.5)

## 2016-07-23 LAB — LIPID PANEL
CHOL/HDL RATIO: 1.9 ratio (ref 0.0–5.0)
Cholesterol, Total: 124 mg/dL (ref 100–199)
HDL: 64 mg/dL (ref >39)
LDL CALC: 46 mg/dL (ref 0–99)
Triglycerides: 70 mg/dL (ref 0–149)
VLDL CHOLESTEROL CAL: 14 mg/dL (ref 5–40)

## 2016-07-23 NOTE — Assessment & Plan Note (Signed)
A bit HTN off meds for 3 days Refilled all meds F/u in 3 months

## 2016-07-23 NOTE — Assessment & Plan Note (Signed)
Cessation addressed He is cutting back

## 2016-08-14 ENCOUNTER — Telehealth: Payer: Self-pay

## 2016-08-14 NOTE — Telephone Encounter (Signed)
Pt was called and there is no VM set up to leave a message. 

## 2016-09-24 ENCOUNTER — Other Ambulatory Visit: Payer: Self-pay | Admitting: Cardiology

## 2016-10-22 ENCOUNTER — Ambulatory Visit: Payer: Self-pay | Admitting: Internal Medicine

## 2017-01-07 ENCOUNTER — Other Ambulatory Visit: Payer: Self-pay

## 2017-01-07 DIAGNOSIS — I480 Paroxysmal atrial fibrillation: Secondary | ICD-10-CM

## 2017-01-07 MED ORDER — APIXABAN 5 MG PO TABS: 5.0000 mg | ORAL_TABLET | Freq: Two times a day (BID) | ORAL | 0 refills | Status: DC

## 2017-01-20 ENCOUNTER — Other Ambulatory Visit: Payer: Self-pay | Admitting: *Deleted

## 2017-01-20 DIAGNOSIS — I480 Paroxysmal atrial fibrillation: Secondary | ICD-10-CM

## 2017-01-20 MED ORDER — APIXABAN 5 MG PO TABS: 5.0000 mg | ORAL_TABLET | Freq: Two times a day (BID) | ORAL | 3 refills | Status: DC

## 2017-02-03 ENCOUNTER — Telehealth: Payer: Self-pay | Admitting: *Deleted

## 2017-02-03 NOTE — Telephone Encounter (Signed)
Leave several messages pt son voicemail to come in and fill out patient assistance form for his Eliquis, I will mail form to pt house to fill out.

## 2017-02-14 ENCOUNTER — Telehealth: Payer: Self-pay | Admitting: *Deleted

## 2017-02-14 NOTE — Telephone Encounter (Signed)
Received signed form from pt today, faxed form along with sign rx for Eliquis to [employer], will await response.

## 2017-06-11 MED FILL — CARVEDILOL 6.25 MG TABLET: 6.25 | 30 days supply | Qty: 60 | Fill #0

## 2017-06-11 MED FILL — FUROSEMIDE 40 MG TAB: 40 | 60 days supply | Qty: 30 | Fill #1

## 2017-06-12 ENCOUNTER — Ambulatory Visit: Payer: Medicare Other | Attending: Family Medicine | Admitting: Physician Assistant

## 2017-06-12 VITALS — BP 166/95 | HR 80 | Temp 99.3°F | Resp 16 | Ht 67.0 in | Wt 135.2 lb

## 2017-06-12 DIAGNOSIS — Z8673 Personal history of transient ischemic attack (TIA), and cerebral infarction without residual deficits: Secondary | ICD-10-CM | POA: Diagnosis not present

## 2017-06-12 DIAGNOSIS — Z8711 Personal history of peptic ulcer disease: Secondary | ICD-10-CM | POA: Insufficient documentation

## 2017-06-12 DIAGNOSIS — I10 Essential (primary) hypertension: Secondary | ICD-10-CM

## 2017-06-12 DIAGNOSIS — I5022 Chronic systolic (congestive) heart failure: Secondary | ICD-10-CM

## 2017-06-12 DIAGNOSIS — I160 Hypertensive urgency: Secondary | ICD-10-CM

## 2017-06-12 DIAGNOSIS — I480 Paroxysmal atrial fibrillation: Secondary | ICD-10-CM | POA: Diagnosis not present

## 2017-06-12 DIAGNOSIS — Z79899 Other long term (current) drug therapy: Secondary | ICD-10-CM | POA: Diagnosis not present

## 2017-06-12 DIAGNOSIS — Z7901 Long term (current) use of anticoagulants: Secondary | ICD-10-CM | POA: Insufficient documentation

## 2017-06-12 DIAGNOSIS — I110 Hypertensive heart disease with heart failure: Secondary | ICD-10-CM | POA: Insufficient documentation

## 2017-06-12 DIAGNOSIS — E785 Hyperlipidemia, unspecified: Secondary | ICD-10-CM | POA: Diagnosis not present

## 2017-06-12 MED ORDER — FUROSEMIDE 40 MG PO TABS
20.0000 mg | ORAL_TABLET | Freq: Every day | ORAL | 11 refills | Status: DC
Start: 2017-06-12 — End: 2017-10-01

## 2017-06-12 MED ORDER — LOSARTAN POTASSIUM 100 MG PO TABS: 100.0000 mg | ORAL_TABLET | Freq: Every day | ORAL | 3 refills | Status: DC

## 2017-06-12 MED ORDER — CARVEDILOL 12.5 MG PO TABS: 12.5000 mg | ORAL_TABLET | Freq: Two times a day (BID) | ORAL | 11 refills | Status: DC

## 2017-06-12 MED ORDER — APIXABAN 5 MG PO TABS: 5.0000 mg | ORAL_TABLET | Freq: Two times a day (BID) | ORAL | 3 refills | Status: DC

## 2017-06-12 MED ORDER — SIMVASTATIN 20 MG PO TABS: 20.0000 mg | ORAL_TABLET | Freq: Every day | ORAL | 11 refills | Status: DC

## 2017-06-12 MED ORDER — IRBESARTAN 300 MG PO TABS: 300.0000 mg | ORAL_TABLET | Freq: Every day | ORAL | 11 refills | Status: DC

## 2017-06-12 MED ORDER — CLONIDINE HCL 0.2 MG PO TABS
0.2000 mg | ORAL_TABLET | Freq: Once | ORAL | Status: AC
  Administered 2017-06-12: 0.2 mg via ORAL

## 2017-06-12 MED FILL — SIMVASTATIN 20 MG TABLET: 20 | 30 days supply | Qty: 30 | Fill #0

## 2017-06-12 MED FILL — CARVEDILOL 12.5 MG TABLET: 12.5 | 30 days supply | Qty: 60 | Fill #0

## 2017-06-12 MED FILL — LOSARTAN POTASSIUM 100 MG T: 100 | 30 days supply | Qty: 30 | Fill #0

## 2017-06-12 NOTE — Progress Notes (Signed)
Pt. Is here for medication refill.  Pt. Stated he ran out about a month 1/2.

## 2017-06-12 NOTE — Progress Notes (Signed)
Patient ID: Jeffrey Martin, male   DOB: Apr 05, 1950, 67 y.o.   MRN: [id number]   Jeffrey Martin, is a 67 y.o. male  XBJ:[id number]  HYQ:[id number]  DOB - 07-29-1950  Subjective:  Chief Complaint and HPI: Jeffrey Martin is a 67 y.o. male here today out of his meds for 2-3 months.  He has been going back and forth bt here and [place].  He denies any CP/HA/palpitations/dizziness.     ROS:   Constitutional:  No f/c, No night sweats, No unexplained weight loss. EENT:  No vision changes, No blurry vision, No hearing changes. No mouth, throat, or ear problems.  Respiratory: No cough, No SOB Cardiac: No CP, no palpitations GI:  No abd pain, No N/V/D. GU: No Urinary s/sx Musculoskeletal: No joint pain Neuro: No headache, no dizziness, no motor weakness.  Skin: No rash Endocrine:  No polydipsia. No polyuria.  Psych: Denies SI/HI  No problems updated.  ALLERGIES: No Known Allergies  PAST MEDICAL HISTORY: Past Medical History:  Diagnosis Date  . Atrial fibrillation (HCC) 07/2014  . History of cardiovascular stress test    Lexiscan Myoview 7/16:  EF 27%, possible inferoapical ischemia; High Risk; no CAD at cath  . Hyperlipidemia Dx July 2016  . Hypertension Dx June 2016  . NICM (nonischemic cardiomyopathy) (HCC) 11/2014   EF 30-35% by echo, 27% by MV, no CAD at cath  . Peptic ulcer 1988  . Stroke [facility])     MEDICATIONS AT HOME: Prior to Admission medications   Medication Sig Start Date End Date Taking? Authorizing Provider  apixaban (ELIQUIS) 5 MG TABS tablet Take 1 tablet (5 mg total) by mouth 2 (two) times daily. 06/12/17  Yes Georgian Co M, PA-C  carvedilol (COREG) 12.5 MG tablet Take 1 tablet (12.5 mg total) by mouth 2 (two) times daily. 06/12/17  Yes Anders Simmonds, PA-C  furosemide (LASIX) 40 MG tablet Take 0.5 tablets (20 mg total) by mouth daily. 06/12/17  Yes Seirra Kos M, PA-C  irbesartan (AVAPRO) 300 MG tablet Take 1 tablet (300 mg total) by mouth daily.  06/12/17  Yes Tinleigh Whitmire, Marzella Schlein, PA-C  Multiple Vitamins-Minerals (MULTIVITAMIN & MINERAL PO) Take 1 tablet by mouth daily.   Yes [provider]  simvastatin (ZOCOR) 20 MG tablet Take 1 tablet (20 mg total) by mouth daily. 06/12/17  Yes Anders Simmonds, PA-C     Objective:  EXAM:   Vitals:   06/12/17 1003  BP: (!) 205/155  Pulse: 80  Resp: 16  SpO2: 100%  Weight: 135 lb 3.2 oz (61.3 kg)  Height: 5\' 7"  (1.702 m)    General appearance : A&OX3. NAD. Non-toxic-appearing HEENT: Atraumatic and Normocephalic.  PERRLA. EOM intact.  Neck: supple, no JVD. No cervical lymphadenopathy. No thyromegaly Chest/Lungs:  Breathing-non-labored, Good air entry bilaterally, breath sounds normal without rales, rhonchi, or wheezing  CVS: S1 S2 regular, no murmurs, gallops, rubs  Extremities: Bilateral Lower Ext shows no edema, both legs are warm to touch with = pulse throughout Neurology:  CN II-XII grossly intact, Non focal.   Psych:  TP linear. J/I WNL. Normal speech. Appropriate eye contact and affect.  Skin:  No Rash  Data Review Lab Results  Component Value Date   HGBA1C 6.1 (H) 09/07/2014     Assessment & Plan   1. Hypertensive urgency - cloNIDine (CATAPRES) tablet 0.2 mg BP down to 166/95 after clonidine.  He is instructed to call 911 should he have any s/sx of concern.  He agrees.  2. Essential hypertension Resume medications  - Comprehensive metabolic panel - CBC with Differential/Platelet  3. Paroxysmal atrial fibrillation (HCC) Resume- - apixaban (ELIQUIS) 5 MG TABS tablet; Take 1 tablet (5 mg total) by mouth 2 (two) times daily.  Dispense: 180 tablet; Refill: 3 - carvedilol (COREG) 12.5 MG tablet; Take 1 tablet (12.5 mg total) by mouth 2 (two) times daily.  Dispense: 60 tablet; Refill: 11  4. Chronic systolic heart failure (HCC) Resume medications - furosemide (LASIX) 40 MG tablet; Take 0.5 tablets (20 mg total) by mouth daily.  Dispense: 30 tablet; Refill:  11 This is on back order - irbesartan (AVAPRO) 300 MG tablet; Take 1 tablet (300 mg total) by mouth daily.  Dispense: 30 tablet; Refill: 11, so we will do Losartan 100mg  daily instead-  5. Hyperlipidemia, unspecified hyperlipidemia type Resume medications - Lipid panel - simvastatin (ZOCOR) 20 MG tablet; Take 1 tablet (20 mg total) by mouth daily.  Dispense: 30 tablet; Refill: 11   Patient have been counseled extensively about nutrition and exercise  Return in about 2 weeks (around 06/26/2017) for appt with [facility] for BP check and appt to assign new PCP in 6 weeks.  The patient was given clear instructions to go to ER or return to medical center if symptoms don't improve, worsen or new problems develop. The patient verbalized understanding. The patient was told to call to get lab results if they haven't heard anything in the next week.     Georgian Co, PA-C [facility] and [facility] [employer], [place] [phone]   06/12/2017, 10:18 AM

## 2017-06-12 NOTE — Patient Instructions (Signed)
Check blood pressure 3-5 times weekly and record and bring to next visit.    Hypertension Hypertension is another name for high blood pressure. High blood pressure forces your heart to work harder to pump blood. This can cause problems over time. There are two numbers in a blood pressure reading. There is a top number (systolic) over a bottom number (diastolic). It is best to have a blood pressure below 120/80. Healthy choices can help lower your blood pressure. You may need medicine to help lower your blood pressure if:  Your blood pressure cannot be lowered with healthy choices.  Your blood pressure is higher than 130/80.  Follow these instructions at home: Eating and drinking  If directed, follow the DASH eating plan. This diet includes: ? Filling half of your plate at each meal with fruits and vegetables. ? Filling one quarter of your plate at each meal with whole grains. Whole grains include whole wheat pasta, brown rice, and whole grain bread. ? Eating or drinking low-fat dairy products, such as skim milk or low-fat yogurt. ? Filling one quarter of your plate at each meal with low-fat (lean) proteins. Low-fat proteins include fish, skinless chicken, eggs, beans, and tofu. ? Avoiding fatty meat, cured and processed meat, or chicken with skin. ? Avoiding premade or processed food.  Eat less than 1,500 mg of salt (sodium) a day.  Limit alcohol use to no more than 1 drink a day for nonpregnant women and 2 drinks a day for men. One drink equals 12 oz of beer, 5 oz of wine, or 1 oz of hard liquor. Lifestyle  Work with your doctor to stay at a healthy weight or to lose weight. Ask your doctor what the best weight is for you.  Get at least 30 minutes of exercise that causes your heart to beat faster (aerobic exercise) most days of the week. This may include walking, swimming, or biking.  Get at least 30 minutes of exercise that strengthens your muscles (resistance exercise) at least 3  days a week. This may include lifting weights or pilates.  Do not use any products that contain nicotine or tobacco. This includes cigarettes and e-cigarettes. If you need help quitting, ask your doctor.  Check your blood pressure at home as told by your doctor.  Keep all follow-up visits as told by your doctor. This is important. Medicines  Take over-the-counter and prescription medicines only as told by your doctor. Follow directions carefully.  Do not skip doses of blood pressure medicine. The medicine does not work as well if you skip doses. Skipping doses also puts you at risk for problems.  Ask your doctor about side effects or reactions to medicines that you should watch for. Contact a doctor if:  You think you are having a reaction to the medicine you are taking.  You have headaches that keep coming back (recurring).  You feel dizzy.  You have swelling in your ankles.  You have trouble with your vision. Get help right away if:  You get a very bad headache.  You start to feel confused.  You feel weak or numb.  You feel faint.  You get very bad pain in your: ? Chest. ? Belly (abdomen).  You throw up (vomit) more than once.  You have trouble breathing. Summary  Hypertension is another name for high blood pressure.  Making healthy choices can help lower blood pressure. If your blood pressure cannot be controlled with healthy choices, you may need to take  medicine. This information is not intended to replace advice given to you by your health care provider. Make sure you discuss any questions you have with your health care provider. Document Released: 07/24/2007 Document Revised: 01/03/2016 Document Reviewed: 01/03/2016 Elsevier Interactive Patient Education  [employer].

## 2017-06-13 ENCOUNTER — Telehealth: Payer: Self-pay

## 2017-06-13 LAB — CBC WITH DIFFERENTIAL/PLATELET
BASOS: 0 %
Basophils Absolute: 0 10*3/uL (ref 0.0–0.2)
EOS (ABSOLUTE): 0 10*3/uL (ref 0.0–0.4)
EOS: 0 %
HEMATOCRIT: 47.4 % (ref 37.5–51.0)
HEMOGLOBIN: 15.3 g/dL (ref 13.0–17.7)
IMMATURE GRANS (ABS): 0 10*3/uL (ref 0.0–0.1)
Immature Granulocytes: 0 %
LYMPHS ABS: 1.2 10*3/uL (ref 0.7–3.1)
Lymphs: 20 %
MCH: 30.2 pg (ref 26.6–33.0)
MCHC: 32.3 g/dL (ref 31.5–35.7)
MCV: 94 fL (ref 79–97)
MONOCYTES: 9 %
Monocytes Absolute: 0.5 10*3/uL (ref 0.1–0.9)
NEUTROS ABS: 4.1 10*3/uL (ref 1.4–7.0)
Neutrophils: 71 %
Platelets: 150 10*3/uL (ref 150–379)
RBC: 5.06 x10E6/uL (ref 4.14–5.80)
RDW: 15.4 % (ref 12.3–15.4)
WBC: 5.8 10*3/uL (ref 3.4–10.8)

## 2017-06-13 LAB — LIPID PANEL
CHOL/HDL RATIO: 2 ratio (ref 0.0–5.0)
Cholesterol, Total: 145 mg/dL (ref 100–199)
HDL: 74 mg/dL (ref >39)
LDL CALC: 58 mg/dL (ref 0–99)
Triglycerides: 63 mg/dL (ref 0–149)
VLDL CHOLESTEROL CAL: 13 mg/dL (ref 5–40)

## 2017-06-13 LAB — COMPREHENSIVE METABOLIC PANEL
ALBUMIN: 4.1 g/dL (ref 3.6–4.8)
ALT: 18 IU/L (ref 0–44)
AST: 25 IU/L (ref 0–40)
Albumin/Globulin Ratio: 1.2 (ref 1.2–2.2)
Alkaline Phosphatase: 141 IU/L — ABNORMAL HIGH (ref 39–117)
BILIRUBIN TOTAL: 0.7 mg/dL (ref 0.0–1.2)
BUN / CREAT RATIO: 12 (ref 10–24)
BUN: 16 mg/dL (ref 8–27)
CALCIUM: 9.6 mg/dL (ref 8.6–10.2)
CO2: 21 mmol/L (ref 20–29)
CREATININE: 1.36 mg/dL — AB (ref 0.76–1.27)
Chloride: 106 mmol/L (ref 96–106)
GFR, EST AFRICAN AMERICAN: 63 mL/min/{1.73_m2} (ref >59)
GFR, EST NON AFRICAN AMERICAN: 55 mL/min/{1.73_m2} — AB (ref >59)
Globulin, Total: 3.4 g/dL (ref 1.5–4.5)
Glucose: 131 mg/dL — ABNORMAL HIGH (ref 65–99)
Potassium: 3.8 mmol/L (ref 3.5–5.2)
Sodium: 144 mmol/L (ref 134–144)
TOTAL PROTEIN: 7.5 g/dL (ref 6.0–8.5)

## 2017-06-13 NOTE — Telephone Encounter (Signed)
-----   Message from Anders Simmonds, [place] sent at 06/13/2017 11:37 AM EDT ----- Please call patient.  Labs are ok except kidney function is a little impaired.  Restarting his meds should help with this.  We will recheck labs at his follow-up. Thanks, Georgian Co, PA-C

## 2017-06-13 NOTE — Telephone Encounter (Signed)
CMA spoke to patient to inform on lab results.  Patient understood. No questions.

## 2017-06-23 NOTE — Progress Notes (Deleted)
   S:    Patient arrives in good spirits. Presents to the clinic for hypertension evaluation. Patient was referred on on 06/12/17 by Marylene Land. He has an appointment to establish care with Dr. Laural Benes on 07/15/17.   When he saw Marylene Land on 4/25, he reported being without medications for ~2-3 months. His BP was taken at that visit and recorded as 205/155. After clonidine 0.2mg  administered BP decreased to 166/95. Of note, I checked through the pharmacy recordsand the last time he picked up BP medications from our pharmacy was in June 2018.   Patient {Actions; denies-reports:120008} adherence with medications.  Current BP Medications include:  Losartan 100mg  daily, carvedilol 12.5mg  BID   Antihypertensives tried in the past include: irbesartan 75mg  daily, irbesartan 300mg  daily   Pertinent social history:  Current 0.5 pack/day smoker Mild alcohol use disorder  Dietary habits include: Sodium-rich foods: *** Caffeine intake: ***  O:   Last 3 Office BP readings: BP Readings from Last 3 Encounters:  06/12/17 (!) 166/95  07/22/16 (!) 133/92  05/17/16 119/84    BMET    Component Value Date/Time   NA 144 06/12/2017 1059   K 3.8 06/12/2017 1059   CL 106 06/12/2017 1059   CO2 21 06/12/2017 1059   GLUCOSE 131 (H) 06/12/2017 1059   GLUCOSE 106 (H) 09/11/2015 1021   BUN 16 06/12/2017 1059   CREATININE 1.36 (H) 06/12/2017 1059   CREATININE 1.31 (H) 09/11/2015 1021   CALCIUM 9.6 06/12/2017 1059   GFRNONAA 55 (L) 06/12/2017 1059   GFRAA 63 06/12/2017 1059    A/P: Hypertension longstanding diagnosed currently *** on current medications.   Delete if not applicable: Pt with pulse of ***. Currently has room to increase carvedilol from 12.5mg  to 25mg  daily. BP-lowering would be advantageous with carvedilol given alpha blocking ability in addition to beta. After discussion with Dr. Laural Benes, will increase carvedilol to 25mg  daily and have patient follow-up with her. Reviewed plan as well as  potential side effects. Pt was amenable to change.   Delete if not applicable: Pt's previous regimen contained irbesartan 300mg  daily and his BP appeared to be better controlled. Will change to irbesartan 300mg  daily. Pt's renal function noted to be impaired at visit with Marylene Land. He will have follow-up labs when he sees Dr. Laural Benes. Recommend to d/c ARB if creatinine levels increase to 30% above baseline.   Delete if not applicable: Pt's BP today was >160/100. Pt was referred to ***.   Results reviewed and written information provided.   Total time in face-to-face counseling 15 minutes.   F/U Clinic Visit with Dr. Laural Benes on 07/15/17.

## 2017-06-25 ENCOUNTER — Ambulatory Visit: Payer: Medicare Other | Admitting: Pharmacist

## 2017-07-11 MED FILL — CARVEDILOL 12.5 MG TABLET: 12.5 | 30 days supply | Qty: 60 | Fill #1

## 2017-07-11 MED FILL — SIMVASTATIN 20 MG TABLET: 20 | 30 days supply | Qty: 30 | Fill #1

## 2017-07-11 MED FILL — FUROSEMIDE 40 MG TAB: 40 | 30 days supply | Qty: 15 | Fill #0

## 2017-07-15 ENCOUNTER — Ambulatory Visit: Payer: Medicare Other | Admitting: Internal Medicine

## 2017-07-15 MED FILL — LOSARTAN POTASSIUM 100 MG T: 100 | 30 days supply | Qty: 30 | Fill #1

## 2017-07-15 MED FILL — CARVEDILOL 6.25 MG TABLET: 6.25 | 30 days supply | Qty: 60 | Fill #1

## 2017-08-14 ENCOUNTER — Ambulatory Visit: Payer: Medicare Other | Admitting: Internal Medicine

## 2017-08-15 MED FILL — FUROSEMIDE 40 MG TAB: 40 | 30 days supply | Qty: 15 | Fill #1

## 2017-08-15 MED FILL — SIMVASTATIN 20 MG TABLET: 20 | 30 days supply | Qty: 30 | Fill #2

## 2017-08-15 MED FILL — LOSARTAN POTASSIUM 100 MG T: 100 | 30 days supply | Qty: 30 | Fill #2

## 2017-08-15 MED FILL — CARVEDILOL 12.5 MG TABLET: 12.5 | 30 days supply | Qty: 60 | Fill #2

## 2017-09-19 ENCOUNTER — Ambulatory Visit: Payer: Medicare Other | Admitting: Internal Medicine

## 2017-09-19 MED FILL — FUROSEMIDE 40 MG TAB: 40 | 30 days supply | Qty: 15 | Fill #2

## 2017-09-19 MED FILL — LOSARTAN POTASSIUM 100 MG T: 100 | 30 days supply | Qty: 30 | Fill #3

## 2017-09-19 MED FILL — SIMVASTATIN 20 MG TABLET: 20 | 30 days supply | Qty: 30 | Fill #3

## 2017-09-19 MED FILL — CARVEDILOL 12.5 MG TABLET: 12.5 | 30 days supply | Qty: 60 | Fill #3

## 2017-09-29 ENCOUNTER — Encounter: Payer: Self-pay | Admitting: Internal Medicine

## 2017-09-29 ENCOUNTER — Ambulatory Visit: Payer: Medicare Other | Attending: Internal Medicine | Admitting: Internal Medicine

## 2017-09-29 VITALS — BP 129/100 | HR 67 | Temp 97.5°F | Resp 16 | Ht 67.0 in | Wt 138.4 lb

## 2017-09-29 DIAGNOSIS — I10 Essential (primary) hypertension: Secondary | ICD-10-CM

## 2017-09-29 DIAGNOSIS — F17210 Nicotine dependence, cigarettes, uncomplicated: Secondary | ICD-10-CM | POA: Diagnosis not present

## 2017-09-29 DIAGNOSIS — I255 Ischemic cardiomyopathy: Secondary | ICD-10-CM | POA: Diagnosis not present

## 2017-09-29 DIAGNOSIS — I5022 Chronic systolic (congestive) heart failure: Secondary | ICD-10-CM | POA: Insufficient documentation

## 2017-09-29 DIAGNOSIS — E785 Hyperlipidemia, unspecified: Secondary | ICD-10-CM | POA: Insufficient documentation

## 2017-09-29 DIAGNOSIS — Z7901 Long term (current) use of anticoagulants: Secondary | ICD-10-CM | POA: Insufficient documentation

## 2017-09-29 DIAGNOSIS — F1010 Alcohol abuse, uncomplicated: Secondary | ICD-10-CM | POA: Insufficient documentation

## 2017-09-29 DIAGNOSIS — F17200 Nicotine dependence, unspecified, uncomplicated: Secondary | ICD-10-CM | POA: Diagnosis not present

## 2017-09-29 DIAGNOSIS — I482 Chronic atrial fibrillation, unspecified: Secondary | ICD-10-CM

## 2017-09-29 DIAGNOSIS — I6521 Occlusion and stenosis of right carotid artery: Secondary | ICD-10-CM | POA: Insufficient documentation

## 2017-09-29 DIAGNOSIS — I428 Other cardiomyopathies: Secondary | ICD-10-CM | POA: Diagnosis not present

## 2017-09-29 DIAGNOSIS — Z79899 Other long term (current) drug therapy: Secondary | ICD-10-CM | POA: Diagnosis not present

## 2017-09-29 DIAGNOSIS — I110 Hypertensive heart disease with heart failure: Secondary | ICD-10-CM | POA: Insufficient documentation

## 2017-09-29 DIAGNOSIS — Z8673 Personal history of transient ischemic attack (TIA), and cerebral infarction without residual deficits: Secondary | ICD-10-CM | POA: Diagnosis not present

## 2017-09-29 MED ORDER — NICOTINE 14 MG/24HR TD PT24: 14.0000 mg | MEDICATED_PATCH | Freq: Every day | TRANSDERMAL | 1 refills | Status: DC

## 2017-09-29 MED ORDER — AMLODIPINE BESYLATE 5 MG PO TABS: 5.0000 mg | ORAL_TABLET | Freq: Every day | ORAL | 3 refills | Status: DC

## 2017-09-29 MED FILL — AMLODIPINE BESYLATE 5 MG TA: 5 | 30 days supply | Qty: 30 | Fill #0

## 2017-09-29 NOTE — Patient Instructions (Signed)
Please give appointment with Franky Macho in 2 weeks for repeat blood pressure check.  Start amlodipine 5 mg daily.

## 2017-09-29 NOTE — Progress Notes (Signed)
Patient ID: Jeffrey Martin, male    DOB: 01-06-1951  MRN: [id number]  CC: re-establish and Hypertension   Subjective: Jeffrey Martin is a 67 y.o. male who presents for chronic disease management and to establish with me as PCP. His concerns today include:  Patient with history of HTN, HL, A. 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 he tries to limit salt in the foods. No CP/LE edema.  Occasional SOB.  No PND orthopnea  A.fib/CVA:  No bruising or bleeding on Eliquis.  No palpitations -smoking 1 pk a wk. He has cut back from 1 pk Q 3 days.  Trying to quit.  Would like to try the nicotine patches.   Patient Active Problem List   Diagnosis Date Noted  . Chronic systolic CHF (congestive heart failure), NYHA class 2 (HCC) 12/27/2014  . Cerebrovascular accident (CVA) due to embolism of right middle cerebral artery (HCC) 11/24/2014  . 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 Chronic anticoagulation 11/24/2014  . Cardiomyopathy (HCC) 11/24/2014  . Chronic systolic heart failure (HCC) 10/10/2014  . Alcohol use disorder, mild, abuse 09/13/2014  . Cardiomyopathy, ischemic   . Atrial fibrillation (HCC) 09/07/2014  . Cerebral infarction due to embolism of right middle cerebral artery (HCC)   . Essential hypertension   . HLD (hyperlipidemia)   . Tobacco use disorder      Current Outpatient Medications on File Prior to Visit  Medication Sig Dispense Refill  . apixaban (ELIQUIS) 5 MG TABS tablet Take 1 tablet (5 mg total) by mouth 2 (two) times daily. 180 tablet 3  . carvedilol (COREG) 12.5 MG tablet Take 1 tablet (12.5 mg total) by mouth 2 (two) times daily. 60 tablet 11  . furosemide (LASIX) 40 MG tablet Take 0.5 tablets (20 mg total) by mouth daily. 30 tablet 11  . losartan (COZAAR) 100 MG tablet Take 1 tablet (100 mg total) by mouth daily.  90 tablet 3  . Multiple Vitamins-Minerals (MULTIVITAMIN & MINERAL PO) Take 1 tablet by mouth daily.    . simvastatin (ZOCOR) 20 MG tablet Take 1 tablet (20 mg total) by mouth daily. 30 tablet 11   No current facility-administered medications on file prior to visit.     No Known Allergies  Social History   Socioeconomic History  . Marital status: Widowed    Spouse name: Not on file  . Number of children: Not on file  . Years of education: Not on file  . Highest education level: Not on file  Occupational History  . Not on file  Social Needs  . Financial resource strain: Not on file  . Food insecurity:    Worry: Not on file    Inability: Not on file  . Transportation needs:    Medical: Not on file    Non-medical: Not on file  Tobacco Use  . Smoking status: Current Some Day Smoker    Packs/day: 0.50    Types: Cigarettes    Start date: 09/05/2014    Last attempt to quit: 09/05/2014    Years since quitting: 3.0  . Smokeless tobacco: Former Engineer, water and Sexual Activity  . Alcohol use: Yes    Alcohol/week: 0.0 standard drinks    Comment: rare  . Drug use: No  . Sexual activity: Not on file  Lifestyle  . Physical activity:  Days per week: Not on file    Minutes per session: Not on file  . Stress: Not on file  Relationships  . Social connections:    Talks on phone: Not on file    Gets together: Not on file    Attends religious service: Not on file    Active member of club or organization: Not on file    Attends meetings of clubs or organizations: Not on file    Relationship status: Not on file  . Intimate partner violence:    Fear of current or ex partner: Not on file    Emotionally abused: Not on file    Physically abused: Not on file    Forced sexual activity: Not on file  Other Topics Concern  . Not on file  Social History Narrative   Patient lives with his wife   His son or daughter-in-law will accompany him to appointments     Family History    Problem Relation Age of Onset  . Hypertension Mother   . Stroke Mother   . Stroke Maternal Grandfather   . Heart attack Neg Hx     Past Surgical History:  Procedure Laterality Date  . CARDIAC CATHETERIZATION N/A 10/10/2014   Procedure: Right/Left Heart Cath and Coronary Angiography;  Surgeon: Tonny Bollman, MD; normal coronary arteries, normal LVEDP, systemic hypertension   . REPAIR OF PERFORATED ULCER      ROS: Review of Systems Negative except as stated above PHYSICAL EXAM: BP (!) 129/100 (BP Location: Left Arm, Patient Position: Sitting, Cuff Size: Small)   Pulse 67   Temp (!) 97.5 F (36.4 C) (Oral)   Resp 16   Ht 5\' 7"  (1.702 m)   Wt 138 lb 6.4 oz (62.8 kg)   SpO2 99%   BMI 21.68 kg/m   Repeat BP 160/100 Physical Exam  General appearance - alert, well appearing, and in no distress Mental status - normal mood, behavior, speech, dress, motor activity, and thought processes Neck - supple, no significant adenopathy Chest - clear to auscultation, no wheezes, rales or rhonchi, symmetric air entry Heart -heart sounds are irregularly irregular but rate controlled Extremities - peripheral pulses normal, no pedal edema, no clubbing or cyanosis Neurologic -gait is steady.  Patient has a cane with him but does not use it and gait is steady without it.  Grip 5/5 bilaterally.  Power upper extremities and lower extremities 5/5 bilaterally.  He has intermittent difficulty at times getting words out  ASSESSMENT AND PLAN:  1. Essential hypertension Not at goal. Add amlodipine 5 mg daily.  Continue carvedilol and Cozaar. Appointment with clinical pharmacist in 1 week for recheck of blood pressure.  Goal is 130/80 or lower. - amLODipine (NORVASC) 5 MG tablet; Take 1 tablet (5 mg total) by mouth daily.  Dispense: 90 tablet; Refill: 3 - Basic Metabolic Panel  2. Chronic atrial fibrillation (HCC) Continue Eliquis and carvedilol.  3. NICM (nonischemic cardiomyopathy) (HCC) Continue  Cozaar, carvedilol and Eliquis  4. Tobacco dependence Patient advised to quit smoking. Discussed health risks associated with smoking including lung and other types of cancers, chronic lung diseases and CV risks. Pt ready to give trail of quitting.  Discussed methods to help quit including quitting cold [place], use of NRT, Chantix and Bupropion.  Patient wanting to try nicotine patches.  Prescription sent to the pharmacy for the 14 mg patch.  - nicotine (NICODERM CQ - DOSED IN MG/24 HOURS) 14 mg/24hr patch; Place 1 patch (14 mg total)  onto the skin daily.  Dispense: 28 patch; Refill: 1   Patient was given the opportunity to ask questions.  Patient verbalized understanding of the plan and was able to repeat key elements of the plan.   Orders Placed This Encounter  Procedures  . Basic Metabolic Panel     Requested Prescriptions   Signed Prescriptions Disp Refills  . amLODipine (NORVASC) 5 MG tablet 90 tablet 3    Sig: Take 1 tablet (5 mg total) by mouth daily.  . nicotine (NICODERM CQ - DOSED IN MG/24 HOURS) 14 mg/24hr patch 28 patch 1    Sig: Place 1 patch (14 mg total) onto the skin daily.    Return in about 3 months (around 12/30/2017).  Jonah Blue, MD, FACP

## 2017-09-30 LAB — BASIC METABOLIC PANEL
BUN / CREAT RATIO: 7 — AB (ref 10–24)
BUN: 12 mg/dL (ref 8–27)
CALCIUM: 9.5 mg/dL (ref 8.6–10.2)
CO2: 22 mmol/L (ref 20–29)
Chloride: 106 mmol/L (ref 96–106)
Creatinine, Ser: 1.66 mg/dL — ABNORMAL HIGH (ref 0.76–1.27)
GFR calc Af Amer: 50 mL/min/{1.73_m2} — ABNORMAL LOW (ref >59)
GFR calc non Af Amer: 43 mL/min/{1.73_m2} — ABNORMAL LOW (ref >59)
GLUCOSE: 97 mg/dL (ref 65–99)
Potassium: 3.4 mmol/L — ABNORMAL LOW (ref 3.5–5.2)
Sodium: 146 mmol/L — ABNORMAL HIGH (ref 134–144)

## 2017-10-01 ENCOUNTER — Telehealth: Payer: Self-pay

## 2017-10-01 ENCOUNTER — Other Ambulatory Visit: Payer: Self-pay | Admitting: Internal Medicine

## 2017-10-01 DIAGNOSIS — E876 Hypokalemia: Secondary | ICD-10-CM

## 2017-10-01 DIAGNOSIS — I5022 Chronic systolic (congestive) heart failure: Secondary | ICD-10-CM

## 2017-10-01 MED ORDER — FUROSEMIDE 40 MG PO TABS: ORAL_TABLET | ORAL | 11 refills | Status: DC

## 2017-10-01 MED ORDER — POTASSIUM CHLORIDE ER 8 MEQ PO TBCR
EXTENDED_RELEASE_TABLET | ORAL | 1 refills | Status: DC
Start: 2017-10-01 — End: 2017-12-30

## 2017-10-01 MED FILL — POTASSIUM CL ER 8 MEQ CAP: 8 | 30 days supply | Qty: 15 | Fill #0

## 2017-10-01 MED FILL — FUROSEMIDE 40 MG TAB: 40 | 28 days supply | Qty: 7 | Fill #0

## 2017-10-01 NOTE — Telephone Encounter (Signed)
Contacted pt to go over lab results pt is aware and doesn't have any questions or concerns 

## 2017-10-13 MED FILL — NICOTINE 14 MG/24HR PATCH: 14 | 28 days supply | Qty: 28 | Fill #0

## 2017-10-17 ENCOUNTER — Ambulatory Visit: Payer: Medicare Other | Attending: Family Medicine | Admitting: Pharmacist

## 2017-10-17 ENCOUNTER — Encounter: Payer: Self-pay | Admitting: Pharmacist

## 2017-10-17 DIAGNOSIS — Z79899 Other long term (current) drug therapy: Secondary | ICD-10-CM | POA: Diagnosis not present

## 2017-10-17 DIAGNOSIS — F17210 Nicotine dependence, cigarettes, uncomplicated: Secondary | ICD-10-CM | POA: Diagnosis not present

## 2017-10-17 DIAGNOSIS — I10 Essential (primary) hypertension: Secondary | ICD-10-CM | POA: Diagnosis not present

## 2017-10-17 MED ORDER — AMLODIPINE BESYLATE 10 MG PO TABS: 10.0000 mg | ORAL_TABLET | Freq: Every day | ORAL | 2 refills | Status: DC

## 2017-10-17 MED FILL — LOSARTAN POTASSIUM 100 MG T: 100 | 30 days supply | Qty: 30 | Fill #4

## 2017-10-17 MED FILL — SIMVASTATIN 20 MG TABLET: 20 | 30 days supply | Qty: 30 | Fill #4

## 2017-10-17 MED FILL — AMLODIPINE BESYLATE 10 MG T: 10 | 30 days supply | Qty: 30 | Fill #0

## 2017-10-17 MED FILL — CARVEDILOL 12.5 MG TABLET: 12.5 | 30 days supply | Qty: 60 | Fill #4

## 2017-10-17 NOTE — Patient Instructions (Signed)
Thank you for coming to see [place] today.   Blood pressure today is improving but still above goal.  Start amlodipine 10 mg daily. Continue other blood pressure medications   Limiting salt and caffeine, as well as exercising as able for at least 30 minutes for 5 days out of the week, can also help you lower your blood pressure.  Take your blood pressure at home if you are able. Please write down these numbers and bring them to your visits.  If you have any questions about medications, please call me [phone].  Franky Macho

## 2017-10-17 NOTE — Progress Notes (Signed)
   S:    Patient arrives in good spirits. Presents to the clinic for hypertension evaluation, counseling, and management. Patient was referred by Dr. Laural Benes on 09/29/17.   Denies CP, HA, SOB, or blurred vision  Patient reports adherence with medications.  Current BP Medications include:   - amlodipine 5 mg daily  - losartan 100 mg daily. - carvedilol 12.5 mg BID - Furosemide 40 mg: 1/2 tablet QOD  Dietary habits include:  - does not limit salt - caffeine: drinks tea daily Exercise habits include: - walks; 4-5 blocks daily Family / Social history:  - FH: HTN (mother) - Tobacco: some day smoker; reports < 4 cigarettes a day  Clinical ASCVD:  - previous stroke  Home BP readings:  - monitors BP but doesn't remember readings - reports "someone told me it was high"  O:  L arm after 5 minutes rest: 143/93; HR 69  Last 3 Office BP readings: BP Readings from Last 3 Encounters:  09/29/17 (!) 129/100  06/12/17 (!) 166/95  07/22/16 (!) 133/92   BMET    Component Value Date/Time   NA 146 (H) 09/29/2017 1129   K 3.4 (L) 09/29/2017 1129   CL 106 09/29/2017 1129   CO2 22 09/29/2017 1129   GLUCOSE 97 09/29/2017 1129   GLUCOSE 106 (H) 09/11/2015 1021   BUN 12 09/29/2017 1129   CREATININE 1.66 (H) 09/29/2017 1129   CREATININE 1.31 (H) 09/11/2015 1021   CALCIUM 9.5 09/29/2017 1129   GFRNONAA 43 (L) 09/29/2017 1129   GFRAA 50 (L) 09/29/2017 1129   Renal function: CrCl cannot be calculated (Unknown ideal weight.).  A/P: Hypertension longstanding currently uncontrolled on medications. BP Goal <130/80 mmHg. Patient is adherent with current medications.  -Increased dose of amlodipine 10 mg daily.  -Counseled on lifestyle modifications for blood pressure control including reduced dietary sodium, increased exercise, adequate sleep  Results reviewed and written information provided.   Total time in face-to-face counseling 15 minutes.    Patient seen with:  Mosie Lukes, PharmD  Candidate [employer] of 2021  Butch Penny, PharmD, CPP Clinical Pharmacist [facility] [phone]

## 2017-11-14 MED FILL — AMLODIPINE BESYLATE 10 MG T: 10 | 30 days supply | Qty: 30 | Fill #1

## 2017-11-14 MED FILL — LOSARTAN POTASSIUM 100 MG T: 100 | 30 days supply | Qty: 30 | Fill #5

## 2017-11-14 MED FILL — SIMVASTATIN 20 MG TABLET: 20 | 30 days supply | Qty: 30 | Fill #5

## 2017-11-14 MED FILL — FUROSEMIDE 40 MG TAB: 40 | 28 days supply | Qty: 7 | Fill #1

## 2017-11-25 ENCOUNTER — Encounter: Payer: Self-pay | Admitting: Internal Medicine

## 2017-11-25 ENCOUNTER — Ambulatory Visit: Payer: Medicare HMO | Attending: Internal Medicine | Admitting: Internal Medicine

## 2017-11-25 VITALS — BP 110/80 | HR 67 | Temp 97.5°F | Resp 16 | Wt 142.2 lb

## 2017-11-25 DIAGNOSIS — I5022 Chronic systolic (congestive) heart failure: Secondary | ICD-10-CM | POA: Insufficient documentation

## 2017-11-25 DIAGNOSIS — E785 Hyperlipidemia, unspecified: Secondary | ICD-10-CM | POA: Insufficient documentation

## 2017-11-25 DIAGNOSIS — I428 Other cardiomyopathies: Secondary | ICD-10-CM | POA: Diagnosis not present

## 2017-11-25 DIAGNOSIS — N289 Disorder of kidney and ureter, unspecified: Secondary | ICD-10-CM

## 2017-11-25 DIAGNOSIS — Z1211 Encounter for screening for malignant neoplasm of colon: Secondary | ICD-10-CM | POA: Diagnosis not present

## 2017-11-25 DIAGNOSIS — I4891 Unspecified atrial fibrillation: Secondary | ICD-10-CM | POA: Insufficient documentation

## 2017-11-25 DIAGNOSIS — E876 Hypokalemia: Secondary | ICD-10-CM | POA: Insufficient documentation

## 2017-11-25 DIAGNOSIS — I130 Hypertensive heart and chronic kidney disease with heart failure and stage 1 through stage 4 chronic kidney disease, or unspecified chronic kidney disease: Secondary | ICD-10-CM | POA: Insufficient documentation

## 2017-11-25 DIAGNOSIS — N183 Chronic kidney disease, stage 3 (moderate): Secondary | ICD-10-CM | POA: Insufficient documentation

## 2017-11-25 DIAGNOSIS — I10 Essential (primary) hypertension: Secondary | ICD-10-CM | POA: Diagnosis not present

## 2017-11-25 DIAGNOSIS — Z79899 Other long term (current) drug therapy: Secondary | ICD-10-CM | POA: Insufficient documentation

## 2017-11-25 DIAGNOSIS — Z23 Encounter for immunization: Secondary | ICD-10-CM | POA: Insufficient documentation

## 2017-11-25 DIAGNOSIS — F17210 Nicotine dependence, cigarettes, uncomplicated: Secondary | ICD-10-CM | POA: Insufficient documentation

## 2017-11-25 DIAGNOSIS — Z7901 Long term (current) use of anticoagulants: Secondary | ICD-10-CM | POA: Insufficient documentation

## 2017-11-25 DIAGNOSIS — F1010 Alcohol abuse, uncomplicated: Secondary | ICD-10-CM | POA: Insufficient documentation

## 2017-11-25 DIAGNOSIS — Z8673 Personal history of transient ischemic attack (TIA), and cerebral infarction without residual deficits: Secondary | ICD-10-CM | POA: Insufficient documentation

## 2017-11-25 MED ORDER — PNEUMOCOCCAL 13-VAL CONJ VACC IM SUSP: 0.5000 mL | INTRAMUSCULAR | 0 refills | Status: AC

## 2017-11-25 NOTE — Progress Notes (Signed)
Pt states he has stopped taking the K rx that was prescribed

## 2017-11-25 NOTE — Progress Notes (Signed)
Patient ID: Jeffrey Martin, male    DOB: [birth year]  MRN: [id number]  CC: Follow-up   Subjective: Jeffrey Martin is a 67 y.o. male who presents for chronic disease management. His concerns today include:  Patient with history of HTN, HL, A. 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  He saw clinical pharmacist since then and dose was increased to 10 mg daily.  He reports compliance with blood pressure medications. Checks BP once a wk at [facility].  He does not recall range No CP/SOB K+ level was low on blood test done on last visit.  K+ supplement added to take every other day with furosemide.  Patient states that he took it only about 4 times and then discontinued taking it because one time he took it he felt like his balance was off. -Also noted to have renal insufficiency in the range of CKD stage III.  He is not on any NSAIDs.  NICM/a.fib: Patient denies any chest pain, shortness of breath, palpitations no lower extremity edema.  Has not seen Dr. Antoine Poche in over a year.  No bruising or bleeding on Eliquis..   Patient Active Problem List   Diagnosis Date Noted  . Chronic systolic CHF (congestive heart failure), NYHA class 2 (HCC) 12/27/2014  . Cerebrovascular accident (CVA) due to embolism of right middle cerebral artery (HCC) 11/24/2014  . 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 Chronic anticoagulation 11/24/2014  . Cardiomyopathy (HCC) 11/24/2014  . Chronic systolic heart failure (HCC) 10/10/2014  . Alcohol use disorder, mild, abuse 09/13/2014  . Cardiomyopathy, ischemic   . Atrial fibrillation (HCC) 09/07/2014  . Essential hypertension   . HLD (hyperlipidemia)   . Tobacco use disorder      Current Outpatient Medications on File Prior to Visit  Medication Sig Dispense Refill  . amLODipine (NORVASC) 10 MG tablet Take 1 tablet (10  mg total) by mouth daily. 30 tablet 2  . apixaban (ELIQUIS) 5 MG TABS tablet Take 1 tablet (5 mg total) by mouth 2 (two) times daily. 180 tablet 3  . carvedilol (COREG) 12.5 MG tablet Take 1 tablet (12.5 mg total) by mouth 2 (two) times daily. 60 tablet 11  . furosemide (LASIX) 40 MG tablet 1/2 tab PO every other day PO 30 tablet 11  . losartan (COZAAR) 100 MG tablet Take 1 tablet (100 mg total) by mouth daily. 90 tablet 3  . Multiple Vitamins-Minerals (MULTIVITAMIN & MINERAL PO) Take 1 tablet by mouth daily.    . nicotine (NICODERM CQ - DOSED IN MG/24 HOURS) 14 mg/24hr patch Place 1 patch (14 mg total) onto the skin daily. 28 patch 1  . potassium chloride (KLOR-CON) 8 MEQ tablet 1 tab PO every other day with Furosemide. (Patient not taking: Reported on 11/25/2017) 30 tablet 1  . simvastatin (ZOCOR) 20 MG tablet Take 1 tablet (20 mg total) by mouth daily. 30 tablet 11   No current facility-administered medications on file prior to visit.     No Known Allergies  Social History   Socioeconomic History  . Marital status: Widowed    Spouse name: Not on file  . Number of children: Not on file  . Years of education: Not on file  . Highest education level: Not on file  Occupational History  . Not on file  Social Needs  .  Financial resource strain: Not on file  . Food insecurity:    Worry: Not on file    Inability: Not on file  . Transportation needs:    Medical: Not on file    Non-medical: Not on file  Tobacco Use  . Smoking status: Current Some Day Smoker    Packs/day: 0.50    Types: Cigarettes    Start date: 09/05/2014    Last attempt to quit: 09/05/2014    Years since quitting: 3.2  . Smokeless tobacco: Former Engineer, water and Sexual Activity  . Alcohol use: Yes    Alcohol/week: 0.0 standard drinks    Comment: rare  . Drug use: No  . Sexual activity: Not on file  Lifestyle  . Physical activity:    Days per week: Not on file    Minutes per session: Not on file  . Stress:  Not on file  Relationships  . Social connections:    Talks on phone: Not on file    Gets together: Not on file    Attends religious service: Not on file    Active member of club or organization: Not on file    Attends meetings of clubs or organizations: Not on file    Relationship status: Not on file  . Intimate partner violence:    Fear of current or ex partner: Not on file    Emotionally abused: Not on file    Physically abused: Not on file    Forced sexual activity: Not on file  Other Topics Concern  . Not on file  Social History Narrative   Patient lives with his wife   His son or daughter-in-law will accompany him to appointments     Family History  Problem Relation Age of Onset  . Hypertension Mother   . Stroke Mother   . Stroke Maternal Grandfather   . Heart attack Neg Hx     Past Surgical History:  Procedure Laterality Date  . CARDIAC CATHETERIZATION N/A 10/10/2014   Procedure: Right/Left Heart Cath and Coronary Angiography;  Surgeon: Tonny Bollman, MD; normal coronary arteries, normal LVEDP, systemic hypertension   . REPAIR OF PERFORATED ULCER      ROS: Review of Systems Negative except as stated above PHYSICAL EXAM: BP 117/90   Pulse 67   Temp (!) 97.5 F (36.4 C) (Oral)   Resp 16   Wt 142 lb 3.2 oz (64.5 kg)   SpO2 100%   BMI 22.27 kg/m   Physical Exam Repeat BP 110/80 General appearance - alert, well appearing, and in no distress Mental status - normal mood, behavior, speech, dress, motor activity, and thought processes Chest - clear to auscultation, no wheezes, rales or rhonchi, symmetric air entry Heart -heart rate is irregularly irregular but rate controlled.   Extremities -no lower extremity edema  Results for orders placed or performed in visit on 09/29/17  Basic Metabolic Panel  Result Value Ref Range   Glucose 97 65 - 99 mg/dL   BUN 12 8 - 27 mg/dL   Creatinine, Ser 1.61 (H) 0.76 - 1.27 mg/dL   GFR calc non Af Amer 43 (L) >59  mL/min/1.73   GFR calc Af Amer 50 (L) >59 mL/min/1.73   BUN/Creatinine Ratio 7 (L) 10 - 24   Sodium 146 (H) 134 - 144 mmol/L   Potassium 3.4 (L) 3.5 - 5.2 mmol/L   Chloride 106 96 - 106 mmol/L   CO2 22 20 - 29 mmol/L   Calcium 9.5 8.6 -  10.2 mg/dL    ASSESSMENT AND PLAN: 1. Essential hypertension At goal. Continue current medications.  Continue to encourage low-salt diet.  2. NICM (nonischemic cardiomyopathy) (HCC) We will get him back in with his cardiologist.  Last echo was in 2017. - Ambulatory referral to Cardiology  3. Renal insufficiency Recheck BMP today.  Advised patient to avoid taking NSAIDs.  4. Colon cancer screening Discussed colon cancer screening and methods to do so.  Patient wanted to do the fit test instead of colonoscopy. - Fecal occult blood, imunochemical([facility])  5. Need for vaccination against Streptococcus pneumoniae using pneumococcal conjugate vaccine 13 Given today.  He also needs a flu vaccine but we have ran out of it.  He will come back next week as a nurse only visit for  6. Hypokalemia I doubt the potassium supplement caused the feeling of disequilibrium.  We will recheck potassium again today.  If is still low I told him that I would like for him to try taking the supplement again just on the days when he takes furosemide - Basic Metabolic Panel  7. Chronic systolic heart failure (HCC) Compensated. - Basic Metabolic Panel   Patient was given the opportunity to ask questions.  Patient verbalized understanding of the plan and was able to repeat key elements of the plan.   No orders of the defined types were placed in this encounter.    Requested Prescriptions    No prescriptions requested or ordered in this encounter    No follow-ups on file.  Jonah Blue, MD, FACP

## 2017-11-25 NOTE — Patient Instructions (Signed)
You can schedule an nurse visit for the flu shot or you can come by the office on Tuesdays 8:30 am-12:15 pm or Thursdays 1:30 pm -5 pm for our free flu clinic

## 2017-11-26 ENCOUNTER — Telehealth: Payer: Self-pay

## 2017-11-26 LAB — BASIC METABOLIC PANEL
BUN / CREAT RATIO: 12 (ref 10–24)
BUN: 14 mg/dL (ref 8–27)
CALCIUM: 9 mg/dL (ref 8.6–10.2)
CHLORIDE: 105 mmol/L (ref 96–106)
CO2: 25 mmol/L (ref 20–29)
Creatinine, Ser: 1.17 mg/dL (ref 0.76–1.27)
GFR calc non Af Amer: 65 mL/min/{1.73_m2} (ref >59)
GFR, EST AFRICAN AMERICAN: 75 mL/min/{1.73_m2} (ref >59)
Glucose: 79 mg/dL (ref 65–99)
POTASSIUM: 3.9 mmol/L (ref 3.5–5.2)
SODIUM: 145 mmol/L — AB (ref 134–144)

## 2017-11-26 NOTE — Telephone Encounter (Signed)
Contacted pt to go over lab results pt is aware and doesn't have any questions or concerns 

## 2017-12-15 MED FILL — CARVEDILOL 12.5 MG TABLET: 12.5 | 30 days supply | Qty: 60 | Fill #5

## 2017-12-15 MED FILL — LOSARTAN POTASSIUM 100 MG T: 100 | 30 days supply | Qty: 30 | Fill #6

## 2017-12-15 MED FILL — AMLODIPINE BESYLATE 10 MG T: 10 | 30 days supply | Qty: 30 | Fill #2

## 2017-12-15 MED FILL — SIMVASTATIN 20 MG TABLET: 20 | 30 days supply | Qty: 30 | Fill #6

## 2017-12-15 MED FILL — FUROSEMIDE 40 MG TAB: 40 | 28 days supply | Qty: 7 | Fill #2

## 2017-12-30 ENCOUNTER — Encounter: Payer: Self-pay | Admitting: Internal Medicine

## 2017-12-30 ENCOUNTER — Ambulatory Visit: Payer: Medicare (Managed Care) | Attending: Internal Medicine | Admitting: Internal Medicine

## 2017-12-30 VITALS — BP 120/90 | HR 65 | Temp 97.7°F | Resp 16 | Wt 150.8 lb

## 2017-12-30 DIAGNOSIS — I110 Hypertensive heart disease with heart failure: Secondary | ICD-10-CM | POA: Insufficient documentation

## 2017-12-30 DIAGNOSIS — Z7901 Long term (current) use of anticoagulants: Secondary | ICD-10-CM | POA: Insufficient documentation

## 2017-12-30 DIAGNOSIS — R600 Localized edema: Secondary | ICD-10-CM | POA: Diagnosis not present

## 2017-12-30 DIAGNOSIS — Z8249 Family history of ischemic heart disease and other diseases of the circulatory system: Secondary | ICD-10-CM | POA: Insufficient documentation

## 2017-12-30 DIAGNOSIS — Z823 Family history of stroke: Secondary | ICD-10-CM | POA: Insufficient documentation

## 2017-12-30 DIAGNOSIS — F17210 Nicotine dependence, cigarettes, uncomplicated: Secondary | ICD-10-CM | POA: Insufficient documentation

## 2017-12-30 DIAGNOSIS — I10 Essential (primary) hypertension: Secondary | ICD-10-CM

## 2017-12-30 DIAGNOSIS — F17200 Nicotine dependence, unspecified, uncomplicated: Secondary | ICD-10-CM | POA: Diagnosis not present

## 2017-12-30 DIAGNOSIS — E785 Hyperlipidemia, unspecified: Secondary | ICD-10-CM | POA: Insufficient documentation

## 2017-12-30 DIAGNOSIS — Z2821 Immunization not carried out because of patient refusal: Secondary | ICD-10-CM

## 2017-12-30 DIAGNOSIS — Z23 Encounter for immunization: Secondary | ICD-10-CM

## 2017-12-30 DIAGNOSIS — I5022 Chronic systolic (congestive) heart failure: Secondary | ICD-10-CM | POA: Insufficient documentation

## 2017-12-30 DIAGNOSIS — Z79899 Other long term (current) drug therapy: Secondary | ICD-10-CM | POA: Insufficient documentation

## 2017-12-30 DIAGNOSIS — Z8673 Personal history of transient ischemic attack (TIA), and cerebral infarction without residual deficits: Secondary | ICD-10-CM | POA: Insufficient documentation

## 2017-12-30 MED ORDER — PNEUMOCOCCAL 13-VAL CONJ VACC IM SUSP: 0.5000 mL | INTRAMUSCULAR | 0 refills | Status: AC

## 2017-12-30 MED FILL — PREVNAR 13 SYRINGE: 1 days supply | Qty: 1 | Fill #0

## 2017-12-30 NOTE — Patient Instructions (Addendum)
Increase furosemide to half a tablet every day. Try to limit salt in the foods.   Dr. Fayrene Fearing Hochrein  [phone]   Pneumococcal Conjugate Vaccine (PCV13) What You Need to Know 1. Why get vaccinated? Vaccination can protect both children and adults from pneumococcal disease. Pneumococcal disease is caused by bacteria that can spread from person to person through close contact. It can cause ear infections, and it can also lead to more serious infections of the:  Lungs (pneumonia),  Blood (bacteremia), and  Covering of the brain and spinal cord (meningitis).  Pneumococcal pneumonia is most common among adults. Pneumococcal meningitis can cause deafness and brain damage, and it kills about 1 child in 10 who get it. Anyone can get pneumococcal disease, but children under 20 years of age and adults 96 years and older, people with certain medical conditions, and cigarette smokers are at the highest risk. Before there was a vaccine, the [place] saw:  more than 700 cases of meningitis,  about 13,000 blood infections,  about 5 million ear infections, and  about 200 deaths  in children under 5 each year from pneumococcal disease. Since vaccine became available, severe pneumococcal disease in these children has fallen by 88%. About 18,000 older adults die of pneumococcal disease each year in the [place]. Treatment of pneumococcal infections with penicillin and other drugs is not as effective as it used to be, because some strains of the disease have become resistant to these drugs. This makes prevention of the disease, through vaccination, even more important. 2. PCV13 vaccine Pneumococcal conjugate vaccine (called PCV13) protects against 13 types of pneumococcal bacteria. PCV13 is routinely given to children at 2, 4, 6, and 38-84 months of age. It is also recommended for children and adults 39 to 9 years of age with certain health conditions, and for all adults 42 years of age  and older. Your doctor can give you details. 3. Some people should not get this vaccine Anyone who has ever had a life-threatening allergic reaction to a dose of this vaccine, to an earlier pneumococcal vaccine called PCV7, or to any vaccine containing diphtheria toxoid (for example, DTaP), should not get PCV13. Anyone with a severe allergy to any component of PCV13 should not get the vaccine. Tell your doctor if the person being vaccinated has any severe allergies. If the person scheduled for vaccination is not feeling well, your healthcare provider might decide to reschedule the shot on another day. 4. Risks of a vaccine reaction With any medicine, including vaccines, there is a chance of reactions. These are usually mild and go away on their own, but serious reactions are also possible. Problems reported following PCV13 varied by age and dose in the series. The most common problems reported among children were:  About half became drowsy after the shot, had a temporary loss of appetite, or had redness or tenderness where the shot was given.  About 1 out of 3 had swelling where the shot was given.  About 1 out of 3 had a mild fever, and about 1 in 20 had a fever over 102.54F.  Up to about 8 out of 10 became fussy or irritable.  Adults have reported pain, redness, and swelling where the shot was given; also mild fever, fatigue, headache, chills, or muscle pain. Young children who get PCV13 along with inactivated flu vaccine at the same time may be at increased risk for seizures caused by fever. Ask your doctor for more information. Problems that could  happen after any vaccine:  People sometimes faint after a medical procedure, including vaccination. Sitting or lying down for about 15 minutes can help prevent fainting, and injuries caused by a fall. Tell your doctor if you feel dizzy, or have vision changes or ringing in the ears.  Some older children and adults get severe pain in the shoulder  and have difficulty moving the arm where a shot was given. This happens very rarely.  Any medication can cause a severe allergic reaction. Such reactions from a vaccine are very rare, estimated at about 1 in a million doses, and would happen within a few minutes to a few hours after the vaccination. As with any medicine, there is a very small chance of a vaccine causing a serious injury or death. The safety of vaccines is always being monitored. For more information, visit: http://floyd.org/ 5. What if there is a serious reaction? What should I look for? Look for anything that concerns you, such as signs of a severe allergic reaction, very high fever, or unusual behavior. Signs of a severe allergic reaction can include hives, swelling of the face and throat, difficulty breathing, a fast heartbeat, dizziness, and weakness-usually within a few minutes to a few hours after the vaccination. What should I do?  If you think it is a severe allergic reaction or other emergency that can't wait, call 9-1-1 or get the person to the nearest hospital. Otherwise, call your doctor.  Reactions should be reported to the Vaccine Adverse Event Reporting System (VAERS). Your doctor should file this report, or you can do it yourself through the VAERS web site at www.vaers.LAgents.no, or by calling 1-(317) 793-3071. ? VAERS does not give medical advice. 6. The National Vaccine Injury Compensation Program The [employer] Vaccine Injury Compensation Program (VICP) is a federal program that was created to compensate people who may have been injured by certain vaccines. Persons who believe they may have been injured by a vaccine can learn about the program and about filing a claim by calling 1-(360) 027-5635 or visiting the VICP website at SpiritualWord.at. There is a time limit to file a claim for compensation. 7. How can I learn more?  Ask your healthcare provider. He or she can give you the vaccine  package insert or suggest other sources of information.  Call your local or state health department.  Contact the Centers for Disease Control and Prevention (CDC): ? Call 208-389-9737 (1-800-CDC-INFO) or ? Visit CDC's website at PicCapture.uy Vaccine Information Statement, PCV13 Vaccine (12/23/2013) This information is not intended to replace advice given to you by your health care provider. Make sure you discuss any questions you have with your health care provider. Document Released: 12/02/2005 Document Revised: 10/26/2015 Document Reviewed: 10/26/2015 Elsevier Interactive Patient Education  2017 [employer].

## 2017-12-30 NOTE — Progress Notes (Signed)
Patient ID: Jeffrey Martin, male    DOB: 1951-01-01  MRN: [id number]  CC: Hypertension   Subjective: Jeffrey Martin is a 67 y.o. male who presents for HTN.  I saw him one month ago. His concerns today include:  Patient with history of HTN, HL, A. 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  Endorses lower extremity edema.  Reports compliance with medications.   Referred for follow-up appointment with his cardiologist on last visit  For hx of NICM.  He was called they were unable to leave a message as patient has a cell phone with a voicemail box that is not set up.  We will give him the phone number today so that he can call and schedule the appointment.  Tob dep:  Smoking 10 cig a wk. Trying to quit.  Attributes stress as factor for his continuing to smoke.  Has nicotine patches but not using consistently  HM:  He did not get the Prevar 13 on last visit as indicated in my last note.  He will get it today.  Declines flu shot Patient Active Problem List   Diagnosis Date Noted  . Chronic systolic CHF (congestive heart failure), NYHA class 2 (HCC) 12/27/2014  . Cerebrovascular accident (CVA) due to embolism of right middle cerebral artery (HCC) 11/24/2014  . 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 Chronic anticoagulation 11/24/2014  . Cardiomyopathy (HCC) 11/24/2014  . Chronic systolic heart failure (HCC) 10/10/2014  . Alcohol use disorder, mild, abuse 09/13/2014  . Cardiomyopathy, ischemic   . Atrial fibrillation (HCC) 09/07/2014  . Essential hypertension   . HLD (hyperlipidemia)   . Tobacco use disorder      Current Outpatient Medications on File Prior to Visit  Medication Sig Dispense Refill  . amLODipine (NORVASC) 10 MG tablet Take 1 tablet (10 mg total) by mouth daily. 30 tablet 2  .  apixaban (ELIQUIS) 5 MG TABS tablet Take 1 tablet (5 mg total) by mouth 2 (two) times daily. 180 tablet 3  . carvedilol (COREG) 12.5 MG tablet Take 1 tablet (12.5 mg total) by mouth 2 (two) times daily. 60 tablet 11  . furosemide (LASIX) 40 MG tablet 1/2 tab PO every other day PO 30 tablet 11  . losartan (COZAAR) 100 MG tablet Take 1 tablet (100 mg total) by mouth daily. 90 tablet 3  . Multiple Vitamins-Minerals (MULTIVITAMIN & MINERAL PO) Take 1 tablet by mouth daily.    . nicotine (NICODERM CQ - DOSED IN MG/24 HOURS) 14 mg/24hr patch Place 1 patch (14 mg total) onto the skin daily. 28 patch 1  . simvastatin (ZOCOR) 20 MG tablet Take 1 tablet (20 mg total) by mouth daily. 30 tablet 11   No current facility-administered medications on file prior to visit.     No Known Allergies  Social History   Socioeconomic History  . Marital status: Widowed    Spouse name: Not on file  . Number of children: Not on file  . Years of education: Not on file  . Highest education level: Not on file  Occupational History  . Not on file  Social Needs  . Financial resource strain: Not on file  . Food insecurity:    Worry: Not on file    Inability: Not on file  .  Transportation needs:    Medical: Not on file    Non-medical: Not on file  Tobacco Use  . Smoking status: Current Some Day Smoker    Packs/day: 0.50    Types: Cigarettes    Start date: 09/05/2014    Last attempt to quit: 09/05/2014    Years since quitting: 3.3  . Smokeless tobacco: Former Engineer, water and Sexual Activity  . Alcohol use: Yes    Alcohol/week: 0.0 standard drinks    Comment: rare  . Drug use: No  . Sexual activity: Not on file  Lifestyle  . Physical activity:    Days per week: Not on file    Minutes per session: Not on file  . Stress: Not on file  Relationships  . Social connections:    Talks on phone: Not on file    Gets together: Not on file    Attends religious service: Not on file    Active member of club or  organization: Not on file    Attends meetings of clubs or organizations: Not on file    Relationship status: Not on file  . Intimate partner violence:    Fear of current or ex partner: Not on file    Emotionally abused: Not on file    Physically abused: Not on file    Forced sexual activity: Not on file  Other Topics Concern  . Not on file  Social History Narrative   Patient lives with his wife   His son or daughter-in-law will accompany him to appointments     Family History  Problem Relation Age of Onset  . Hypertension Mother   . Stroke Mother   . Stroke Maternal Grandfather   . Heart attack Neg Hx     Past Surgical History:  Procedure Laterality Date  . CARDIAC CATHETERIZATION N/A 10/10/2014   Procedure: Right/Left Heart Cath and Coronary Angiography;  Surgeon: Tonny Bollman, MD; normal coronary arteries, normal LVEDP, systemic hypertension   . REPAIR OF PERFORATED ULCER      ROS: Review of Systems Negative except as above PHYSICAL EXAM: BP (!) 132/95   Pulse (!) 57   Temp 97.7 F (36.5 C) (Oral)   Resp 16   Wt 150 lb 12.8 oz (68.4 kg)   SpO2 99%   BMI 23.62 kg/m   120/90, P 65 Wt Readings from Last 3 Encounters:  12/30/17 150 lb 12.8 oz (68.4 kg)  11/25/17 142 lb 3.2 oz (64.5 kg)  09/29/17 138 lb 6.4 oz (62.8 kg)    Physical Exam  General appearance - alert, well appearing, and in no distress Mental status - normal mood, behavior, speech, dress, motor activity, and thought processes Chest - clear to auscultation, no wheezes, rales or rhonchi, symmetric air entry Heart - normal rate, regular rhythm, normal S1, S2, no murmurs, rubs, clicks or gallops Extremities -1+ bilateral lower extremity edema left greater than right  Results for orders placed or performed in visit on 11/25/17  Basic Metabolic Panel  Result Value Ref Range   Glucose 79 65 - 99 mg/dL   BUN 14 8 - 27 mg/dL   Creatinine, Ser 4.09 0.76 - 1.27 mg/dL   GFR calc non Af Amer 65 >59  mL/min/1.73   GFR calc Af Amer 75 >59 mL/min/1.73   BUN/Creatinine Ratio 12 10 - 24   Sodium 145 (H) 134 - 144 mmol/L   Potassium 3.9 3.5 - 5.2 mmol/L   Chloride 105 96 - 106 mmol/L  CO2 25 20 - 29 mmol/L   Calcium 9.0 8.6 - 10.2 mg/dL    ASSESSMENT AND PLAN: 1. Essential hypertension Diastolic blood pressure not at goal today. He does have some lower extremity edema with increased weight. Encourage him to restrict salt as much as possible. Increase furosemide to 20 mg every day instead of every other day. Patient given the phone number for his cardiology office to call and schedule his appointment.  2. Bilateral leg edema See #1 above  3. Tobacco dependence Encourage him to use the nicotine patches consistently  4. Need for vaccination against Streptococcus pneumoniae using pneumococcal conjugate vaccine 13   5. Influenza vaccination declined   Will f/u in 1 mth for repeat BP check and check on LE edema Patient was given the opportunity to ask questions.  Patient verbalized understanding of the plan and was able to repeat key elements of the plan.   No orders of the defined types were placed in this encounter.    Requested Prescriptions   Signed Prescriptions Disp Refills  . pneumococcal 13-valent conjugate vaccine (PREVNAR 13) SUSP injection 0.5 mL 0    Sig: Inject 0.5 mLs into the muscle tomorrow at 10 am for 1 dose.    Return in about 1 month (around 01/29/2018) for repeat BP check.  Jonah Blue, MD, FACP

## 2018-01-05 ENCOUNTER — Telehealth: Payer: Self-pay

## 2018-01-05 ENCOUNTER — Encounter: Payer: Self-pay | Admitting: Pediatric Intensive Care

## 2018-01-05 DIAGNOSIS — I5022 Chronic systolic (congestive) heart failure: Secondary | ICD-10-CM

## 2018-01-05 MED ORDER — FUROSEMIDE 40 MG PO TABS: ORAL_TABLET | ORAL | 11 refills | Status: DC

## 2018-01-05 MED FILL — FUROSEMIDE 40 MG TAB: 40 | 30 days supply | Qty: 30 | Fill #0

## 2018-01-05 NOTE — Telephone Encounter (Signed)
New rxn for furosemide sent to pt's pharmacy to reflect increase dose.

## 2018-01-05 NOTE — Telephone Encounter (Signed)
As per Jenell Milliner, [facility] Pharmacy Tech, the patient's furosemide will be ready by the end of the day.  Message sent to [facility] and informed her that the furosemide would be ready for pick up by the end of the day.

## 2018-01-05 NOTE — Congregational Nurse Program (Signed)
New client encounter. Client states he needs his BP checked. States that he is a client at [facility] and [employer]. He was instructed to increase his lasix dose to 20mg  daily with his last PCP visit. He states that he's out of medication now. Has +1edema over his shins bilaterally. CN will call [facility] to arrange pick up of new lasix dose.

## 2018-01-05 NOTE — Telephone Encounter (Signed)
As per Shann Medal, RN/[facility] the patient is in need of a refill of his furosemide as the order was changed to 20 mg daily instead of every other day.  Per [facility] pharmacy it is too early for refill of the order that is on file. A  prescription is needed to reflect a new order.

## 2018-01-21 ENCOUNTER — Other Ambulatory Visit: Payer: Self-pay | Admitting: Internal Medicine

## 2018-01-21 MED FILL — LOSARTAN POTASSIUM 100 MG T: 100 | 30 days supply | Qty: 30 | Fill #7

## 2018-01-21 MED FILL — SIMVASTATIN 20 MG TABLET: 20 | 30 days supply | Qty: 30 | Fill #7

## 2018-01-22 MED FILL — AMLODIPINE BESYLATE 10 MG T: 10 | 30 days supply | Qty: 30 | Fill #0

## 2018-01-29 ENCOUNTER — Encounter: Payer: Self-pay | Admitting: Internal Medicine

## 2018-01-29 ENCOUNTER — Ambulatory Visit: Payer: Medicare HMO | Attending: Internal Medicine | Admitting: Internal Medicine

## 2018-01-29 VITALS — BP 128/94 | HR 71 | Temp 97.6°F | Resp 16 | Wt 160.4 lb

## 2018-01-29 DIAGNOSIS — M25532 Pain in left wrist: Secondary | ICD-10-CM | POA: Insufficient documentation

## 2018-01-29 DIAGNOSIS — E785 Hyperlipidemia, unspecified: Secondary | ICD-10-CM | POA: Insufficient documentation

## 2018-01-29 DIAGNOSIS — I69354 Hemiplegia and hemiparesis following cerebral infarction affecting left non-dominant side: Secondary | ICD-10-CM | POA: Insufficient documentation

## 2018-01-29 DIAGNOSIS — I428 Other cardiomyopathies: Secondary | ICD-10-CM | POA: Insufficient documentation

## 2018-01-29 DIAGNOSIS — I4891 Unspecified atrial fibrillation: Secondary | ICD-10-CM | POA: Insufficient documentation

## 2018-01-29 DIAGNOSIS — I110 Hypertensive heart disease with heart failure: Secondary | ICD-10-CM | POA: Insufficient documentation

## 2018-01-29 DIAGNOSIS — R600 Localized edema: Secondary | ICD-10-CM | POA: Diagnosis not present

## 2018-01-29 DIAGNOSIS — F17210 Nicotine dependence, cigarettes, uncomplicated: Secondary | ICD-10-CM | POA: Insufficient documentation

## 2018-01-29 DIAGNOSIS — I10 Essential (primary) hypertension: Secondary | ICD-10-CM | POA: Diagnosis not present

## 2018-01-29 DIAGNOSIS — Z8711 Personal history of peptic ulcer disease: Secondary | ICD-10-CM | POA: Insufficient documentation

## 2018-01-29 DIAGNOSIS — I69328 Other speech and language deficits following cerebral infarction: Secondary | ICD-10-CM | POA: Insufficient documentation

## 2018-01-29 DIAGNOSIS — Z8249 Family history of ischemic heart disease and other diseases of the circulatory system: Secondary | ICD-10-CM | POA: Insufficient documentation

## 2018-01-29 DIAGNOSIS — I5022 Chronic systolic (congestive) heart failure: Secondary | ICD-10-CM | POA: Insufficient documentation

## 2018-01-29 DIAGNOSIS — Z79899 Other long term (current) drug therapy: Secondary | ICD-10-CM | POA: Insufficient documentation

## 2018-01-29 DIAGNOSIS — Z7901 Long term (current) use of anticoagulants: Secondary | ICD-10-CM | POA: Insufficient documentation

## 2018-01-29 NOTE — Progress Notes (Signed)
Patient ID: Jeffrey Martin, male    DOB: 1950-11-10  MRN: [id number]  CC: Hypertension   Subjective: Jeffrey Martin is a 67 y.o. male who presents for one-month follow-up on blood pressure.   His concerns today include:  Patient with history of HTN, HL, A. 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  We changed her furosemide to 20 mg daily instead of every other day.  Since then patient reports compliance with medications.  Lower extremity edema has resolved.  He limit salt in the foods.  No device to check blood pressure.  Complains of soreness in the left wrist that started today.  He notices it only when he pushes up with his hands to get up from a chair.  He has not noticed any swelling.  No erythema.  No known injury. Patient Active Problem List   Diagnosis Date Noted  . Chronic systolic CHF (congestive heart failure), NYHA class 2 (HCC) 12/27/2014  . Cerebrovascular accident (CVA) due to embolism of right middle cerebral artery (HCC) 11/24/2014  . 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 Chronic anticoagulation 11/24/2014  . Cardiomyopathy (HCC) 11/24/2014  . Chronic systolic heart failure (HCC) 10/10/2014  . Alcohol use disorder, mild, abuse 09/13/2014  . Cardiomyopathy, ischemic   . Atrial fibrillation (HCC) 09/07/2014  . Essential hypertension   . HLD (hyperlipidemia)   . Tobacco use disorder      Current Outpatient Medications on File Prior to Visit  Medication Sig Dispense Refill  . amLODipine (NORVASC) 10 MG tablet TAKE 1 TABLET (10 MG TOTAL) BY MOUTH DAILY. 30 tablet 2  . apixaban (ELIQUIS) 5 MG TABS tablet Take 1 tablet (5 mg total) by mouth 2 (two) times daily. 180 tablet 3  . carvedilol (COREG) 12.5 MG tablet Take 1 tablet (12.5 mg total) by mouth 2 (two) times daily. 60  tablet 11  . furosemide (LASIX) 40 MG tablet 1/2 tab PO every day PO 30 tablet 11  . losartan (COZAAR) 100 MG tablet Take 1 tablet (100 mg total) by mouth daily. 90 tablet 3  . Multiple Vitamins-Minerals (MULTIVITAMIN & MINERAL PO) Take 1 tablet by mouth daily.    . nicotine (NICODERM CQ - DOSED IN MG/24 HOURS) 14 mg/24hr patch Place 1 patch (14 mg total) onto the skin daily. 28 patch 1  . simvastatin (ZOCOR) 20 MG tablet Take 1 tablet (20 mg total) by mouth daily. 30 tablet 11   No current facility-administered medications on file prior to visit.     No Known Allergies  Social History   Socioeconomic History  . Marital status: Widowed    Spouse name: Not on file  . Number of children: Not on file  . Years of education: Not on file  . Highest education level: Not on file  Occupational History  . Not on file  Social Needs  . Financial resource strain: Not on file  . Food insecurity:    Worry: Not on file    Inability: Not on file  . Transportation needs:    Medical: Not on file    Non-medical: Not on file  Tobacco Use  . Smoking status: Current Some Day Smoker    Packs/day: 0.50    Types: Cigarettes    Start date: 09/05/2014    Last attempt to  quit: 09/05/2014    Years since quitting: 3.4  . Smokeless tobacco: Former Engineer, water and Sexual Activity  . Alcohol use: Yes    Alcohol/week: 0.0 standard drinks    Comment: rare  . Drug use: No  . Sexual activity: Not on file  Lifestyle  . Physical activity:    Days per week: Not on file    Minutes per session: Not on file  . Stress: Not on file  Relationships  . Social connections:    Talks on phone: Not on file    Gets together: Not on file    Attends religious service: Not on file    Active member of club or organization: Not on file    Attends meetings of clubs or organizations: Not on file    Relationship status: Not on file  . Intimate partner violence:    Fear of current or ex partner: Not on file     Emotionally abused: Not on file    Physically abused: Not on file    Forced sexual activity: Not on file  Other Topics Concern  . Not on file  Social History Narrative   Patient lives with his wife   His son or daughter-in-law will accompany him to appointments     Family History  Problem Relation Age of Onset  . Hypertension Mother   . Stroke Mother   . Stroke Maternal Grandfather   . Heart attack Neg Hx     Past Surgical History:  Procedure Laterality Date  . CARDIAC CATHETERIZATION N/A 10/10/2014   Procedure: Right/Left Heart Cath and Coronary Angiography;  Surgeon: Tonny Bollman, MD; normal coronary arteries, normal LVEDP, systemic hypertension   . REPAIR OF PERFORATED ULCER      ROS: Review of Systems Negative except as above. PHYSICAL EXAM: BP (!) 128/94   Pulse 71   Temp 97.6 F (36.4 C) (Oral)   Resp 16   Wt 160 lb 6.4 oz (72.8 kg)   SpO2 99%   BMI 25.12 kg/m   BP130/84 Physical Exam  General appearance - alert, well appearing, and in no distress Mental status - normal mood, behavior, speech, dress, motor activity, and thought processes Chest - clear to auscultation, no wheezes, rales or rhonchi, symmetric air entry Heart - normal rate, regular rhythm, normal S1, S2, no murmurs, rubs, clicks or gallops Musculoskeletal -left wrist: No edema or erythema.  No point tenderness.  Good range of motion. Extremities -no lower extremity edema.   ASSESSMENT AND PLAN:  1. Essential hypertension Close to goal.  Continue current medications and low-salt diet. - Basic Metabolic Panel  2. Bilateral leg edema Resolved with increased dose of furosemide.  Continue furosemide 20 mg daily.  3. Wrist pain, acute, left Exam currently is benign.  Patient advised to use warm compresses to the wrist.  Follow-up if any worsening of symptoms.    Patient was given the opportunity to ask questions.  Patient verbalized understanding of the plan and was able to repeat key  elements of the plan.   Orders Placed This Encounter  Procedures  . Basic Metabolic Panel     Requested Prescriptions    No prescriptions requested or ordered in this encounter    Return in about 3 months (around 04/30/2018).  Jonah Blue, MD, FACP

## 2018-01-29 NOTE — Patient Instructions (Signed)
Continue current medicaions.

## 2018-01-30 ENCOUNTER — Telehealth: Payer: Self-pay

## 2018-01-30 LAB — BASIC METABOLIC PANEL
BUN / CREAT RATIO: 14 (ref 10–24)
BUN: 16 mg/dL (ref 8–27)
CO2: 25 mmol/L (ref 20–29)
CREATININE: 1.12 mg/dL (ref 0.76–1.27)
Calcium: 8.8 mg/dL (ref 8.6–10.2)
Chloride: 104 mmol/L (ref 96–106)
GFR calc Af Amer: 79 mL/min/{1.73_m2} (ref >59)
GFR calc non Af Amer: 69 mL/min/{1.73_m2} (ref >59)
GLUCOSE: 69 mg/dL (ref 65–99)
Potassium: 4.4 mmol/L (ref 3.5–5.2)
SODIUM: 142 mmol/L (ref 134–144)

## 2018-01-30 NOTE — Telephone Encounter (Signed)
Contacted pt to go over lab results pt is aware and doesn't have any questions or concerns 

## 2018-02-19 MED FILL — FUROSEMIDE 40 MG TAB: 40 | 30 days supply | Qty: 30 | Fill #1

## 2018-02-19 MED FILL — SIMVASTATIN 20 MG TABLET: 20 | 30 days supply | Qty: 30 | Fill #8

## 2018-02-19 MED FILL — AMLODIPINE BESYLATE 10 MG T: 10 | 30 days supply | Qty: 30 | Fill #1

## 2018-02-19 MED FILL — LOSARTAN POTASSIUM 100 MG T: 100 | 30 days supply | Qty: 30 | Fill #8

## 2018-02-22 NOTE — Progress Notes (Signed)
Cardiology Office Note   Date:  02/24/2018   ID:  Jeffrey Martin, DOB 11-Oct-1950, MRN [id number]  PCP:  Jeffrey Matar, MD  Cardiologist:   No primary care provider on file.   Chief Complaint  Patient presents with  . Atrial Fibrillation      History of Present Illness: Jeffrey Martin is a 68 y.o. male who presents for   who presents for evaluation of cardiomyopathy and atrial fibrillation.  I have not seen him since 2017.   He's had a previous CVA. He's been treated with anticoagulation.  His EF is 30-35%. His course is complicated by alcohol, tobacco use, hypotension and  bradycardic. In the past his beta blocker was slightly reduced and his Lasix dose reduced.  He has had intermittent attendance at scheduled office appts.  [facility] but not in a couple of years.     He came early to his appt today!  He says life is easier because he has a phone now and can keep up with appts.  He is taking all of his meds.  We help him get his Elquis.  He says that he walks a couple fo miles daily.  The patient denies any new symptoms such as chest discomfort, neck or arm discomfort. There has been no new shortness of breath, PND or orthopnea. There have been no reported palpitations, presyncope or syncope.      Past Medical History:  Diagnosis Date  . Atrial fibrillation (HCC) 07/2014  . History of cardiovascular stress test    Lexiscan Myoview 7/16:  EF 27%, possible inferoapical ischemia; High Risk; no CAD at cath  . Hyperlipidemia Dx July 2016  . Hypertension Dx June 2016  . NICM (nonischemic cardiomyopathy) (HCC) 11/2014   EF 30-35% by echo, 27% by MV, no CAD at cath  . Peptic ulcer 1988  . Stroke [facility])     Past Surgical History:  Procedure Laterality Date  . CARDIAC CATHETERIZATION N/A 10/10/2014   Procedure: Right/Left Heart Cath and Coronary Angiography;  Surgeon: Jeffrey Bollman, MD; normal coronary arteries, normal LVEDP, systemic  hypertension   . REPAIR OF PERFORATED ULCER       Current Outpatient Medications  Medication Sig Dispense Refill  . amLODipine (NORVASC) 10 MG tablet TAKE 1 TABLET (10 MG TOTAL) BY MOUTH DAILY. 30 tablet 2  . apixaban (ELIQUIS) 5 MG TABS tablet Take 1 tablet (5 mg total) by mouth 2 (two) times daily. 180 tablet 3  . carvedilol (COREG) 12.5 MG tablet Take 1 tablet (12.5 mg total) by mouth 2 (two) times daily. 60 tablet 11  . furosemide (LASIX) 40 MG tablet 1/2 tab PO every day PO 30 tablet 11  . losartan (COZAAR) 100 MG tablet Take 1 tablet (100 mg total) by mouth daily. 90 tablet 3  . Multiple Vitamins-Minerals (MULTIVITAMIN & MINERAL PO) Take 1 tablet by mouth daily.    . simvastatin (ZOCOR) 20 MG tablet Take 1 tablet (20 mg total) by mouth daily. 30 tablet 11   No current facility-administered medications for this visit.     Allergies:   Patient has no known allergies.    ROS:  Please see the history of present illness.   Otherwise, review of systems are positive for positive for mild dysarthria.   All other systems are reviewed and negative.    PHYSICAL EXAM: VS:  BP 100/78   Pulse 63   Ht 5\' 7"  (1.702 m)  Wt 158 lb 9.6 oz (71.9 kg)   BMI 24.84 kg/m  , BMI Body mass index is 24.84 kg/m. GENERAL:  Well appearing NECK:  No jugular venous distention, waveform within normal limits, carotid upstroke brisk and symmetric, no bruits, no thyromegaly LUNGS:  Clear to auscultation bilaterally CHEST:  Unremarkable HEART:  PMI not displaced or sustained,S1 and S2 within normal limits, no S3, no clicks, no rubs, no murmurs, irregular ABD:  Flat, positive bowel sounds normal in frequency in pitch, no bruits, no rebound, no guarding, no midline pulsatile mass, no hepatomegaly, no splenomegaly EXT:  2 plus pulses throughout, no edema, no cyanosis no clubbing    EKG:  EKG is ordered today. The ekg ordered today demonstrates atrial fibrillation, rate 63, axis within normal limits,  intervals within normal limits, no acute ST-T wave changes.  Incomplete right bundle branch block.   Recent Labs: 06/12/2017: ALT 18; Hemoglobin 15.3; Platelets 150 01/29/2018: BUN 16; Creatinine, Ser 1.12; Potassium 4.4; Sodium 142    Lipid Panel    Component Value Date/Time   CHOL 145 06/12/2017 1059   TRIG 63 06/12/2017 1059   HDL 74 06/12/2017 1059   CHOLHDL 2.0 06/12/2017 1059   CHOLHDL 2.8 09/07/2014 0525   VLDL 10 09/07/2014 0525   LDLCALC 58 06/12/2017 1059      Wt Readings from Last 3 Encounters:  02/24/18 158 lb 9.6 oz (71.9 kg)  01/29/18 160 lb 6.4 oz (72.8 kg)  12/30/17 150 lb 12.8 oz (68.4 kg)      Other studies Reviewed: Additional studies/ records that were reviewed today include: Labs. Review of the above records demonstrates:  Please see elsewhere in the note.     ASSESSMENT AND PLAN:  CARDIOMYOPATHY:   The patient's had a moderately reduced ejection fraction.  He did not tolerate a higher dose of carvedilol so he will continue the meds as listed.  Blood pressure will not allow med titration.  He does seem to be euvolemic.  I am going to repeat an echocardiogram as it is been over 2 years.   HTN:  This is being managed in the context of treating his CHF  CVA: This is addressed as below.  ATRIAL FIB:  [name] has a CHA2DS2 - VASc score of 5.  He tolerates anticoagulation.  I did see that he had labs last year that demonstrated normal hemoglobin (slightly elevated).  He does not feel his fibrillation.  No change in therapy is indicated.  He says he's taking his anticoagulant. CBC was OK at the last visit. He is tolerating anticoagulatoin.   ETOH:   He drinks alcohol rarely.  TOBACCO:   A pack of cigarettes lasts him about a week.  He understands that he should quit completely.   Current medicines are reviewed at length with the patient today.  The patient does not have concerns regarding medicines.  The following changes have been made:   no change  Labs/ tests ordered today include:   Orders Placed This Encounter  Procedures  . EKG 12-Lead  . ECHOCARDIOGRAM COMPLETE     Disposition:   FU with with APP in six months.     Signed, Jeffrey Rotunda, MD  02/24/2018 2:41 PM    [facility]

## 2018-02-24 ENCOUNTER — Encounter: Payer: Self-pay | Admitting: Cardiology

## 2018-02-24 ENCOUNTER — Ambulatory Visit (INDEPENDENT_AMBULATORY_CARE_PROVIDER_SITE_OTHER): Payer: Medicare (Managed Care) | Admitting: Cardiology

## 2018-02-24 VITALS — BP 100/78 | HR 63 | Ht 67.0 in | Wt 158.6 lb

## 2018-02-24 DIAGNOSIS — I10 Essential (primary) hypertension: Secondary | ICD-10-CM | POA: Diagnosis not present

## 2018-02-24 DIAGNOSIS — I255 Ischemic cardiomyopathy: Secondary | ICD-10-CM | POA: Diagnosis not present

## 2018-02-24 DIAGNOSIS — I4821 Permanent atrial fibrillation: Secondary | ICD-10-CM | POA: Diagnosis not present

## 2018-02-24 DIAGNOSIS — Z720 Tobacco use: Secondary | ICD-10-CM

## 2018-02-24 NOTE — Patient Instructions (Signed)
Medication Instructions:  Continue current medications  If you need a refill on your cardiac medications before your next appointment, please call your pharmacy.   Lab work: None Ordered  If you have labs (blood work) drawn today and your tests are completely normal, you will receive your results only by: Marland Kitchen MyChart Message (if you have MyChart) OR . A paper copy in the mail If you have any lab test that is abnormal or we need to change your treatment, we will call you to review the results.  Testing/Procedures: Your physician has requested that you have an echocardiogram. Echocardiography is a painless test that uses sound waves to create images of your heart. It provides your doctor with information about the size and shape of your heart and how well your heart's chambers and valves are working. This procedure takes approximately one hour. There are no restrictions for this procedure.  Follow-Up: At [facility], you and your health needs are our priority.  As part of our continuing mission to provide you with exceptional heart care, we have created designated Provider Care Teams.  These Care Teams include your primary Cardiologist (physician) and Advanced Practice Providers (APPs -  Physician Assistants and Nurse Practitioners) who all work together to provide you with the care you need, when you need it. You will need a follow up appointment in 6 months.  Please call our office 2 months in advance to schedule this appointment.  You may see Dr Antoine Poche or one of the following Advanced Practice Providers on your designated Care Team:   Theodore Demark, PA-C . Joni Reining, DNP, ANP

## 2018-02-27 ENCOUNTER — Other Ambulatory Visit (HOSPITAL_COMMUNITY): Payer: Medicare HMO

## 2018-03-18 MED FILL — SIMVASTATIN 20 MG TABLET: 20 | 30 days supply | Qty: 30 | Fill #9

## 2018-03-18 MED FILL — AMLODIPINE BESYLATE 10 MG T: 10 | 30 days supply | Qty: 30 | Fill #2

## 2018-03-18 MED FILL — LOSARTAN POTASSIUM 100 MG T: 100 | 30 days supply | Qty: 30 | Fill #9

## 2018-03-18 MED FILL — FUROSEMIDE 40 MG TAB: 40 | 30 days supply | Qty: 30 | Fill #2

## 2018-04-20 ENCOUNTER — Other Ambulatory Visit: Payer: Self-pay | Admitting: Internal Medicine

## 2018-04-20 MED FILL — LOSARTAN POTASSIUM 100 MG T: 100 | 30 days supply | Qty: 30 | Fill #10

## 2018-04-20 MED FILL — SIMVASTATIN 20 MG TABLET: 20 | 30 days supply | Qty: 30 | Fill #10

## 2018-04-20 MED FILL — CARVEDILOL 12.5 MG TABLET: 12.5 | 30 days supply | Qty: 60 | Fill #6

## 2018-04-21 MED FILL — AMLODIPINE BESYLATE 10 MG T: 10 | 30 days supply | Qty: 30 | Fill #0

## 2018-05-05 ENCOUNTER — Encounter: Payer: Self-pay | Admitting: Internal Medicine

## 2018-05-05 ENCOUNTER — Ambulatory Visit: Payer: Medicare HMO | Attending: Internal Medicine | Admitting: Internal Medicine

## 2018-05-05 ENCOUNTER — Other Ambulatory Visit: Payer: Self-pay

## 2018-05-05 VITALS — BP 105/80 | HR 60 | Temp 97.6°F | Resp 16 | Ht 67.0 in | Wt 152.2 lb

## 2018-05-05 DIAGNOSIS — I482 Chronic atrial fibrillation, unspecified: Secondary | ICD-10-CM

## 2018-05-05 DIAGNOSIS — I10 Essential (primary) hypertension: Secondary | ICD-10-CM | POA: Diagnosis not present

## 2018-05-05 DIAGNOSIS — F17200 Nicotine dependence, unspecified, uncomplicated: Secondary | ICD-10-CM

## 2018-05-05 DIAGNOSIS — Z1211 Encounter for screening for malignant neoplasm of colon: Secondary | ICD-10-CM

## 2018-05-05 DIAGNOSIS — I5022 Chronic systolic (congestive) heart failure: Secondary | ICD-10-CM | POA: Diagnosis not present

## 2018-05-05 DIAGNOSIS — E785 Hyperlipidemia, unspecified: Secondary | ICD-10-CM

## 2018-05-05 DIAGNOSIS — F17210 Nicotine dependence, cigarettes, uncomplicated: Secondary | ICD-10-CM | POA: Diagnosis not present

## 2018-05-05 MED ORDER — NICOTINE POLACRILEX 2 MG MT GUM: 2.0000 mg | CHEWING_GUM | OROMUCOSAL | 0 refills | Status: DC | PRN

## 2018-05-05 MED ORDER — NICOTINE POLACRILEX 2 MG MT LOZG: 2.0000 mg | LOZENGE | OROMUCOSAL | 0 refills | Status: DC | PRN

## 2018-05-05 MED FILL — SM NICOTINE 2 MG LOZENGE: 2 | 24 days supply | Qty: 24 | Fill #0

## 2018-05-05 NOTE — Progress Notes (Signed)
Patient ID: Jeffrey Martin, male    DOB: 10-11-1950  MRN: [id number]  CC: Hypertension   Subjective: Jeffrey Martin is a 68 y.o. male who presents for chronic disease management. His concerns today include:  Patient with history of HTN, HL, A. 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  SOB at times with exertion. Walks daily if temp allows. No hematuria, epistaxis or blood in stools Saw Dr. Antoine Poche 02/2018  HL:  Compliant with Zocor.  No cramps  Tob:  Smoking 1-2 cig several times a wk.  States he sometimes goes 1 wk without smoking.  Hx of CVA:  Walks with cane.  No falls   HM: And given fit test in October 2019.  Patient states that he took it with him to [place] and left it at a friend's house.  He would like to be given a new cath if possible  Patient Active Problem List   Diagnosis Date Noted  . Chronic systolic CHF (congestive heart failure), NYHA class 2 (HCC) 12/27/2014  . Cerebrovascular accident (CVA) due to embolism of right middle cerebral artery (HCC) 11/24/2014  . 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 Chronic anticoagulation 11/24/2014  . Cardiomyopathy (HCC) 11/24/2014  . Chronic systolic heart failure (HCC) 10/10/2014  . Alcohol use disorder, mild, abuse 09/13/2014  . Cardiomyopathy, ischemic   . Atrial fibrillation (HCC) 09/07/2014  . Essential hypertension   . HLD (hyperlipidemia)   . Tobacco use disorder      Current Outpatient Medications on File Prior to Visit  Medication Sig Dispense Refill  . amLODipine (NORVASC) 10 MG tablet TAKE 1 TABLET (10 MG TOTAL) BY MOUTH DAILY. 30 tablet 2  . apixaban (ELIQUIS) 5 MG TABS tablet Take 1 tablet (5 mg total) by mouth 2 (two) times daily. 180 tablet 3  . carvedilol (COREG) 12.5 MG tablet Take 1  tablet (12.5 mg total) by mouth 2 (two) times daily. 60 tablet 11  . furosemide (LASIX) 40 MG tablet 1/2 tab PO every day PO 30 tablet 11  . losartan (COZAAR) 100 MG tablet Take 1 tablet (100 mg total) by mouth daily. 90 tablet 3  . Multiple Vitamins-Minerals (MULTIVITAMIN & MINERAL PO) Take 1 tablet by mouth daily.    . simvastatin (ZOCOR) 20 MG tablet Take 1 tablet (20 mg total) by mouth daily. 30 tablet 11   No current facility-administered medications on file prior to visit.     No Known Allergies  Social History   Socioeconomic History  . Marital status: Widowed    Spouse name: Not on file  . Number of children: Not on file  . Years of education: Not on file  . Highest education level: Not on file  Occupational History  . Not on file  Social Needs  . Financial resource strain: Not on file  . Food insecurity:    Worry: Not on file    Inability: Not on file  . Transportation needs:    Medical: Not on file    Non-medical: Not on file  Tobacco Use  . Smoking status: Current Some Day Smoker    Packs/day: 0.50    Types: Cigarettes    Start date: 09/05/2014    Last attempt to quit: 09/05/2014    Years since quitting: 3.6  . Smokeless tobacco: Former  User  Substance and Sexual Activity  . Alcohol use: Yes    Alcohol/week: 0.0 standard drinks    Comment: rare  . Drug use: No  . Sexual activity: Not on file  Lifestyle  . Physical activity:    Days per week: Not on file    Minutes per session: Not on file  . Stress: Not on file  Relationships  . Social connections:    Talks on phone: Not on file    Gets together: Not on file    Attends religious service: Not on file    Active member of club or organization: Not on file    Attends meetings of clubs or organizations: Not on file    Relationship status: Not on file  . Intimate partner violence:    Fear of current or ex partner: Not on file    Emotionally abused: Not on file    Physically abused: Not on file    Forced  sexual activity: Not on file  Other Topics Concern  . Not on file  Social History Narrative   Patient lives with his wife   His son or daughter-in-law will accompany him to appointments     Family History  Problem Relation Age of Onset  . Hypertension Mother   . Stroke Mother   . Stroke Maternal Grandfather   . Heart attack Neg Hx     Past Surgical History:  Procedure Laterality Date  . CARDIAC CATHETERIZATION N/A 10/10/2014   Procedure: Right/Left Heart Cath and Coronary Angiography;  Surgeon: Tonny Bollman, MD; normal coronary arteries, normal LVEDP, systemic hypertension   . REPAIR OF PERFORATED ULCER      ROS: Review of Systems  Constitutional: Negative for appetite change and fatigue.  Psychiatric/Behavioral: Negative for dysphoric mood. The patient is not nervous/anxious.    Negative except as stated above  PHYSICAL EXAM: BP 105/80   Pulse 60   Temp 97.6 F (36.4 C) (Oral)   Resp 16   Ht 5\' 7"  (1.702 m)   Wt 152 lb 3.2 oz (69 kg)   SpO2 95%   BMI 23.84 kg/m   Wt Readings from Last 3 Encounters:  05/05/18 152 lb 3.2 oz (69 kg)  02/24/18 158 lb 9.6 oz (71.9 kg)  01/29/18 160 lb 6.4 oz (72.8 kg)    Physical Exam General appearance - alert, well appearing, and in no distress Mental status - normal mood, behavior, speech, dress, motor activity, and thought processes Neck - supple, no significant adenopathy Chest - clear to auscultation, no wheezes, rales or rhonchi, symmetric air entry Heart -heart rate is irregularly irregular but rate controlled.  No gallops or murmurs heard.  No JVD. Neurological -gait is steady.  Ambulates with a cane Extremities -no lower extremity edema.  CMP Latest Ref Rng & Units 01/29/2018 11/25/2017 09/29/2017  Glucose 65 - 99 mg/dL 69 79 97  BUN 8 - 27 mg/dL 16 14 12   Creatinine 0.76 - 1.27 mg/dL 1.00 7.12 1.97(J)  Sodium 134 - 144 mmol/L 142 145(H) 146(H)  Potassium 3.5 - 5.2 mmol/L 4.4 3.9 3.4(L)  Chloride 96 - 106 mmol/L 104  105 106  CO2 20 - 29 mmol/L 25 25 22   Calcium 8.6 - 10.2 mg/dL 8.8 9.0 9.5  Total Protein 6.0 - 8.5 g/dL - - -  Total Bilirubin 0.0 - 1.2 mg/dL - - -  Alkaline Phos 39 - 117 IU/L - - -  AST 0 - 40 IU/L - - -  ALT 0 -  44 IU/L - - -   Lipid Panel     Component Value Date/Time   CHOL 145 06/12/2017 1059   TRIG 63 06/12/2017 1059   HDL 74 06/12/2017 1059   CHOLHDL 2.0 06/12/2017 1059   CHOLHDL 2.8 09/07/2014 0525   VLDL 10 09/07/2014 0525   LDLCALC 58 06/12/2017 1059    CBC    Component Value Date/Time   WBC 5.8 06/12/2017 1059   WBC 5.0 09/11/2015 1021   RBC 5.06 06/12/2017 1059   RBC 5.64 09/11/2015 1021   HGB 15.3 06/12/2017 1059   HCT 47.4 06/12/2017 1059   PLT 150 06/12/2017 1059   MCV 94 06/12/2017 1059   MCH 30.2 06/12/2017 1059   MCH 30.0 09/11/2015 1021   MCHC 32.3 06/12/2017 1059   MCHC 33.2 09/11/2015 1021   RDW 15.4 06/12/2017 1059   LYMPHSABS 1.2 06/12/2017 1059   MONOABS 0.7 09/06/2014 2254   EOSABS 0.0 06/12/2017 1059   BASOSABS 0.0 06/12/2017 1059    ASSESSMENT AND PLAN: 1. Essential hypertension At goal.  Continue losartan, carvedilol and low-salt diet. - CBC  2. Atrial fibrillation, chronic Rate control with carvedilol.  Tolerating Eliquis without bleeds.  3. Chronic systolic heart failure (HCC) Compensated.  4. Tobacco dependence Advised to quit.  Discussed health risks associated with smoking.  He is willing to try the nicotine lozenges. Less than 5 minutes spent with counseling.  5. Hyperlipidemia, unspecified hyperlipidemia type Continue Zocor. - Lipid panel - Hepatic Function Panel  6. Screening for colon cancer Patient given another fit test instructed on how to use it.  Encouraged him to use it immediately and mail it out. - Fecal occult blood, imunochemical([facility])    Patient was given the opportunity to ask questions.  Patient verbalized understanding of the plan and was able to repeat key elements of the plan.    Orders Placed This Encounter  Procedures  . Fecal occult blood, imunochemical([facility])  . CBC  . Lipid panel  . Hepatic Function Panel     Requested Prescriptions   Signed Prescriptions Disp Refills  . nicotine polacrilex (COMMIT) 2 MG lozenge 100 tablet 0    Sig: Take 1 lozenge (2 mg total) by mouth as needed for smoking cessation.    Return in about 3 months (around 08/05/2018).  Jonah Blue, MD, FACP

## 2018-05-05 NOTE — Patient Instructions (Signed)
Continue to work on trying to quit smoking.  I have sent a prescription to our pharmacy for the nicotine gum for you to use as needed.  Please remember to use and mail off the screening test that will be given to you today for colon cancer screening.

## 2018-05-06 ENCOUNTER — Telehealth: Payer: Self-pay | Admitting: Internal Medicine

## 2018-05-06 DIAGNOSIS — R748 Abnormal levels of other serum enzymes: Secondary | ICD-10-CM

## 2018-05-06 LAB — CBC
Hematocrit: 40.2 % (ref 37.5–51.0)
Hemoglobin: 13.3 g/dL (ref 13.0–17.7)
MCH: 29.4 pg (ref 26.6–33.0)
MCHC: 33.1 g/dL (ref 31.5–35.7)
MCV: 89 fL (ref 79–97)
PLATELETS: 161 10*3/uL (ref 150–450)
RBC: 4.53 x10E6/uL (ref 4.14–5.80)
RDW: 13.7 % (ref 11.6–15.4)
WBC: 4.4 10*3/uL (ref 3.4–10.8)

## 2018-05-06 LAB — HEPATIC FUNCTION PANEL
ALBUMIN: 3.8 g/dL (ref 3.8–4.8)
ALT: 10 IU/L (ref 0–44)
AST: 18 IU/L (ref 0–40)
Alkaline Phosphatase: 188 IU/L — ABNORMAL HIGH (ref 39–117)
BILIRUBIN TOTAL: 0.3 mg/dL (ref 0.0–1.2)
BILIRUBIN, DIRECT: 0.1 mg/dL (ref 0.00–0.40)
Total Protein: 6.6 g/dL (ref 6.0–8.5)

## 2018-05-06 LAB — LIPID PANEL
CHOLESTEROL TOTAL: 99 mg/dL — AB (ref 100–199)
Chol/HDL Ratio: 1.9 ratio (ref 0.0–5.0)
HDL: 53 mg/dL (ref >39)
LDL Calculated: 33 mg/dL (ref 0–99)
TRIGLYCERIDES: 67 mg/dL (ref 0–149)
VLDL Cholesterol Cal: 13 mg/dL (ref 5–40)

## 2018-05-06 NOTE — Telephone Encounter (Signed)
This is my lab message to pt:  Let pt know that blood count reveals no anemia.  Cholesterol level is good.  One of his liver enzymes is abnormal.  Would like to do additional lab test to investigate further.  Please return to the lab at his earliest convenience.

## 2018-05-07 ENCOUNTER — Telehealth: Payer: Self-pay

## 2018-05-07 NOTE — Telephone Encounter (Signed)
Contacted pt to go over lab results pt is aware and doesn't have any questions or concerns 

## 2018-05-21 MED FILL — FUROSEMIDE 40 MG TAB: 40 | 60 days supply | Qty: 30 | Fill #3

## 2018-05-21 MED FILL — AMLODIPINE BESYLATE 10 MG T: 10 | 30 days supply | Qty: 30 | Fill #1

## 2018-05-21 MED FILL — CARVEDILOL 12.5 MG TABLET: 12.5 | 30 days supply | Qty: 60 | Fill #7

## 2018-05-21 MED FILL — LOSARTAN POTASSIUM 100 MG T: 100 | 30 days supply | Qty: 30 | Fill #11

## 2018-05-21 MED FILL — SIMVASTATIN 20 MG TABLET: 20 | 30 days supply | Qty: 30 | Fill #11

## 2018-05-25 ENCOUNTER — Telehealth: Payer: Self-pay | Admitting: Internal Medicine

## 2018-05-25 NOTE — Telephone Encounter (Signed)
New Message   1) Medication(s) Requested (by name):   amLODipine (NORVASC) 10 MG tablet    apixaban (ELIQUIS) 5 MG TABS tablet    carvedilol (COREG) 12.5 MG tablet    furosemide (LASIX) 40 MG tablet    losartan (COZAAR) 100 MG tablet      2) Pharmacy of Choice: [facility]  3) Special Requests: PT states he has been out of his medication for 4 days   Approved medications will be sent to the pharmacy, we will reach out if there is an issue.  Requests made after 3pm may not be addressed until the following business day!  If a patient is unsure of the name of the medication(s) please note and ask patient to call back when they are able to provide all info, do not send to responsible party until all information is available!

## 2018-05-25 NOTE — Telephone Encounter (Signed)
Meds have been shipped to patients house, they should arrive any day.

## 2018-06-23 ENCOUNTER — Other Ambulatory Visit: Payer: Self-pay | Admitting: Physician Assistant

## 2018-06-23 DIAGNOSIS — E785 Hyperlipidemia, unspecified: Secondary | ICD-10-CM

## 2018-06-23 MED FILL — AMLODIPINE BESYLATE 10 MG T: 10 | 30 days supply | Qty: 30 | Fill #2

## 2018-06-24 MED FILL — LOSARTAN POTASSIUM 100 MG T: 100 | 90 days supply | Qty: 90 | Fill #0

## 2018-06-24 MED FILL — SIMVASTATIN 20 MG TABLET: 20 | 30 days supply | Qty: 30 | Fill #0

## 2018-07-28 ENCOUNTER — Other Ambulatory Visit: Payer: Self-pay | Admitting: Internal Medicine

## 2018-07-28 ENCOUNTER — Other Ambulatory Visit: Payer: Self-pay | Admitting: Physician Assistant

## 2018-07-28 DIAGNOSIS — I480 Paroxysmal atrial fibrillation: Secondary | ICD-10-CM

## 2018-07-28 MED FILL — CARVEDILOL 12.5 MG TABLET: 12.5 | 30 days supply | Qty: 60 | Fill #0

## 2018-07-28 MED FILL — FUROSEMIDE 40 MG TAB: 40 | 60 days supply | Qty: 30 | Fill #4

## 2018-07-28 MED FILL — SIMVASTATIN 20 MG TABLET: 20 | 30 days supply | Qty: 30 | Fill #1

## 2018-07-28 MED FILL — AMLODIPINE BESYLATE 10 MG T: 10 | 30 days supply | Qty: 30 | Fill #0

## 2018-08-07 ENCOUNTER — Encounter: Payer: Self-pay | Admitting: Internal Medicine

## 2018-08-07 ENCOUNTER — Ambulatory Visit: Payer: Medicare HMO | Attending: Internal Medicine | Admitting: Internal Medicine

## 2018-08-07 ENCOUNTER — Other Ambulatory Visit: Payer: Self-pay

## 2018-08-07 DIAGNOSIS — I10 Essential (primary) hypertension: Secondary | ICD-10-CM

## 2018-08-07 DIAGNOSIS — F17200 Nicotine dependence, unspecified, uncomplicated: Secondary | ICD-10-CM

## 2018-08-07 DIAGNOSIS — R748 Abnormal levels of other serum enzymes: Secondary | ICD-10-CM

## 2018-08-07 DIAGNOSIS — F17210 Nicotine dependence, cigarettes, uncomplicated: Secondary | ICD-10-CM

## 2018-08-07 DIAGNOSIS — E785 Hyperlipidemia, unspecified: Secondary | ICD-10-CM

## 2018-08-07 DIAGNOSIS — I482 Chronic atrial fibrillation, unspecified: Secondary | ICD-10-CM

## 2018-08-07 DIAGNOSIS — I5022 Chronic systolic (congestive) heart failure: Secondary | ICD-10-CM

## 2018-08-07 DIAGNOSIS — Z1211 Encounter for screening for malignant neoplasm of colon: Secondary | ICD-10-CM

## 2018-08-07 NOTE — Progress Notes (Signed)
Virtual Visit via Telephone Note Due to current restrictions/limitations of in-office visits due to the COVID-19 pandemic, this scheduled clinical appointment was converted to a telehealth visit I connected with Jeffrey Martin on 08/07/18 at 1:48 p.m EDT by telephone and verified that I am speaking with the correct person using two identifiers. I am in my office.  The patient is at home.  Only the patient and myself participated in this encounter.  I discussed the limitations, risks, security and privacy concerns of performing an evaluation and management service by telephone and the availability of in person appointments. I also discussed with the patient that there may be a patient responsible charge related to this service. The patient expressed understanding and agreed to proceed.   History of Present Illness: Patient with history of HTN, HL, A. 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  Patient last seen 04/2018.  HTN/A.fib/NICM Currently taking: see medication list Med Adherence: _0  Yes    _1  No Medication side effects: _2  Yes    _3  No Adherence with salt restriction: _4  Yes    _5  No Home Monitoring?: _6  Yes at the pharmacy.  He does not recall his numbers but reports that the pharmacist told him that they have been good. Monitoring Frequency: _7  Yes    _8  No Home BP results range: _9  Yes    _10  No SOB? _11  Yes    _12  No Chest Pain?: _13  Yes    _14  No Leg swelling?: _15  Yes    _16  No Headaches?: _17  Yes    _18  No Dizziness? _19  Yes    _20  No Comments:  No palpitations, no bruising or bleeding on Eliquis Walking almost daily.  No falls.  Keeps his cane with him to use if needing  HL:  Compliant with Zocor Patient had elevation of alk phos on last LFTs.  I requested that he return to the lab to have additional blood test done.  He has not done so as yet but plans to do so.  Tob:  Still smoking 1-2 cig/wk.  Nicotine  losengers are helpful.  He still plans to quit.  He has not set a quit date.   Health maintenance: Given fit test on last visit.  Patient states he still has it at home.  I have encouraged him to use it and turn it in   Outpatient Encounter Medications as of 08/07/2018  Medication Sig  . amLODipine (NORVASC) 10 MG tablet TAKE 1 TABLET (10 MG TOTAL) BY MOUTH DAILY.  Marland Kitchen apixaban (ELIQUIS) 5 MG TABS tablet Take 1 tablet (5 mg total) by mouth 2 (two) times daily.  . carvedilol (COREG) 12.5 MG tablet TAKE 1 TABLET BY MOUTH 2 (TWO) TIMES DAILY.  . furosemide (LASIX) 40 MG tablet 1/2 tab PO every day PO  . losartan (COZAAR) 100 MG tablet TAKE 1 TABLET BY MOUTH DAILY.  . Multiple Vitamins-Minerals (MULTIVITAMIN & MINERAL PO) Take 1 tablet by mouth daily.  . nicotine polacrilex (COMMIT) 2 MG lozenge Take 1 lozenge (2 mg total) by mouth as needed for smoking cessation.  . simvastatin (ZOCOR) 20 MG tablet TAKE 1 TABLET BY MOUTH DAILY.   No facility-administered encounter medications on file as of 08/07/2018.     Observations/Objective: Results for orders placed or performed in visit on 05/05/18  CBC  Result Value Ref Range   WBC 4.4 3.4 - 10.8 x10E3/uL   RBC 4.53 4.14 - 5.80 x10E6/uL  Hemoglobin 13.3 13.0 - 17.7 g/dL   Hematocrit 40.2 37.5 - 51.0 %   MCV 89 79 - 97 fL   MCH 29.4 26.6 - 33.0 pg   MCHC 33.1 31.5 - 35.7 g/dL   RDW 13.7 11.6 - 15.4 %   Platelets 161 150 - 450 x10E3/uL  Lipid panel  Result Value Ref Range   Cholesterol, Total 99 (L) 100 - 199 mg/dL   Triglycerides 67 0 - 149 mg/dL   HDL 53 >39 mg/dL   VLDL Cholesterol Cal 13 5 - 40 mg/dL   LDL Calculated 33 0 - 99 mg/dL   Chol/HDL Ratio 1.9 0.0 - 5.0 ratio  Hepatic Function Panel  Result Value Ref Range   Total Protein 6.6 6.0 - 8.5 g/dL   Albumin 3.8 3.8 - 4.8 g/dL   Bilirubin Total 0.3 0.0 - 1.2 mg/dL   Bilirubin, Direct 0.10 0.00 - 0.40 mg/dL   Alkaline Phosphatase 188 (H) 39 - 117 IU/L   AST 18 0 - 40 IU/L   ALT 10  0 - 44 IU/L    Assessment and Plan: 1. Essential hypertension Level of control unknown.  Patient states that he has been going to a nearby pharmacy about twice a month and the pharmacist has told him that his readings have been good.  He will continue his current medications  2. Atrial fibrillation, chronic Continue carvedilol and Eliquis  3. Chronic systolic heart failure (HCC) Based on history he is compensated.  Continue carvedilol, furosemide, Cozaar  4. Hyperlipidemia, unspecified hyperlipidemia type Continue Zocor  5. Tobacco dependence Advised to quit.  He will continue using the nicotine lozenges.  Encouraged him to set a quit date.  Less than 5 minutes spent on counseling.  6. Elevated alkaline phosphatase level Patient will come to the lab to have the additional blood test done that were ordered on last visit  7. Colon cancer screening Encouraged him to use the fit test and mail it in as soon as possible   Follow Up Instructions: Follow-up in 3 months   I discussed the assessment and treatment plan with the patient. The patient was provided an opportunity to ask questions and all were answered. The patient agreed with the plan and demonstrated an understanding of the instructions.   The patient was advised to call back or seek an in-person evaluation if the symptoms worsen or if the condition fails to improve as anticipated.  I provided 7 minutes of non-face-to-face time during this encounter.   Karle Plumber, MD

## 2018-08-27 ENCOUNTER — Telehealth: Payer: Self-pay

## 2018-08-27 NOTE — Telephone Encounter (Signed)
Spoke with patient and converted appointment into office visit and informed him only he can come into the office.

## 2018-08-30 NOTE — Progress Notes (Signed)
Cardiology Office Note   Date:  09/01/2018   ID:  Jeffrey Martin, DOB 15-May-1950, MRN [id number]  PCP:  Jeffrey Pier, MD  Cardiologist:   No primary care provider on file.   Chief Complaint  Patient presents with  . Cardiomyopathy      History of Present Illness: Jeffrey Martin is a 68 y.o. male who presents for evaluation of cardiomyopathy and atrial fibrillation.   He's had a previous CVA. He's been treated with anticoagulation.  His EF is 30-35%. His course is complicated by alcohol, tobacco use, hypotension and  bradycardic. In the past his beta blocker was slightly reduced and his Lasix dose reduced.  He has had intermittent attendance at scheduled office appts.  [facility] but not in a couple of years.   He did come to an office appt in Jan but did not show up for an echo that was ordered in follow up.    Since I last saw him he is done well.  Of note is not taking his Norvasc as he ran out he is taking all of his other medications I think as prescribed.  The patient denies any new symptoms such as chest discomfort, neck or arm discomfort. There has been no new shortness of breath, PND or orthopnea. There have been no reported palpitations, presyncope or syncope.    Past Medical History:  Diagnosis Date  . Atrial fibrillation ([place]) 07/2014  . History of cardiovascular stress test    Lexiscan Myoview 7/16:  EF 27%, possible inferoapical ischemia; High Risk; no CAD at cath  . Hyperlipidemia Dx July 2016  . Hypertension Dx June 2016  . NICM (nonischemic cardiomyopathy) ([place]) 11/2014   EF 30-35% by echo, 27% by MV, no CAD at cath  . Peptic ulcer 1988  . Stroke [facility])     Past Surgical History:  Procedure Laterality Date  . CARDIAC CATHETERIZATION N/A 10/10/2014   Procedure: Right/Left Heart Cath and Coronary Angiography;  Surgeon: Jeffrey Mocha, MD; normal coronary arteries, normal LVEDP, systemic hypertension   . REPAIR OF  PERFORATED ULCER       Current Outpatient Medications  Medication Sig Dispense Refill  . apixaban (ELIQUIS) 5 MG TABS tablet Take 1 tablet (5 mg total) by mouth 2 (two) times daily. 180 tablet 3  . carvedilol (COREG) 12.5 MG tablet TAKE 1 TABLET BY MOUTH 2 (TWO) TIMES DAILY. 60 tablet 0  . furosemide (LASIX) 40 MG tablet 1/2 tab PO every day PO 30 tablet 11  . losartan (COZAAR) 100 MG tablet TAKE 1 TABLET BY MOUTH DAILY. 30 tablet 0  . Multiple Vitamins-Minerals (MULTIVITAMIN & MINERAL PO) Take 1 tablet by mouth daily.    . nicotine polacrilex (COMMIT) 2 MG lozenge Take 1 lozenge (2 mg total) by mouth as needed for smoking cessation. 100 tablet 0  . simvastatin (ZOCOR) 20 MG tablet TAKE 1 TABLET BY MOUTH DAILY. 30 tablet 2   No current facility-administered medications for this visit.     Allergies:   Patient has no known allergies.    ROS:  Please see the history of present illness.   Otherwise, review of systems are positive for positive for none.   All other systems are reviewed and negative.    PHYSICAL EXAM: VS:  BP 96/64   Pulse 78   Ht 5\' 7"  (1.702 m)   Wt 142 lb (64.4 kg)   BMI 22.24 kg/m  , BMI Body mass  index is 22.24 kg/m. GENERAL:  Well appearing NECK:  No jugular venous distention, waveform within normal limits, carotid upstroke brisk and symmetric, no bruits, no thyromegaly LUNGS:  Clear to auscultation bilaterally CHEST:  Unremarkable HEART:  PMI not displaced or sustained,S1 and S2 within normal limits, no S3, no clicks, no rubs, no murmurs, irregular ABD:  Flat, positive bowel sounds normal in frequency in pitch, no bruits, no rebound, no guarding, no midline pulsatile mass, no hepatomegaly, no splenomegaly EXT:  2 plus pulses throughout, no edema, no cyanosis no clubbing   EKG:  EKG is  ordered today. The ekg ordered today demonstrates atrial fibrillation, rate 78, axis within normal limits, intervals within normal limits, no acute ST-T wave changes.   Incomplete right bundle branch block.  PVCs   Recent Labs: 01/29/2018: BUN 16; Creatinine, Ser 1.12; Potassium 4.4; Sodium 142 05/05/2018: ALT 10; Hemoglobin 13.3; Platelets 161    Lipid Panel    Component Value Date/Time   CHOL 99 (L) 05/05/2018 1437   TRIG 67 05/05/2018 1437   HDL 53 05/05/2018 1437   CHOLHDL 1.9 05/05/2018 1437   CHOLHDL 2.8 09/07/2014 0525   VLDL 10 09/07/2014 0525   LDLCALC 33 05/05/2018 1437      Wt Readings from Last 3 Encounters:  09/01/18 142 lb (64.4 kg)  05/05/18 152 lb 3.2 oz (69 kg)  02/24/18 158 lb 9.6 oz (71.9 kg)      Other studies Reviewed: Additional studies/ records that were reviewed today include: None. Review of the above records demonstrates:  Please see elsewhere in the note.     ASSESSMENT AND PLAN:  CARDIOMYOPATHY:   He has not tolerated med titration with increase beta-blocker.  I think he is taking his medications as prescribed and is very happy about this.  He seems to be euvolemic.  He had some problems with his insurance he since we did come back for his echo but he does consent to have this done.  For now meds will continue as listed other than the changes below.    HTN: His blood pressure is running low.  I am going to stop his Norvasc altogether.   CVA: He says he has no residual from this.  No change in therapy.   ATRIAL FIB:  [name] has a CHA2DS2 - VASc score of 5.  No change in therapy.  ETOH:   He says he drinks 3-4 beers per week and no liquor.   TOBACCO: He is down to a few cigarettes a day and he promises to quit.  Current medicines are reviewed at length with the patient today.  The patient does not have concerns regarding medicines.  The following changes have been made:  None  Labs/ tests ordered today include:   Orders Placed This Encounter  Procedures  . EKG 12-Lead  . ECHOCARDIOGRAM COMPLETE     Disposition:   FU with with APP in the office in six months.    Signed, Jeffrey Rotunda, MD  09/01/2018 3:29 PM    [facility]

## 2018-08-31 ENCOUNTER — Telehealth: Payer: Self-pay | Admitting: Cardiology

## 2018-08-31 ENCOUNTER — Other Ambulatory Visit: Payer: Self-pay | Admitting: Internal Medicine

## 2018-08-31 MED FILL — AMLODIPINE BESYLATE 10 MG T: 10 | 30 days supply | Qty: 30 | Fill #0

## 2018-08-31 MED FILL — SIMVASTATIN 20 MG TABLET: 20 | 30 days supply | Qty: 30 | Fill #2

## 2018-08-31 NOTE — Telephone Encounter (Signed)
LVM, reminding pt of his appt with Dr Percival Spanish on 09-01-18.

## 2018-09-01 ENCOUNTER — Ambulatory Visit (INDEPENDENT_AMBULATORY_CARE_PROVIDER_SITE_OTHER): Payer: Medicare (Managed Care) | Admitting: Cardiology

## 2018-09-01 ENCOUNTER — Other Ambulatory Visit: Payer: Self-pay

## 2018-09-01 ENCOUNTER — Encounter: Payer: Self-pay | Admitting: Cardiology

## 2018-09-01 VITALS — BP 96/64 | HR 78 | Ht 67.0 in | Wt 142.0 lb

## 2018-09-01 DIAGNOSIS — Z720 Tobacco use: Secondary | ICD-10-CM

## 2018-09-01 DIAGNOSIS — I482 Chronic atrial fibrillation, unspecified: Secondary | ICD-10-CM

## 2018-09-01 DIAGNOSIS — I255 Ischemic cardiomyopathy: Secondary | ICD-10-CM | POA: Diagnosis not present

## 2018-09-01 DIAGNOSIS — I5022 Chronic systolic (congestive) heart failure: Secondary | ICD-10-CM | POA: Diagnosis not present

## 2018-09-01 NOTE — Patient Instructions (Signed)
Medication Instructions:  Your physician recommends that you continue on your current medications as directed. Please refer to the Current Medication list given to you today.  If you need a refill on your cardiac medications before your next appointment, please call your pharmacy.   Lab work: NONE   Testing/Procedures: Your physician has requested that you have an echocardiogram. Echocardiography is a painless test that uses sound waves to create images of your heart. It provides your doctor with information about the size and shape of your heart and how well your heart's chambers and valves are working. This procedure takes approximately one hour. There are no restrictions for this procedure. [employer] [address]  Follow-Up: At [facility], you and your health needs are our priority.  As part of our continuing mission to provide you with exceptional heart care, we have created designated Provider Care Teams.  These Care Teams include your primary Cardiologist (physician) and Advanced Practice Providers (APPs -  Physician Assistants and Nurse Practitioners) who all work together to provide you with the care you need, when you need it. You will need a follow up appointment in 3 months.  YOU WILL SEE one of the following Advanced Practice Providers on your designated Care Team:   Rosaria Ferries, PA-C . Jory Sims, DNP, ANP

## 2018-09-04 ENCOUNTER — Telehealth: Payer: Self-pay | Admitting: *Deleted

## 2018-09-04 DIAGNOSIS — I480 Paroxysmal atrial fibrillation: Secondary | ICD-10-CM

## 2018-09-04 MED ORDER — APIXABAN 5 MG PO TABS: 5.0000 mg | ORAL_TABLET | Freq: Two times a day (BID) | ORAL | 3 refills | Status: DC

## 2018-09-04 NOTE — Telephone Encounter (Signed)
Patient brought Eliquis patient assistance papers for Dr Percival Spanish to fill out and sign. Waiting on signature and will fax

## 2018-09-04 NOTE — Telephone Encounter (Signed)
Faxed as requested

## 2018-09-08 ENCOUNTER — Other Ambulatory Visit: Payer: Self-pay

## 2018-09-08 ENCOUNTER — Ambulatory Visit (HOSPITAL_COMMUNITY): Payer: Medicare (Managed Care) | Attending: Cardiology

## 2018-09-08 DIAGNOSIS — I255 Ischemic cardiomyopathy: Secondary | ICD-10-CM | POA: Diagnosis present

## 2018-09-08 MED FILL — AMLODIPINE BESYLATE 10 MG T: 10 | 30 days supply | Qty: 30 | Fill #0

## 2018-09-24 ENCOUNTER — Telehealth: Payer: Self-pay | Admitting: Cardiology

## 2018-09-24 NOTE — Telephone Encounter (Signed)
Unable to reach pt or leave a message wrong number 

## 2018-09-24 NOTE — Telephone Encounter (Signed)
New Message     Pt is calling for the results of his Echo    Please call back

## 2018-09-25 NOTE — Telephone Encounter (Signed)
Notes recorded by Minus Breeding, MD on 09/09/2018 at 9:21 PM EDT  The EF is mildly reduced but better than reported previously. The MR is mild. No change in therapy. Call Mr. Bitton with the results and send results to Ladell Pier, MD  Pt informed of providers result & recommendations. Pt verbalized understanding. No further questions .

## 2018-10-01 MED FILL — LOSARTAN POTASSIUM 100 MG T: 100 | 30 days supply | Qty: 30 | Fill #0

## 2018-10-01 MED FILL — FUROSEMIDE 40 MG TAB: 40 | 60 days supply | Qty: 30 | Fill #5

## 2018-10-05 ENCOUNTER — Other Ambulatory Visit: Payer: Self-pay | Admitting: Pharmacist

## 2018-10-05 DIAGNOSIS — E785 Hyperlipidemia, unspecified: Secondary | ICD-10-CM

## 2018-10-05 DIAGNOSIS — I480 Paroxysmal atrial fibrillation: Secondary | ICD-10-CM

## 2018-10-05 MED ORDER — CARVEDILOL 12.5 MG PO TABS: 12.5000 mg | ORAL_TABLET | Freq: Two times a day (BID) | ORAL | 2 refills | Status: DC

## 2018-10-05 MED ORDER — SIMVASTATIN 20 MG PO TABS: 20.0000 mg | ORAL_TABLET | Freq: Every day | ORAL | 2 refills | Status: DC

## 2018-10-05 MED FILL — CARVEDILOL 12.5 MG TABLET: 12.5 | 30 days supply | Qty: 60 | Fill #0

## 2018-10-05 MED FILL — SIMVASTATIN 20 MG TABLET: 20 | 30 days supply | Qty: 30 | Fill #0

## 2018-10-13 NOTE — Telephone Encounter (Signed)
Spoke with Jeffrey Martin and patient approved until 08/2018 Shipment received Eliquis

## 2018-11-04 ENCOUNTER — Other Ambulatory Visit: Payer: Self-pay | Admitting: Internal Medicine

## 2018-11-04 MED FILL — SIMVASTATIN 20 MG TABLET: 20 | 30 days supply | Qty: 30 | Fill #1

## 2018-11-05 MED FILL — LOSARTAN POTASSIUM 100 MG T: 100 | 30 days supply | Qty: 30 | Fill #0

## 2018-11-12 ENCOUNTER — Ambulatory Visit: Payer: Medicare HMO | Admitting: Internal Medicine

## 2018-11-29 NOTE — Progress Notes (Signed)
Cardiology Office Note   Date:  11/29/2018   ID:  Jeffrey Martin, DOB 08-Oct-1950, MRN [id number]  PCP:  Marcine Matar, MD  Cardiologist:   Dr. Antoine Poche CC: Follow Up   History of Present Illness: Jeffrey Martin is a 68 y.o. male who presents for ongoing assessment management of cardiomyopathy and atrial fibrillation.  Other history includes previous CVA treated with anticoagulation.  He has an EF of 30% to 35%, this is complicated by alcohol and tobacco abuse.  Additionally the patient has a history of hypotension and bradycardia.  He was last seen in the office on 09/01/2018, according to Dr. Jenene Slicker office note he was doing well but had run out of his amlodipine.  He was not found to be volume overloaded.  Amlodipine was discontinued as he was found to be hypotensive.  He was again counseled on EtOH and tobacco use.  He was trying to cut down.  Echocardiogram was completed on 09/08/2018 revealing mildly reduced systolic function with an EF of 40% to 45%.  The left atrial size was mild to moderately dilated, the right atrial size was moderately dilated,  [name] is without complaint today.  He is essentially quit smoking, and drinking alcohol.  He is medically compliant.  He denies recurrent symptoms.  Past Medical History:  Diagnosis Date  . Atrial fibrillation (HCC) 07/2014  . History of cardiovascular stress test    Lexiscan Myoview 7/16:  EF 27%, possible inferoapical ischemia; High Risk; no CAD at cath  . Hyperlipidemia Dx July 2016  . Hypertension Dx June 2016  . NICM (nonischemic cardiomyopathy) (HCC) 11/2014   EF 30-35% by echo, 27% by MV, no CAD at cath  . Peptic ulcer 1988  . Stroke [facility])     Past Surgical History:  Procedure Laterality Date  . CARDIAC CATHETERIZATION N/A 10/10/2014   Procedure: Right/Left Heart Cath and Coronary Angiography;  Surgeon: Tonny Bollman, MD; normal coronary arteries, normal LVEDP, systemic hypertension   . REPAIR OF  PERFORATED ULCER       Current Outpatient Medications  Medication Sig Dispense Refill  . apixaban (ELIQUIS) 5 MG TABS tablet Take 1 tablet (5 mg total) by mouth 2 (two) times daily. 180 tablet 3  . carvedilol (COREG) 12.5 MG tablet Take 1 tablet (12.5 mg total) by mouth 2 (two) times daily with a meal. 60 tablet 2  . furosemide (LASIX) 40 MG tablet 1/2 tab PO every day PO 30 tablet 11  . losartan (COZAAR) 100 MG tablet TAKE 1 TABLET BY MOUTH DAILY. 30 tablet 0  . Multiple Vitamins-Minerals (MULTIVITAMIN & MINERAL PO) Take 1 tablet by mouth daily.    . nicotine polacrilex (COMMIT) 2 MG lozenge Take 1 lozenge (2 mg total) by mouth as needed for smoking cessation. 100 tablet 0  . simvastatin (ZOCOR) 20 MG tablet Take 1 tablet (20 mg total) by mouth daily. 30 tablet 2   No current facility-administered medications for this visit.     Allergies:   Patient has no known allergies.    Social History:  The patient  reports that he has been smoking cigarettes. He started smoking about 4 years ago. He has been smoking about 0.50 packs per day. He has quit using smokeless tobacco. He reports current alcohol use. He reports that he does not use drugs.   Family History:  The patient's family history includes Hypertension in his mother; Stroke in his maternal grandfather and mother.    ROS: All other systems are reviewed  and negative. Unless otherwise mentioned in H&P    PHYSICAL EXAM: VS:  There were no vitals taken for this visit. , BMI There is no height or weight on file to calculate BMI. GEN: Well nourished, well developed, in no acute distress HEENT: normal Neck: no JVD, carotid bruits, or masses Cardiac: RRR; no murmurs, rubs, or gallops,no edema  Respiratory:  Clear to auscultation bilaterally, normal work of breathing GI: soft, nontender, nondistended, + BS MS: no deformity or atrophy Skin: warm and dry, no rash Neuro:  Strength and sensation are intact Psych: euthymic mood, full  affect   EKG: Not completed this office visit.  Recent Labs: 01/29/2018: BUN 16; Creatinine, Ser 1.12; Potassium 4.4; Sodium 142 05/05/2018: ALT 10; Hemoglobin 13.3; Platelets 161    Lipid Panel    Component Value Date/Time   CHOL 99 (L) 05/05/2018 1437   TRIG 67 05/05/2018 1437   HDL 53 05/05/2018 1437   CHOLHDL 1.9 05/05/2018 1437   CHOLHDL 2.8 09/07/2014 0525   VLDL 10 09/07/2014 0525   LDLCALC 33 05/05/2018 1437      Wt Readings from Last 3 Encounters:  09/01/18 142 lb (64.4 kg)  05/05/18 152 lb 3.2 oz (69 kg)  02/24/18 158 lb 9.6 oz (71.9 kg)      Other studies Reviewed: Echocardiogram Oct 04, 2018 1. The left ventricle has mild-moderately reduced systolic function, with an ejection fraction of 40-45%. The cavity size was normal. Left ventricular diastolic parameters were normal. Left ventricular diffuse hypokinesis.  2. The right ventricle has normal systolic function. The cavity was normal. There is no increase in right ventricular wall thickness.  3. Left atrial size was mild-moderately dilated.  4. Right atrial size was moderately dilated.  5. The aortic valve is tricuspid. Mild thickening of the aortic valve. Mild calcification of the aortic valve.  6. No intracardiac thrombi or masses were visualized.    ASSESSMENT AND PLAN:  1.  Hypertension: Slightly elevated today.  The patient admits to dietary noncompliance with salt over the weekend.  He has been advised on a low-sodium diet and avoidance of processed meats.  May need to consider increasing carvedilol on next office visit if blood pressures persistently elevated.  We will check a BMET to evaluate kidney function.  2.  Hypercholesterolemia: He is on simvastatin 20 mg daily.  Will check fasting lipids and LFTs.  3.  Paroxysmal atrial fibrillation:: Continues on apixaban 5 mg twice daily.  Heart rate is well controlled.  Checking CBC.  4.  Polysubstance abuse: The patient has quit smoking and drinking.  He  states that he does have a rare cigarette maybe once a month.  I have congratulated him on this lifestyle change.   Current medicines are reviewed at length with the patient today.    Labs/ tests ordered today include: CBC, fasting lipids and LFTs, BMET. Phill Myron. [place] Pugh, ANP, AACC   11/29/2018 12:10 PM    [facility] [place] [address] Office [phone] Fax [phone]  Notice: This dictation was prepared with Dragon dictation along with smaller phrase technology. Any transcriptional errors that result from this process are unintentional and may not be corrected upon review.

## 2018-11-30 ENCOUNTER — Encounter: Payer: Self-pay | Admitting: Adult Health

## 2018-11-30 ENCOUNTER — Other Ambulatory Visit: Payer: Self-pay

## 2018-11-30 ENCOUNTER — Ambulatory Visit (INDEPENDENT_AMBULATORY_CARE_PROVIDER_SITE_OTHER): Payer: Medicare HMO | Admitting: Adult Health

## 2018-11-30 VITALS — BP 141/99 | HR 74 | Ht 67.0 in | Wt 145.0 lb

## 2018-11-30 DIAGNOSIS — I480 Paroxysmal atrial fibrillation: Secondary | ICD-10-CM

## 2018-11-30 DIAGNOSIS — Z79899 Other long term (current) drug therapy: Secondary | ICD-10-CM

## 2018-11-30 DIAGNOSIS — E785 Hyperlipidemia, unspecified: Secondary | ICD-10-CM | POA: Diagnosis not present

## 2018-11-30 DIAGNOSIS — I5022 Chronic systolic (congestive) heart failure: Secondary | ICD-10-CM

## 2018-11-30 MED ORDER — CARVEDILOL 12.5 MG PO TABS: 12.5000 mg | ORAL_TABLET | Freq: Two times a day (BID) | ORAL | 3 refills | Status: DC

## 2018-11-30 MED ORDER — LOSARTAN POTASSIUM 100 MG PO TABS: 100.0000 mg | ORAL_TABLET | Freq: Every day | ORAL | 3 refills | Status: DC

## 2018-11-30 MED ORDER — SIMVASTATIN 20 MG PO TABS: 20.0000 mg | ORAL_TABLET | Freq: Every day | ORAL | 3 refills | Status: DC

## 2018-11-30 MED ORDER — FUROSEMIDE 40 MG PO TABS: ORAL_TABLET | ORAL | 3 refills | Status: DC

## 2018-11-30 MED FILL — CARVEDILOL 12.5 MG TABLET: 12.5 | 90 days supply | Qty: 180 | Fill #0

## 2018-11-30 MED FILL — FUROSEMIDE 40 MG TAB: 40 | 90 days supply | Qty: 45 | Fill #0

## 2018-11-30 MED FILL — LOSARTAN POTASSIUM 100 MG T: 100 | 90 days supply | Qty: 90 | Fill #0

## 2018-11-30 MED FILL — SIMVASTATIN 20 MG TABLET: 20 | 90 days supply | Qty: 90 | Fill #0

## 2018-11-30 NOTE — Patient Instructions (Signed)
Medication Instructions:  Continue current medications  If you need a refill on your cardiac medications before your next appointment, please call your pharmacy.  Labwork: Fasting Lipid and Liver. BMP and CBC HERE IN OUR OFFICE AT [facility]   You will need to fast. DO NOT EAT OR DRINK PAST MIDNIGHT.       Take the provided lab slips with you to the lab for your blood draw.   When you have your labs (blood work) drawn today and your tests are completely normal, you will receive your results only by MyChart Message (if you have MyChart) -OR-  A paper copy in the mail.  If you have any lab test that is abnormal or we need to change your treatment, we will call you to review these results.  Testing/Procedures: None Ordered  Follow-Up: You will need a follow up appointment in 6 months.  Please call our office 2 months in advance to schedule this appointment.  You may see Dr Percival Spanish or one of the following Advanced Practice Providers on your designated Care Team:   Rosaria Ferries, PA-C . Jory Sims, DNP, ANP     At [facility], you and your health needs are our priority.  As part of our continuing mission to provide you with exceptional heart care, we have created designated Provider Care Teams.  These Care Teams include your primary Cardiologist (physician) and Advanced Practice Providers (APPs -  Physician Assistants and Nurse Practitioners) who all work together to provide you with the care you need, when you need it.  Thank you for choosing [facility] at [facility]!!

## 2019-03-03 MED FILL — LOSARTAN POTASSIUM 100 MG T: 100 | 30 days supply | Qty: 30 | Fill #1

## 2019-03-03 MED FILL — FUROSEMIDE 40 MG TAB: 40 | 30 days supply | Qty: 15 | Fill #1

## 2019-03-03 MED FILL — CARVEDILOL 12.5 MG TABLET: 12.5 | 30 days supply | Qty: 60 | Fill #1

## 2019-03-03 MED FILL — SIMVASTATIN 20 MG TABLET: 20 | 30 days supply | Qty: 30 | Fill #1

## 2019-03-22 ENCOUNTER — Encounter: Payer: Self-pay | Admitting: Internal Medicine

## 2019-03-22 ENCOUNTER — Other Ambulatory Visit: Payer: Self-pay

## 2019-03-22 ENCOUNTER — Ambulatory Visit: Payer: Medicare HMO | Attending: Internal Medicine | Admitting: Internal Medicine

## 2019-03-22 ENCOUNTER — Ambulatory Visit (HOSPITAL_BASED_OUTPATIENT_CLINIC_OR_DEPARTMENT_OTHER): Payer: Medicare HMO | Admitting: Pharmacist

## 2019-03-22 VITALS — BP 124/87 | HR 72 | Temp 98.3°F | Resp 16 | Wt 153.8 lb

## 2019-03-22 DIAGNOSIS — Z125 Encounter for screening for malignant neoplasm of prostate: Secondary | ICD-10-CM

## 2019-03-22 DIAGNOSIS — I110 Hypertensive heart disease with heart failure: Secondary | ICD-10-CM | POA: Diagnosis not present

## 2019-03-22 DIAGNOSIS — I480 Paroxysmal atrial fibrillation: Secondary | ICD-10-CM | POA: Diagnosis not present

## 2019-03-22 DIAGNOSIS — E785 Hyperlipidemia, unspecified: Secondary | ICD-10-CM

## 2019-03-22 DIAGNOSIS — K029 Dental caries, unspecified: Secondary | ICD-10-CM

## 2019-03-22 DIAGNOSIS — Z23 Encounter for immunization: Secondary | ICD-10-CM

## 2019-03-22 DIAGNOSIS — Z1211 Encounter for screening for malignant neoplasm of colon: Secondary | ICD-10-CM

## 2019-03-22 DIAGNOSIS — I509 Heart failure, unspecified: Secondary | ICD-10-CM | POA: Diagnosis not present

## 2019-03-22 DIAGNOSIS — I10 Essential (primary) hypertension: Secondary | ICD-10-CM | POA: Diagnosis not present

## 2019-03-22 DIAGNOSIS — I69354 Hemiplegia and hemiparesis following cerebral infarction affecting left non-dominant side: Secondary | ICD-10-CM | POA: Diagnosis not present

## 2019-03-22 DIAGNOSIS — F17200 Nicotine dependence, unspecified, uncomplicated: Secondary | ICD-10-CM

## 2019-03-22 MED ORDER — CARVEDILOL 12.5 MG PO TABS: 18.7500 mg | ORAL_TABLET | Freq: Two times a day (BID) | ORAL | 3 refills | Status: DC

## 2019-03-22 NOTE — Progress Notes (Signed)
Patient presents for vaccination against strep pneumo per orders of Dr. Johnson. Consent given. Counseling provided. No contraindications exists. Vaccine administered without incident.   

## 2019-03-22 NOTE — Patient Instructions (Addendum)
Please schedule an appointment with your dentist as soon as possible.   Your blood pressure is not controlled. We have increased Carvedilol to 12.5 mg taking 1.5 tablets twice per day.  Pneumococcal Polysaccharide Vaccine (PPSV23): What You Need to Know 1. Why get vaccinated? Pneumococcal polysaccharide vaccine (PPSV23) can prevent pneumococcal disease. Pneumococcal disease refers to any illness caused by pneumococcal bacteria. These bacteria can cause many types of illnesses, including pneumonia, which is an infection of the lungs. Pneumococcal bacteria are one of the most common causes of pneumonia. Besides pneumonia, pneumococcal bacteria can also cause:  Ear infections  Sinus infections  Meningitis (infection of the tissue covering the brain and spinal cord)  Bacteremia (bloodstream infection) Anyone can get pneumococcal disease, but children under 40 years of age, people with certain medical conditions, adults 65 years or older, and cigarette smokers are at the highest risk. Most pneumococcal infections are mild. However, some can result in long-term problems, such as brain damage or hearing loss. Meningitis, bacteremia, and pneumonia caused by pneumococcal disease can be fatal. 2. PPSV23 PPSV23 protects against 23 types of bacteria that cause pneumococcal disease. PPSV23 is recommended for:  All adults 65 years or older,  Anyone 2 years or older with certain medical conditions that can lead to an increased risk for pneumococcal disease. Most people need only one dose of PPSV23. A second dose of PPSV23, and another type of pneumococcal vaccine called PCV13, are recommended for certain high-risk groups. Your health care provider can give you more information. People 65 years or older should get a dose of PPSV23 even if they have already gotten one or more doses of the vaccine before they turned 54. 3. Talk with your health care provider Tell your vaccine provider if the person getting  the vaccine:  Has had an allergic reaction after a previous dose of PPSV23, or has any severe, life-threatening allergies. In some cases, your health care provider may decide to postpone PPSV23 vaccination to a future visit. People with minor illnesses, such as a cold, may be vaccinated. People who are moderately or severely ill should usually wait until they recover before getting PPSV23. Your health care provider can give you more information. 4. Risks of a vaccine reaction  Redness or pain where the shot is given, feeling tired, fever, or muscle aches can happen after PPSV23. People sometimes faint after medical procedures, including vaccination. Tell your provider if you feel dizzy or have vision changes or ringing in the ears. As with any medicine, there is a very remote chance of a vaccine causing a severe allergic reaction, other serious injury, or death. 5. What if there is a serious problem? An allergic reaction could occur after the vaccinated person leaves the clinic. If you see signs of a severe allergic reaction (hives, swelling of the face and throat, difficulty breathing, a fast heartbeat, dizziness, or weakness), call 9-1-1 and get the person to the nearest hospital. For other signs that concern you, call your health care provider. Adverse reactions should be reported to the Vaccine Adverse Event Reporting System (VAERS). Your health care provider will usually file this report, or you can do it yourself. Visit the VAERS website at www.vaers.LAgents.no or call 404 176 9469. VAERS is only for reporting reactions, and VAERS staff do not give medical advice. 6. How can I learn more?  Ask your health care provider.  Call your local or state health department.  Contact the Centers for Disease Control and Prevention (CDC): ? Call (561)164-6652 (1-800-CDC-INFO)  or ? Visit CDC's website at http://hunter.com/ CDC Vaccine Information Statement PPSV23 Vaccine (12/17/2017) This  information is not intended to replace advice given to you by your health care provider. Make sure you discuss any questions you have with your health care provider. Document Revised: 05/26/2018 Document Reviewed: 09/16/2017 Elsevier Patient Education  [place].

## 2019-03-22 NOTE — Progress Notes (Addendum)
Patient ID: Jeffrey Martin, male    DOB: 02/05/1951  MRN: [id number]  CC: Hypertension  Subjective: Jeffrey Martin is a 69 y.o. male who presents for His concerns today include: medication refills and chronic disease management.  Patient with history of HTN, HL, A. 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  1. HYPERTENSION/A.fib/NICM: Currently taking: see medication list Med Adherence: [x]  Yes    []  No Medication side effects: []  Yes    [x]  No Adherence with salt restriction: [x]  Yes    []  No Home Monitoring?: []  Yes    [x]  No Monitoring Frequency: []  Yes    [x]  No Home BP results range: []  Yes    [x]  No SOB? [x]  Yes    []  No Chest Pain?: []  Yes    [x]  No Leg swelling?: []  Yes    [x]  No Headaches?: []  Yes    [x]  No Dizziness? []  Yes    [x]  No Comments: Shortness of breath occasionally.  2. HYPERLIPIDEMIA: Last Lipid Panel results:  HDL  Date Value Ref Range Status  05/05/2018 53 >39 mg/dL Final   Triglycerides  Date Value Ref Range Status  05/05/2018 67 0 - 149 mg/dL Final    Med Adherence: [x]  Yes    []  No Medication side effects: []  Yes    [x]  No Muscle aches:  []  Yes    [x]  No Diet Adherence: [x]  Yes    []  No Comments: None  3. TOBACCO:   Reports that he smokes 1 cigarette occasionally. Not ready to quit.   4. HM: Due for pneumovax and colon cancer screening. Given fit test in the past but misplaced. Requests another one. Patient Active Problem List   Diagnosis Date Noted  . Chronic systolic CHF (congestive heart failure), NYHA class 2 (HCC) 12/27/2014  . Cerebrovascular accident (CVA) due to embolism of right middle cerebral artery (HCC) 11/24/2014  . 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 Chronic anticoagulation 11/24/2014  . Cardiomyopathy (HCC) 11/24/2014  . Chronic systolic heart failure (HCC) 10/10/2014  . Alcohol use disorder, mild,  abuse 09/13/2014  . Cardiomyopathy, ischemic   . Atrial fibrillation (HCC) 09/07/2014  . Essential hypertension   . HLD (hyperlipidemia)   . Tobacco use disorder      Current Outpatient Medications on File Prior to Visit  Medication Sig Dispense Refill  . apixaban (ELIQUIS) 5 MG TABS tablet Take 1 tablet (5 mg total) by mouth 2 (two) times daily. 180 tablet 3  . furosemide (LASIX) 40 MG tablet 1/2 tab PO every day PO 45 tablet 3  . losartan (COZAAR) 100 MG tablet Take 1 tablet (100 mg total) by mouth daily. 90 tablet 3  . Multiple Vitamins-Minerals (MULTIVITAMIN & MINERAL PO) Take 1 tablet by mouth daily.    . nicotine polacrilex (COMMIT) 2 MG lozenge Take 1 lozenge (2 mg total) by mouth as needed for smoking cessation. 100 tablet 0  . simvastatin (ZOCOR) 20 MG tablet Take 1 tablet (20 mg total) by mouth daily. 90 tablet 3   No current facility-administered medications on file prior to visit.    No Known Allergies  Social History   Socioeconomic History  . Marital status: Widowed    Spouse name: Not on file  . Number of children: Not on file  . Years of education: Not on file  . Highest education level: Not on file  Occupational  History  . Not on file  Tobacco Use  . Smoking status: Current Some Day Smoker    Packs/day: 0.50    Types: Cigarettes    Start date: 09/05/2014    Last attempt to quit: 09/05/2014    Years since quitting: 4.5  . Smokeless tobacco: Former Engineer, water and Sexual Activity  . Alcohol use: Yes    Alcohol/week: 0.0 standard drinks    Comment: rare  . Drug use: No  . Sexual activity: Not on file  Other Topics Concern  . Not on file  Social History Narrative   Patient lives with his wife   His son or daughter-in-law will accompany him to appointments    Social Determinants of Health   Financial Resource Strain:   . Difficulty of Paying Living Expenses: Not on file  Food Insecurity:   . Worried About Programme researcher, broadcasting/film/video in the Last Year: Not  on file  . Ran Out of Food in the Last Year: Not on file  Transportation Needs:   . Lack of Transportation (Medical): Not on file  . Lack of Transportation (Non-Medical): Not on file  Physical Activity:   . Days of Exercise per Week: Not on file  . Minutes of Exercise per Session: Not on file  Stress:   . Feeling of Stress : Not on file  Social Connections:   . Frequency of Communication with Friends and Family: Not on file  . Frequency of Social Gatherings with Friends and Family: Not on file  . Attends Religious Services: Not on file  . Active Member of Clubs or Organizations: Not on file  . Attends Banker Meetings: Not on file  . Marital Status: Not on file  Intimate Partner Violence:   . Fear of Current or Ex-Partner: Not on file  . Emotionally Abused: Not on file  . Physically Abused: Not on file  . Sexually Abused: Not on file    Family History  Problem Relation Age of Onset  . Hypertension Mother   . Stroke Mother   . Stroke Maternal Grandfather   . Heart attack Neg Hx     Past Surgical History:  Procedure Laterality Date  . CARDIAC CATHETERIZATION N/A 10/10/2014   Procedure: Right/Left Heart Cath and Coronary Angiography;  Surgeon: Tonny Bollman, MD; normal coronary arteries, normal LVEDP, systemic hypertension   . REPAIR OF PERFORATED ULCER      ROS: Review of Systems  Constitutional: Negative for chills and fever.  HENT: Negative for congestion, sinus pain and sore throat.   Respiratory: Negative for cough, shortness of breath and wheezing.   Cardiovascular: Positive for palpitations. Negative for chest pain and leg swelling.       Occasional palpitations  Gastrointestinal: Negative for diarrhea, nausea and vomiting.  Musculoskeletal: Negative for myalgias.  Neurological: Negative for dizziness and headaches.  Negative except as stated above  PHYSICAL EXAM: BP 124/87   Pulse 72   Temp 98.3 F (36.8 C) (Oral)   Resp 16   Wt 153 lb 12.8  oz (69.8 kg)   SpO2 96%   BMI 24.09 kg/m   Physical Exam General appearance - alert, well appearing, and in no distress and oriented to person, place, and time Mental status - alert, oriented to person, place, and time, normal mood, behavior, speech, dress, motor activity, and thought processes Eyes - pupils equal and reactive, extraocular eye movements intact Ears - bilateral TM's and external ear canals normal Nose - normal and  patent, no erythema, discharge or polyps and normal nontender sinuses Mouth - mucous membranes moist, pharynx normal without lesions, dental hygiene poor, edentulous and teeth broken in upper and lower gingivae  Chest - clear to auscultation, no wheezes, rales or rhonchi, symmetric air entry, no tachypnea, retractions or cyanosis Heart - no murmurs noted, no gallops noted, irregularly irregular rhythm with rate irregularly irregular Neurological - alert, oriented, normal speech, no focal findings or movement disorder noted, screening mental status exam normal, neck supple without rigidity, cranial nerves II through XII intact, DTR's normal and symmetric, normal muscle tone, no tremors, strength 5/5  CMP Latest Ref Rng & Units 05/05/2018 01/29/2018 11/25/2017  Glucose 65 - 99 mg/dL - 69 79  BUN 8 - 27 mg/dL - 16 14  Creatinine 0.76 - 1.27 mg/dL - 1.12 1.17  Sodium 134 - 144 mmol/L - 142 145(H)  Potassium 3.5 - 5.2 mmol/L - 4.4 3.9  Chloride 96 - 106 mmol/L - 104 105  CO2 20 - 29 mmol/L - 25 25  Calcium 8.6 - 10.2 mg/dL - 8.8 9.0  Total Protein 6.0 - 8.5 g/dL 6.6 - -  Total Bilirubin 0.0 - 1.2 mg/dL 0.3 - -  Alkaline Phos 39 - 117 IU/L 188(H) - -  AST 0 - 40 IU/L 18 - -  ALT 0 - 44 IU/L 10 - -   Lipid Panel     Component Value Date/Time   CHOL 99 (L) 05/05/2018 1437   TRIG 67 05/05/2018 1437   HDL 53 05/05/2018 1437   CHOLHDL 1.9 05/05/2018 1437   CHOLHDL 2.8 09/07/2014 0525   VLDL 10 09/07/2014 0525   LDLCALC 33 05/05/2018 1437    CBC    Component  Value Date/Time   WBC 4.4 05/05/2018 1437   WBC 5.0 09/11/2015 1021   RBC 4.53 05/05/2018 1437   RBC 5.64 09/11/2015 1021   HGB 13.3 05/05/2018 1437   HCT 40.2 05/05/2018 1437   PLT 161 05/05/2018 1437   MCV 89 05/05/2018 1437   MCH 29.4 05/05/2018 1437   MCH 30.0 09/11/2015 1021   MCHC 33.1 05/05/2018 1437   MCHC 33.2 09/11/2015 1021   RDW 13.7 05/05/2018 1437   LYMPHSABS 1.2 06/12/2017 1059   MONOABS 0.7 09/06/2014 2254   EOSABS 0.0 06/12/2017 1059   BASOSABS 0.0 06/12/2017 1059    ASSESSMENT AND PLAN:  1. Essential hypertension:  Counseled on blood pressure goal of less than 130/80, low-sodium, DASH diet, medication compliance, 150 minutes of moderate intensity exercise per week. Discussed medication compliance, adverse effects. Monitor blood pressure at home and record readings. -Blood pressure today is 124/87 and not at goal of 130/80 or less. Carvedilol will be increased from 12.5 mg by mouth twice daily to 18.75 mg by mouth twice daily.  -Continue current medications - CBC - Comprehensive metabolic panel  2. Paroxysmal atrial fibrillation ([place]):  -Continue Eliquis -Carvedilol increased to 1.5 tablets in the morning and the evening with a meal - carvedilol (COREG) 12.5 MG tablet; Take 1.5 tablets (18.75 mg total) by mouth 2 (two) times daily with a meal.  Dispense: 270 tablet; Refill: 3  3. Hyperlipidemia, unspecified hyperlipidemia type:  -Continue Simvastatin  - Lipid panel  4. Tobacco dependence: Counseled on smoking cessation.  5. Dental cavity: Dental services are recommended. Patient has a preferred dentist which he will contact.  6. Need for vaccination for Strep pneumoniae: Pneumovax23 will be administered in clinic today.  7. Colon cancer screening: - Fecal occult blood,  imunochemical  8. Prostate cancer screening: - PSA  Note: Patient's primary physician Dr. Laural Benes was present during the patient's history, physical examination, assessment, and  plan.  Patient was given the opportunity to ask questions. Patient verbalized understanding of the plan and was able to repeat key elements of the plan.   Orders Placed This Encounter  Procedures  . Fecal occult blood, imunochemical  . CBC  . Comprehensive metabolic panel  . Lipid panel  . PSA     Requested Prescriptions   Signed Prescriptions Disp Refills  . carvedilol (COREG) 12.5 MG tablet 270 tablet 3    Sig: Take 1.5 tablets (18.75 mg total) by mouth 2 (two) times daily with a meal.    Return in about 4 months (around 07/20/2019) for Dr. Laural Benes.  Rema Fendt, NP

## 2019-03-23 LAB — COMPREHENSIVE METABOLIC PANEL
ALT: 11 IU/L (ref 0–44)
AST: 21 IU/L (ref 0–40)
Albumin/Globulin Ratio: 1.3 (ref 1.2–2.2)
Albumin: 4.1 g/dL (ref 3.8–4.8)
Alkaline Phosphatase: 140 IU/L — ABNORMAL HIGH (ref 39–117)
BUN/Creatinine Ratio: 9 — ABNORMAL LOW (ref 10–24)
BUN: 13 mg/dL (ref 8–27)
Bilirubin Total: 0.7 mg/dL (ref 0.0–1.2)
CO2: 27 mmol/L (ref 20–29)
Calcium: 9.5 mg/dL (ref 8.6–10.2)
Chloride: 105 mmol/L (ref 96–106)
Creatinine, Ser: 1.45 mg/dL — ABNORMAL HIGH (ref 0.76–1.27)
GFR calc Af Amer: 58 mL/min/{1.73_m2} — ABNORMAL LOW (ref >59)
GFR calc non Af Amer: 50 mL/min/{1.73_m2} — ABNORMAL LOW (ref >59)
Globulin, Total: 3.1 g/dL (ref 1.5–4.5)
Glucose: 74 mg/dL (ref 65–99)
Potassium: 4.8 mmol/L (ref 3.5–5.2)
Sodium: 143 mmol/L (ref 134–144)
Total Protein: 7.2 g/dL (ref 6.0–8.5)

## 2019-03-23 LAB — CBC
Hematocrit: 50.2 % (ref 37.5–51.0)
Hemoglobin: 16.7 g/dL (ref 13.0–17.7)
MCH: 30.3 pg (ref 26.6–33.0)
MCHC: 33.3 g/dL (ref 31.5–35.7)
MCV: 91 fL (ref 79–97)
Platelets: 172 10*3/uL (ref 150–450)
RBC: 5.52 x10E6/uL (ref 4.14–5.80)
RDW: 13.5 % (ref 11.6–15.4)
WBC: 4.6 10*3/uL (ref 3.4–10.8)

## 2019-03-23 LAB — LIPID PANEL
Chol/HDL Ratio: 2.1 ratio (ref 0.0–5.0)
Cholesterol, Total: 107 mg/dL (ref 100–199)
HDL: 51 mg/dL (ref >39)
LDL Chol Calc (NIH): 41 mg/dL (ref 0–99)
Triglycerides: 74 mg/dL (ref 0–149)
VLDL Cholesterol Cal: 15 mg/dL (ref 5–40)

## 2019-03-23 LAB — PSA: Prostate Specific Ag, Serum: 3.4 ng/mL (ref 0.0–4.0)

## 2019-03-24 ENCOUNTER — Telehealth: Payer: Self-pay | Admitting: Internal Medicine

## 2019-03-26 NOTE — Telephone Encounter (Signed)
Contacted pt to go over lab results pt is aware and doesn't have any questions or concerns 

## 2019-04-22 MED FILL — SIMVASTATIN 20 MG TABLET: 20 | 30 days supply | Qty: 30 | Fill #2

## 2019-04-22 MED FILL — LOSARTAN POTASSIUM 100 MG T: 100 | 30 days supply | Qty: 30 | Fill #2

## 2019-05-27 MED FILL — SIMVASTATIN 20 MG TABLET: 20 | 30 days supply | Qty: 30 | Fill #3

## 2019-05-27 MED FILL — FUROSEMIDE 40 MG TAB: 40 | 30 days supply | Qty: 15 | Fill #2

## 2019-05-27 MED FILL — LOSARTAN POTASSIUM 100 MG T: 100 | 30 days supply | Qty: 30 | Fill #3

## 2019-05-27 MED FILL — CARVEDILOL 12.5 MG TABLET: 12.5 | 30 days supply | Qty: 60 | Fill #2

## 2019-06-21 MED FILL — FUROSEMIDE 40 MG TAB: 40 | 30 days supply | Qty: 15 | Fill #3

## 2019-06-21 MED FILL — SIMVASTATIN 20 MG TABLET: 20 | 30 days supply | Qty: 30 | Fill #4

## 2019-06-21 MED FILL — CARVEDILOL 12.5 MG TABLET: 12.5 | 30 days supply | Qty: 60 | Fill #3

## 2019-06-21 MED FILL — LOSARTAN POTASSIUM 100 MG T: 100 | 30 days supply | Qty: 30 | Fill #4

## 2019-07-22 ENCOUNTER — Other Ambulatory Visit: Payer: Self-pay

## 2019-07-22 ENCOUNTER — Ambulatory Visit: Payer: Medicare HMO | Attending: Internal Medicine | Admitting: Internal Medicine

## 2019-07-22 DIAGNOSIS — I480 Paroxysmal atrial fibrillation: Secondary | ICD-10-CM | POA: Diagnosis not present

## 2019-07-22 DIAGNOSIS — I10 Essential (primary) hypertension: Secondary | ICD-10-CM

## 2019-07-22 DIAGNOSIS — E785 Hyperlipidemia, unspecified: Secondary | ICD-10-CM

## 2019-07-22 DIAGNOSIS — Z1211 Encounter for screening for malignant neoplasm of colon: Secondary | ICD-10-CM

## 2019-07-22 DIAGNOSIS — N289 Disorder of kidney and ureter, unspecified: Secondary | ICD-10-CM | POA: Diagnosis not present

## 2019-07-22 DIAGNOSIS — F17200 Nicotine dependence, unspecified, uncomplicated: Secondary | ICD-10-CM | POA: Diagnosis not present

## 2019-07-22 DIAGNOSIS — Z23 Encounter for immunization: Secondary | ICD-10-CM

## 2019-07-22 NOTE — Progress Notes (Signed)
Virtual Visit via Telephone Note Due to current restrictions/limitations of in-office visits due to the COVID-19 pandemic, this scheduled clinical appointment was converted to a telehealth visit  I connected with Jeffrey Martin on 07/22/19 at 2:28 p.m by telephone and verified that I am speaking with the correct person using two identifiers. I am in my office.  The patient is at home.  Only the patient and myself participated in this encounter.  I discussed the limitations, risks, security and privacy concerns of performing an evaluation and management service by telephone and the availability of in person appointments. I also discussed with the patient that there may be a patient responsible charge related to this service. The patient expressed understanding and agreed to proceed.   History of Present Illness: Patient with history of HTN, HL, A. 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 Patient last seen 03/22/2019. Purpose of today's visit is chronic disease management.  HM:  Did not get COVID vaccine as yet but plans to get it. Given FIT test on last visit.  States he moved 2 x since last visit.  Still has FIT test at home.  HYPERTENSION/ a.fib Currently taking: see medication list Med Adherence: [x]  Yes    []  No Medication side effects: []  Yes    [x]  No Adherence with salt restriction: [x]  Yes    []  No Home Monitoring?: []  Yes    [x]  No -no device to check Monitoring Frequency: []  Yes    []  No Home BP results range: []  Yes    []  No SOB? []  Yes    [x]  No Chest Pain?: []  Yes    [x]  No Leg swelling?: []  Yes    [x]  No Headaches?: []  Yes    [x]  No Dizziness? []  Yes    [x]  No Comments: no palpitations, PND, orthopnea.  No bruising or bleeding  Tob dep:  Smoking 1 pk Q 10 days.  States he still plans to quit but declines any meds to help him. Plan to quit before next office visit  Kidney function was not 100% on blood  test done on last visit.  He is not taking any OTC pain med   Outpatient Encounter Medications as of 07/22/2019  Medication Sig  . apixaban (ELIQUIS) 5 MG TABS tablet Take 1 tablet (5 mg total) by mouth 2 (two) times daily.  . carvedilol (COREG) 12.5 MG tablet Take 1.5 tablets (18.75 mg total) by mouth 2 (two) times daily with a meal.  . furosemide (LASIX) 40 MG tablet 1/2 tab PO every day PO  . losartan (COZAAR) 100 MG tablet Take 1 tablet (100 mg total) by mouth daily.  . Multiple Vitamins-Minerals (MULTIVITAMIN & MINERAL PO) Take 1 tablet by mouth daily.  . nicotine polacrilex (COMMIT) 2 MG lozenge Take 1 lozenge (2 mg total) by mouth as needed for smoking cessation.  . simvastatin (ZOCOR) 20 MG tablet Take 1 tablet (20 mg total) by mouth daily.   No facility-administered encounter medications on file as of 07/22/2019.    Observations/Objective: Results for orders placed or performed in visit on 03/22/19  CBC  Result Value Ref Range   WBC 4.6 3.4 - 10.8 x10E3/uL   RBC 5.52 4.14 - 5.80 x10E6/uL   Hemoglobin 16.7 13.0 - 17.7 g/dL   Hematocrit 50.2 37.5 - 51.0 %   MCV 91 79 - 97 fL   MCH 30.3 26.6 - 33.0 pg   MCHC 33.3  31.5 - 35.7 g/dL   RDW 24.4 01.0 - 27.2 %   Platelets 172 150 - 450 x10E3/uL  Comprehensive metabolic panel  Result Value Ref Range   Glucose 74 65 - 99 mg/dL   BUN 13 8 - 27 mg/dL   Creatinine, Ser 5.36 (H) 0.76 - 1.27 mg/dL   GFR calc non Af Amer 50 (L) >59 mL/min/1.73   GFR calc Af Amer 58 (L) >59 mL/min/1.73   BUN/Creatinine Ratio 9 (L) 10 - 24   Sodium 143 134 - 144 mmol/L   Potassium 4.8 3.5 - 5.2 mmol/L   Chloride 105 96 - 106 mmol/L   CO2 27 20 - 29 mmol/L   Calcium 9.5 8.6 - 10.2 mg/dL   Total Protein 7.2 6.0 - 8.5 g/dL   Albumin 4.1 3.8 - 4.8 g/dL   Globulin, Total 3.1 1.5 - 4.5 g/dL   Albumin/Globulin Ratio 1.3 1.2 - 2.2   Bilirubin Total 0.7 0.0 - 1.2 mg/dL   Alkaline Phosphatase 140 (H) 39 - 117 IU/L   AST 21 0 - 40 IU/L   ALT 11 0 - 44 IU/L   Lipid panel  Result Value Ref Range   Cholesterol, Total 107 100 - 199 mg/dL   Triglycerides 74 0 - 149 mg/dL   HDL 51 >64 mg/dL   VLDL Cholesterol Cal 15 5 - 40 mg/dL   LDL Chol Calc (NIH) 41 0 - 99 mg/dL   Chol/HDL Ratio 2.1 0.0 - 5.0 ratio  PSA  Result Value Ref Range   Prostate Specific Ag, Serum 3.4 0.0 - 4.0 ng/mL     Assessment and Plan: 1. Essential hypertension Patient to continue carvedilol and Cozaar.  2. Paroxysmal atrial fibrillation (HCC) Continue Eliquis and Coreg  3. Hyperlipidemia, unspecified hyperlipidemia type Continue simvastatin  4. Tobacco dependence Continue to encourage him toward smoking cessation.  Encouraged him to set a quit date.  5. Renal insufficiency Avoid NSAIDs.  He will come to the lab for recheck of kidney function - Basic Metabolic Panel; Future  6. Screening for colon cancer Encourage patient to complete the fit test and mail it in  7. High priority for COVID-19 virus vaccination Discussed and encouraged him to have COVID-19 vaccine.  Patient informed that vaccines are relatively safe and effective   Follow Up Instructions: 3 mths   I discussed the assessment and treatment plan with the patient. The patient was provided an opportunity to ask questions and all were answered. The patient agreed with the plan and demonstrated an understanding of the instructions.   The patient was advised to call back or seek an in-person evaluation if the symptoms worsen or if the condition fails to improve as anticipated.  I provided 8 minutes of non-face-to-face time during this encounter.   Jonah Blue, MD

## 2019-08-11 MED FILL — SIMVASTATIN 20 MG TABLET: 20 | 30 days supply | Qty: 30 | Fill #5

## 2019-08-11 MED FILL — CARVEDILOL 12.5 MG TABLET: 12.5 | 30 days supply | Qty: 90 | Fill #0

## 2019-08-11 MED FILL — LOSARTAN POTASSIUM 100 MG T: 100 | 30 days supply | Qty: 30 | Fill #5

## 2019-08-11 MED FILL — FUROSEMIDE 40 MG TAB: 40 | 30 days supply | Qty: 15 | Fill #4

## 2019-08-21 ENCOUNTER — Other Ambulatory Visit: Payer: Self-pay | Admitting: Cardiology

## 2019-08-21 DIAGNOSIS — I480 Paroxysmal atrial fibrillation: Secondary | ICD-10-CM

## 2019-08-24 NOTE — Telephone Encounter (Signed)
68 M 69.8 kg, SCr 1.45 (2/21), LOV [place] 10/20

## 2019-09-20 MED FILL — FUROSEMIDE 40 MG TAB: 40 | 30 days supply | Qty: 15 | Fill #5

## 2019-09-20 MED FILL — SIMVASTATIN 20 MG TABLET: 20 | 30 days supply | Qty: 30 | Fill #6

## 2019-09-20 MED FILL — LOSARTAN POTASSIUM 100 MG T: 100 | 30 days supply | Qty: 30 | Fill #6

## 2019-09-28 ENCOUNTER — Ambulatory Visit: Payer: Medicare HMO | Admitting: Internal Medicine

## 2019-10-04 ENCOUNTER — Ambulatory Visit: Payer: Medicare HMO | Attending: Family | Admitting: Family

## 2019-10-04 ENCOUNTER — Encounter: Payer: Self-pay | Admitting: Family

## 2019-10-04 ENCOUNTER — Other Ambulatory Visit: Payer: Self-pay

## 2019-10-04 VITALS — BP 123/86 | HR 73 | Temp 97.8°F | Resp 16 | Ht 69.0 in | Wt 148.8 lb

## 2019-10-04 DIAGNOSIS — Z0000 Encounter for general adult medical examination without abnormal findings: Secondary | ICD-10-CM

## 2019-10-04 DIAGNOSIS — Z0120 Encounter for dental examination and cleaning without abnormal findings: Secondary | ICD-10-CM

## 2019-10-04 DIAGNOSIS — Z5320 Procedure and treatment not carried out because of patient's decision for unspecified reasons: Secondary | ICD-10-CM

## 2019-10-04 DIAGNOSIS — Z720 Tobacco use: Secondary | ICD-10-CM

## 2019-10-04 NOTE — Progress Notes (Signed)
Subjective:   Jeffrey Martin is a 69 y.o. male who presents for an Initial Medicare Annual Wellness Visit.  Review of Systems      Objective:    Today's Vitals   10/04/19 1354  BP: 123/86  Pulse: 73  Resp: 16  Temp: 97.8 F (36.6 C)  SpO2: 97%  Weight: 148 lb 12.8 oz (67.5 kg)  Height: 5\' 9"  (1.753 m)   Body mass index is 21.97 kg/m.  Advanced Directives 10/04/2019 07/22/2016 11/22/2014 10/10/2014 09/13/2014 09/06/2014  Does Patient Have a Medical Advance Directive? No;Yes No No Yes No No  Type of Advance Directive Healthcare Power of Attorney - - - - -  Does patient want to make changes to medical advance directive? - - - No - Patient declined - -  Copy of Healthcare Power of Attorney in Chart? No - copy requested - - - - -  Would patient like information on creating a medical advance directive? No - Patient declined - No - patient declined information - No - patient declined information No - patient declined information   Discussed with patient what is a 09/08/2014.  I went over with him Living Will and Healthcare Power of [place]. Patient given written packet also.  He will look over it and consider executing a Living Will. If he does, I requested that he/she brings a copy of it for our records.  Current Medications (verified) Outpatient Encounter Medications as of 10/04/2019  Medication Sig  . carvedilol (COREG) 12.5 MG tablet Take 1.5 tablets (18.75 mg total) by mouth 2 (two) times daily with a meal.  . ELIQUIS 5 MG TABS tablet TAKE 1 TABLET BY MOUTH TWICE DAILY  . furosemide (LASIX) 40 MG tablet 1/2 tab PO every day PO  . losartan (COZAAR) 100 MG tablet Take 1 tablet (100 mg total) by mouth daily.  . Multiple Vitamins-Minerals (MULTIVITAMIN & MINERAL PO) Take 1 tablet by mouth daily.  . nicotine polacrilex (COMMIT) 2 MG lozenge Take 1 lozenge (2 mg total) by mouth as needed for smoking cessation.  . simvastatin (ZOCOR) 20 MG tablet Take 1 tablet (20 mg  total) by mouth daily.   No facility-administered encounter medications on file as of 10/04/2019.    Allergies (verified) Patient has no known allergies.   History: Past Medical History:  Diagnosis Date  . Atrial fibrillation (HCC) 07/2014  . History of cardiovascular stress test    Lexiscan Myoview 7/16:  EF 27%, possible inferoapical ischemia; High Risk; no CAD at cath  . Hyperlipidemia Dx July 2016  . Hypertension Dx June 2016  . NICM (nonischemic cardiomyopathy) (HCC) 11/2014   EF 30-35% by echo, 27% by MV, no CAD at cath  . Peptic ulcer 1988  . Stroke [facility])    Past Surgical History:  Procedure Laterality Date  . CARDIAC CATHETERIZATION N/A 10/10/2014   Procedure: Right/Left Heart Cath and Coronary Angiography;  Surgeon: 10/12/2014, MD; normal coronary arteries, normal LVEDP, systemic hypertension   . REPAIR OF PERFORATED ULCER     Family History  Problem Relation Age of Onset  . Hypertension Mother   . Stroke Mother   . Stroke Maternal Grandfather   . Heart attack Neg Hx    Social History   Socioeconomic History  . Marital status: Widowed    Spouse name: Not on file  . Number of children: Not on file  . Years of education: Not on file  . Highest education level: Not on file  Occupational History  . Not on file  Tobacco Use  . Smoking status: Current Some Day Smoker    Packs/day: 0.50    Types: Cigarettes    Start date: 09/05/2014    Last attempt to quit: 09/05/2014    Years since quitting: 5.0  . Smokeless tobacco: Former Engineer, water and Sexual Activity  . Alcohol use: Yes    Alcohol/week: 0.0 standard drinks    Comment: rare  . Drug use: No  . Sexual activity: Not on file  Other Topics Concern  . Not on file  Social History Narrative   Patient lives with his wife   His son or daughter-in-law will accompany him to appointments    Social Determinants of Health   Financial Resource Strain:   . Difficulty of Paying Living Expenses:   Food  Insecurity:   . Worried About Programme researcher, broadcasting/film/video in the Last Year:   . Barista in the Last Year:   Transportation Needs:   . Freight forwarder (Medical):   Marland Kitchen Lack of Transportation (Non-Medical):   Physical Activity:   . Days of Exercise per Week:   . Minutes of Exercise per Session:   Stress:   . Feeling of Stress :   Social Connections:   . Frequency of Communication with Friends and Family:   . Frequency of Social Gatherings with Friends and Family:   . Attends Religious Services:   . Active Member of Clubs or Organizations:   . Attends Banker Meetings:   Marland Kitchen Marital Status:     Tobacco Counseling Ready to quit: Not Answered Counseling given: Not Answered  Reports smoking for at least 40 years. Reports at his heaviest he smoked around 1 pack daily. Reports he currently smokes about 1 pack per month.   Clinical Intake: Pain : No/denies pain  Diabetes: No  Diabetic? No   Activities of Daily Living In your present state of health, do you have any difficulty performing the following activities: 10/04/2019  Vision? N  Difficulty concentrating or making decisions? Y  Walking or climbing stairs? Y  Dressing or bathing? N  Doing errands, shopping? N  Preparing Food and eating ? N  Using the Toilet? N  In the past six months, have you accidently leaked urine? N  Do you have problems with loss of bowel control? N  Managing your Medications? N  Managing your Finances? N  Housekeeping or managing your Housekeeping? N  Some recent data might be hidden   Denies difficulty concentrating and making decisions, reports he misread the question.  Denies difficulty and climbing stairs, reports he misread the question.   Patient Care Team: Marcine Matar, MD as PCP - General (Internal Medicine)  Rollene Rotunda, MD as Consulting Physician (Cardiologist)   Indicate any recent Medical Services you may have received from other than [facility] providers in  the past year (date may be approximate).     Assessment:   This is a routine wellness examination for Jeffrey Martin.  Hearing/Vision screen  Hearing Screening   125Hz  250Hz  500Hz  1000Hz  2000Hz  3000Hz  4000Hz  6000Hz  8000Hz   Right ear:           Left ear:             Visual Acuity Screening   Right eye Left eye Both eyes  Without correction: 20 30 20 30 20 30   With correction:       Reports he has not seen eye doctor  for some time. Declines referral.    Whisper Test normal bilateral.    Goals   None   "I would like to quit smoking." Reports he is not interested in smoking cessation at this time.   Depression Screen PHQ 2/9 Scores 10/04/2019 03/22/2019 01/29/2018 12/30/2017 11/25/2017 09/29/2017 06/12/2017  PHQ - 2 Score 0 0 0 - 0 0 2  PHQ- 9 Score - - - - - - 7  Exception Documentation - - - Patient refusal - - -    Fall Risk Fall Risk  10/04/2019 03/22/2019 07/22/2016 03/27/2015 11/22/2014  Falls in the past year? 0 0 No No No  Number falls in past yr: 0 - - - -  Injury with Fall? 0 - - - -    Any stairs in or around the home? Yes  6 steps outside with handrail. None inside.  If so, are there any without handrails? No  Home free of loose throw rugs in walkways, pet beds, electrical cords, etc? Yes  Adequate lighting in your home to reduce risk of falls? Yes   ASSISTIVE DEVICES UTILIZED TO PREVENT FALLS: Life alert? No  Use of a cane, walker or w/c? Yes  Grab bars in the bathroom? No  Shower chair or bench in shower? Yes  Elevated toilet seat or a handicapped toilet? No   TIMED UP AND GO: Was the test performed? Yes .  Length of time to ambulate 10 feet: 12 sec.   Gait slow and steady without use of assistive device  Cognitive Function: MMSE - Mini Mental State Exam 10/04/2019  Orientation to time 5  Orientation to Place 5  Registration 3  Attention/ Calculation 5  Recall 1  Language- name 2 objects 2  Language- repeat 1  Language- follow 3 step command 3  Language- read &  follow direction 1  Write a sentence 0  Copy design 0  Total score 26    Immunizations Immunization History  Administered Date(s) Administered  . Influenza,inj,Quad PF,6+ Mos 11/22/2014  . Pneumococcal Conjugate-13 12/30/2017  . Pneumococcal Polysaccharide-23 03/22/2019  . Tdap 07/22/2016    TDAP status: Up to date Flu Vaccine status: Up to date Pneumococcal vaccine status: Up to date Covid-19 vaccine status: Declined, Education has been provided regarding the importance of this vaccine but patient still declined. Advised may receive this vaccine at local pharmacy or Health Dept.or vaccine clinic. Aware to provide a copy of the vaccination record if obtained from local pharmacy or Health Dept. Verbalized acceptance and understanding. Reports not interested at the moment.  Qualifies for Shingles Vaccine? No     Screening Tests Health Maintenance  Topic Date Due  . COVID-19 Vaccine (1) Never done  . COLONOSCOPY  Never done  . INFLUENZA VACCINE  09/19/2019  . TETANUS/TDAP  07/23/2026  . Hepatitis C Screening  Completed  . PNA vac Low Risk Adult  Completed    Health Maintenance  Health Maintenance Due  Topic Date Due  . COVID-19 Vaccine (1) Never done  . COLONOSCOPY  Never done  . INFLUENZA VACCINE  09/19/2019    Colorectal cancer screening: Completed patient has never had this and prefers to wait. Repeat every 10 years. Declines referral states he will let primary physician know in future if he changes his mind.   Lung Cancer Screening: (Low Dose CT Chest recommended if Age 68-80 years, 30 pack-year currently smoking OR have quit w/in 15years.) does qualify.   Lung Cancer Screening Referral: Patient declines.  Additional Screening:  Hepatitis C Screening: does not qualify; Completed 11/22/2014  Vision Screening: Recommended annual ophthalmology exams for early detection of glaucoma and other disorders of the eye. Is the patient up to date with their annual eye exam?   No  Who is the provider or what is the name of the office in which the patient attends annual eye exams? None If pt is not established with a provider, would they like to be referred to a provider to establish care? No .   Dental Screening: Recommended annual dental exams for proper oral hygiene.States he has teeth issues and would like them fixed especially lower bilateral back area. Denies use of dentures and partials. Would like referral to dentistry Referral placed during today's visit.   Community Resource Referral / Chronic Care Management: CRR required this visit?  No   CCM required this visit?  No      Plan:  1. Encounter for Medicare annual wellness exam: - Patient presents for Medicare annual wellness exam.  - Mini Mental State Exam total score 26/30. Will continue to monitor with primary physician.  - Declines interest in Covid-19 vaccines.  - Patient due for eye examination. Declines referral to Ophthalmologist for eye examination. - Declines colorectal cancer screening. - Discussed with patient what is a Stage manager. I went over with him Living Will and Healthcare Power of [place]. Patient given written packet also. He will look over it and consider executing a Living Will. If he does, I requested that he/she brings a copy of it for our records.  2. Encounter for routine dental examination: - Referral to Dentistry for routine dental examination and further management. Reports teeth are bothersome on bilateral lowers. - Ambulatory referral to Dentistry  3. Declined smoking cessation: - Counseled to quit.  Discussed health risk associated with smoking and cessation methods including NRT, Chantix, Buproprion.  - Patient reports he is not ready to quit at this time.  4. Lung cancer screening declined by patient: - Patient with greater than 36 pack year history.  - Declines lung cancer screening at this time.  I have personally reviewed and noted the  following in the patient's chart:   . Medical and social history . Use of alcohol, tobacco or illicit drugs  . Current medications and supplements . Functional ability and status . Nutritional status . Physical activity . Advanced directives . List of other physicians . Hospitalizations, surgeries, and ER visits in previous 12 months . Vitals . Screenings to include cognitive, depression, and falls . Referrals and appointments  In addition, I have reviewed and discussed with patient certain preventive protocols, quality metrics, and best practice recommendations. A written personalized care plan for preventive services as well as general preventive health recommendations were provided to patient.     Rema Fendt, NP   10/04/2019

## 2019-10-04 NOTE — Patient Instructions (Addendum)
  Jeffrey Martin , Thank you for taking time to come for your Medicare Wellness Visit. I appreciate your ongoing commitment to your health goals. Please review the following plan we discussed and let me know if I can assist you in the future.   These are the goals we discussed: Goals   None   Patient states "I would like to quit smoking."    This is a list of the screening recommended for you and due dates:  Health Maintenance  Topic Date Due  . COVID-19 Vaccine (1) Never done  . Colon Cancer Screening  Never done  . Flu Shot  09/19/2019  . Tetanus Vaccine  07/23/2026  .  Hepatitis C: One time screening is recommended by Center for Disease Control  (CDC) for  adults born from [birth year] through 1963.   Completed  . Pneumonia vaccines  Completed

## 2019-10-28 ENCOUNTER — Ambulatory Visit: Payer: Medicare HMO | Admitting: Internal Medicine

## 2019-10-29 MED FILL — LOSARTAN POTASSIUM 100 MG T: 100 | 30 days supply | Qty: 30 | Fill #7

## 2019-10-29 MED FILL — FUROSEMIDE 40 MG TAB: 40 | 30 days supply | Qty: 15 | Fill #6

## 2019-10-29 MED FILL — SIMVASTATIN 20 MG TABLET: 20 | 30 days supply | Qty: 30 | Fill #7

## 2019-12-10 ENCOUNTER — Telehealth: Payer: Self-pay | Admitting: Internal Medicine

## 2019-12-10 ENCOUNTER — Other Ambulatory Visit: Payer: Self-pay | Admitting: Internal Medicine

## 2019-12-10 DIAGNOSIS — E785 Hyperlipidemia, unspecified: Secondary | ICD-10-CM

## 2019-12-10 DIAGNOSIS — F17200 Nicotine dependence, unspecified, uncomplicated: Secondary | ICD-10-CM

## 2019-12-10 DIAGNOSIS — I5022 Chronic systolic (congestive) heart failure: Secondary | ICD-10-CM

## 2019-12-10 MED ORDER — FUROSEMIDE 40 MG PO TABS: ORAL_TABLET | ORAL | 0 refills | Status: DC

## 2019-12-10 MED ORDER — SIMVASTATIN 20 MG PO TABS: 20.0000 mg | ORAL_TABLET | Freq: Every day | ORAL | 0 refills | Status: DC

## 2019-12-10 MED ORDER — NICOTINE POLACRILEX 2 MG MT LOZG: 2.0000 mg | LOZENGE | OROMUCOSAL | 0 refills | Status: DC | PRN

## 2019-12-10 MED ORDER — LOSARTAN POTASSIUM 100 MG PO TABS: 100.0000 mg | ORAL_TABLET | Freq: Every day | ORAL | 0 refills | Status: DC

## 2019-12-10 NOTE — Telephone Encounter (Signed)
Luke could you fill if appropriate  

## 2019-12-10 NOTE — Telephone Encounter (Signed)
Pt requesting refills of the following medications.    furosemide (LASIX) 40 MG tablet [[id number]]   losartan (COZAAR) 100 MG tablet [[id number]]   Multiple Vitamins-Minerals (MULTIVITAMIN & MINERAL PO) [[id number]]   nicotine polacrilex (COMMIT) 2 MG lozenge [[id number]]   simvastatin (ZOCOR) 20 MG tablet [[id number]]    Please advise and thank you

## 2019-12-10 NOTE — Telephone Encounter (Signed)
Rx sent 

## 2019-12-10 NOTE — Telephone Encounter (Signed)
   Notes to clinic: review for refill Written by a different provider    Requested Prescriptions  Pending Prescriptions Disp Refills   losartan (COZAAR) 100 MG tablet [Pharmacy Med Name: LOSARTAN POTASSIUM 100 MG T 100 Tablet] 30 tablet 3    Sig: Take 1 tablet (100 mg total) by mouth daily.      There is no refill protocol information for this order      furosemide (LASIX) 40 MG tablet [Pharmacy Med Name: FUROSEMIDE 40 MG TAB 40 Tablet] 15 tablet 3    Sig: TAKE 1/2 TABLET BY MOUTH ONCE DAILY      There is no refill protocol information for this order      simvastatin (ZOCOR) 20 MG tablet [Pharmacy Med Name: SIMVASTATIN 20 MG TABLET 20 Tablet] 30 tablet 3    Sig: Take 1 tablet (20 mg total) by mouth daily.      There is no refill protocol information for this order

## 2019-12-13 MED FILL — SIMVASTATIN 20 MG TABLET: 20 | 30 days supply | Qty: 30 | Fill #0

## 2019-12-13 MED FILL — FUROSEMIDE 40 MG TAB: 40 | 30 days supply | Qty: 15 | Fill #0

## 2019-12-13 MED FILL — LOSARTAN POTASSIUM 100 MG T: 100 | 30 days supply | Qty: 30 | Fill #0

## 2019-12-21 ENCOUNTER — Other Ambulatory Visit: Payer: Self-pay | Admitting: Internal Medicine

## 2019-12-21 ENCOUNTER — Other Ambulatory Visit: Payer: Self-pay

## 2019-12-21 ENCOUNTER — Encounter: Payer: Self-pay | Admitting: Internal Medicine

## 2019-12-21 ENCOUNTER — Ambulatory Visit: Payer: Medicare HMO | Attending: Internal Medicine | Admitting: Internal Medicine

## 2019-12-21 VITALS — BP 180/100 | HR 75 | Resp 16 | Wt 153.0 lb

## 2019-12-21 DIAGNOSIS — E785 Hyperlipidemia, unspecified: Secondary | ICD-10-CM

## 2019-12-21 DIAGNOSIS — Z1211 Encounter for screening for malignant neoplasm of colon: Secondary | ICD-10-CM | POA: Diagnosis not present

## 2019-12-21 DIAGNOSIS — I480 Paroxysmal atrial fibrillation: Secondary | ICD-10-CM

## 2019-12-21 DIAGNOSIS — I5022 Chronic systolic (congestive) heart failure: Secondary | ICD-10-CM | POA: Diagnosis not present

## 2019-12-21 DIAGNOSIS — F17200 Nicotine dependence, unspecified, uncomplicated: Secondary | ICD-10-CM

## 2019-12-21 DIAGNOSIS — F17210 Nicotine dependence, cigarettes, uncomplicated: Secondary | ICD-10-CM

## 2019-12-21 DIAGNOSIS — Z23 Encounter for immunization: Secondary | ICD-10-CM | POA: Diagnosis not present

## 2019-12-21 DIAGNOSIS — D6869 Other thrombophilia: Secondary | ICD-10-CM

## 2019-12-21 DIAGNOSIS — I10 Essential (primary) hypertension: Secondary | ICD-10-CM

## 2019-12-21 DIAGNOSIS — Z2821 Immunization not carried out because of patient refusal: Secondary | ICD-10-CM

## 2019-12-21 MED ORDER — FUROSEMIDE 40 MG PO TABS: ORAL_TABLET | ORAL | 11 refills | Status: DC

## 2019-12-21 MED ORDER — AMLODIPINE BESYLATE 5 MG PO TABS: 5.0000 mg | ORAL_TABLET | Freq: Every day | ORAL | 3 refills | Status: DC

## 2019-12-21 MED ORDER — SIMVASTATIN 20 MG PO TABS: 20.0000 mg | ORAL_TABLET | Freq: Every day | ORAL | 0 refills | Status: DC

## 2019-12-21 MED ORDER — LOSARTAN POTASSIUM 100 MG PO TABS
100.0000 mg | ORAL_TABLET | Freq: Every day | ORAL | 0 refills | Status: DC
Start: 2019-12-21 — End: 2020-03-03

## 2019-12-21 MED FILL — AMLODIPINE BESYLATE 5 MG TA: 5 | 90 days supply | Qty: 90 | Fill #0

## 2019-12-21 NOTE — Patient Instructions (Signed)
Your blood pressure is not at goal.  We have added a medication called Amlodipine 5 mg daily to help better control your blood pressure.

## 2019-12-21 NOTE — Progress Notes (Signed)
Patient ID: Jeffrey Martin, male    DOB: 04-Sep-1950  MRN: [id number]  CC: Hypertension   Subjective: Jeffrey Martin is a 69 y.o. male who presents for chronic ds management His concerns today include:  Patient with history of HTN, HL, A. 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  He is on carvedilol, Cozaar, furosemide and Eliquis.  He has his medications with him today. Med Adherence: '[x]'  Yes    '[]'  No Medication side effects: '[]'  Yes    '[x]'  No Adherence with salt restriction: '[x]'  Yes    '[]'  No Home Monitoring?: '[]'  Yes    '[x]'  No Monitoring Frequency: '[]'  Yes    '[]'  No Home BP results range: '[]'  Yes    '[]'  No SOB? '[x]'  Yes   When walking up hills Chest Pain?: '[]'  Yes    '[x]'  No Leg swelling?: '[]'  Yes    '[x]'  No Headaches?: '[]'  Yes    '[x]'  No Dizziness? '[]'  Yes    '[x]'  No Comments: No palpitations.   No bruising or bleeding.  over due for f/u with Dr. Percival Spanish  HL:  Taking and tolerating Simvastatin  Tob Dep: Reports that he is still trying to quit.  1 pk last 2-3 wks.   HM:  Need for flu shot. Did not get COVID vaccine not made up his mind to get it.  Never turned in kit for FIT. Marland Kitchen Patient Active Problem List   Diagnosis Date Noted  . Chronic systolic CHF (congestive heart failure), NYHA class 2 ([place]) 12/27/2014  . Cerebrovascular accident (CVA) due to embolism of right middle cerebral artery ([place]) 11/24/2014  . 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 Chronic anticoagulation 11/24/2014  . Cardiomyopathy ([place]) 11/24/2014  . Chronic systolic heart failure ([place]) 10/10/2014  . Alcohol use disorder, mild, abuse 09/13/2014  . Cardiomyopathy, ischemic   . Atrial fibrillation ([place]) 09/07/2014  . Essential hypertension   . HLD (hyperlipidemia)   . Tobacco use disorder      Current Outpatient Medications on File Prior  to Visit  Medication Sig Dispense Refill  . carvedilol (COREG) 12.5 MG tablet Take 1.5 tablets (18.75 mg total) by mouth 2 (two) times daily with a meal. 270 tablet 3  . ELIQUIS 5 MG TABS tablet TAKE 1 TABLET BY MOUTH TWICE DAILY 180 tablet 1  . Multiple Vitamins-Minerals (MULTIVITAMIN & MINERAL PO) Take 1 tablet by mouth daily.    . nicotine polacrilex (COMMIT) 2 MG lozenge Take 1 lozenge (2 mg total) by mouth as needed for smoking cessation. 100 tablet 0   No current facility-administered medications on file prior to visit.    No Known Allergies  Social History   Socioeconomic History  . Marital status: Widowed    Spouse name: Not on file  . Number of children: Not on file  . Years of education: Not on file  . Highest education level: Not on file  Occupational History  . Not on file  Tobacco Use  . Smoking status: Current Some Day Smoker    Packs/day: 0.50    Types: Cigarettes    Start date: 09/05/2014    Last attempt to quit: 09/05/2014    Years since quitting: 5.2  . Smokeless tobacco: Former Network engineer and Sexual Activity  . Alcohol use: Yes    Alcohol/week: 0.0 standard drinks  Comment: rare  . Drug use: No  . Sexual activity: Not on file  Other Topics Concern  . Not on file  Social History Narrative   Patient lives with his wife   His son or daughter-in-law will accompany him to appointments    Social Determinants of Health   Financial Resource Strain:   . Difficulty of Paying Living Expenses: Not on file  Food Insecurity:   . Worried About Charity fundraiser in the Last Year: Not on file  . Ran Out of Food in the Last Year: Not on file  Transportation Needs:   . Lack of Transportation (Medical): Not on file  . Lack of Transportation (Non-Medical): Not on file  Physical Activity:   . Days of Exercise per Week: Not on file  . Minutes of Exercise per Session: Not on file  Stress:   . Feeling of Stress : Not on file  Social Connections:   .  Frequency of Communication with Friends and Family: Not on file  . Frequency of Social Gatherings with Friends and Family: Not on file  . Attends Religious Services: Not on file  . Active Member of Clubs or Organizations: Not on file  . Attends Archivist Meetings: Not on file  . Marital Status: Not on file  Intimate Partner Violence:   . Fear of Current or Ex-Partner: Not on file  . Emotionally Abused: Not on file  . Physically Abused: Not on file  . Sexually Abused: Not on file    Family History  Problem Relation Age of Onset  . Hypertension Mother   . Stroke Mother   . Stroke Maternal Grandfather   . Heart attack Neg Hx     Past Surgical History:  Procedure Laterality Date  . CARDIAC CATHETERIZATION N/A 10/10/2014   Procedure: Right/Left Heart Cath and Coronary Angiography;  Surgeon: Sherren Mocha, MD; normal coronary arteries, normal LVEDP, systemic hypertension   . REPAIR OF PERFORATED ULCER      ROS: Review of Systems Negative except as stated above  PHYSICAL EXAM: BP (!) 180/100   Pulse 75   Resp 16   Wt 153 lb (69.4 kg)   SpO2 98%   BMI 22.59 kg/m   Physical Exam  General appearance - alert, well appearing, older African-American male and in no distress Mental status - normal mood, behavior, speech, dress, motor activity, and thought processes Neck - supple, no significant adenopathy Chest - clear to auscultation, no wheezes, rales or rhonchi, symmetric air entry Heart -heart rate is irregularly irregular but rate controlled. Extremities - peripheral pulses normal, no pedal edema, no clubbing or cyanosis  CMP Latest Ref Rng & Units 03/22/2019 05/05/2018 01/29/2018  Glucose 65 - 99 mg/dL 74 - 69  BUN 8 - 27 mg/dL 13 - 16  Creatinine 0.76 - 1.27 mg/dL 1.45(H) - 1.12  Sodium 134 - 144 mmol/L 143 - 142  Potassium 3.5 - 5.2 mmol/L 4.8 - 4.4  Chloride 96 - 106 mmol/L 105 - 104  CO2 20 - 29 mmol/L 27 - 25  Calcium 8.6 - 10.2 mg/dL 9.5 - 8.8  Total  Protein 6.0 - 8.5 g/dL 7.2 6.6 -  Total Bilirubin 0.0 - 1.2 mg/dL 0.7 0.3 -  Alkaline Phos 39 - 117 IU/L 140(H) 188(H) -  AST 0 - 40 IU/L 21 18 -  ALT 0 - 44 IU/L 11 10 -   Lipid Panel     Component Value Date/Time   CHOL 107 03/22/2019  1620   TRIG 74 03/22/2019 1620   HDL 51 03/22/2019 1620   CHOLHDL 2.1 03/22/2019 1620   CHOLHDL 2.8 09/07/2014 0525   VLDL 10 09/07/2014 0525   LDLCALC 41 03/22/2019 1620    CBC    Component Value Date/Time   WBC 4.6 03/22/2019 1620   WBC 5.0 09/11/2015 1021   RBC 5.52 03/22/2019 1620   RBC 5.64 09/11/2015 1021   HGB 16.7 03/22/2019 1620   HCT 50.2 03/22/2019 1620   PLT 172 03/22/2019 1620   MCV 91 03/22/2019 1620   MCH 30.3 03/22/2019 1620   MCH 30.0 09/11/2015 1021   MCHC 33.3 03/22/2019 1620   MCHC 33.2 09/11/2015 1021   RDW 13.5 03/22/2019 1620   LYMPHSABS 1.2 06/12/2017 1059   MONOABS 0.7 09/06/2014 2254   EOSABS 0.0 06/12/2017 1059   BASOSABS 0.0 06/12/2017 1059    ASSESSMENT AND PLAN: 1. Essential hypertension Not at goal. Continue Coreg and Cozaar.  Add low dose Norvasc F/u with clinical pharmacist in 1 wk for repeat BP check - amLODipine (NORVASC) 5 MG tablet; Take 1 tablet (5 mg total) by mouth daily.  Dispense: 90 tablet; Refill: 3  2. Chronic systolic heart failure (HCC) Compensated at this time.  Continue carvedilol and Cozaar.  Continue furosemide. - Ambulatory referral to Cardiology - furosemide (LASIX) 40 MG tablet; 1/2 tab PO every day PO  Dispense: 15 tablet; Refill: 11  3. Paroxysmal atrial fibrillation (HCC) 4. Acquired thrombophilia ([place]) -Continue Eliquis.  Follow-up with cardiology. - Ambulatory referral to Cardiology  5. Hyperlipidemia, unspecified hyperlipidemia type - simvastatin (ZOCOR) 20 MG tablet; Take 1 tablet (20 mg total) by mouth daily.  Dispense: 30 tablet; Refill: 0  6. Screening for colon cancer Discussed the importance of colon cancer screening.  Discussed methods to screen.  He prefers  to be referred for colonoscopy. - Ambulatory referral to Gastroenterology  7. COVID-19 vaccination declined Discussed and encouraged him to get the COVID-19 vaccine.  Advised that the vaccines are relatively safe and effective.  Patient still declines.  8. Need for influenza vaccination Given today.  9.  Tobacco dependence Advised to quit.  Discussed health risks associated with smoking.  Patient states he is trying to quit and will continue to try to cut down on his own.  Less than 5 minutes spent on counseling.   Patient was given the opportunity to ask questions.  Patient verbalized understanding of the plan and was able to repeat key elements of the plan.   Orders Placed This Encounter  Procedures  . Ambulatory referral to Cardiology  . Ambulatory referral to Gastroenterology     Requested Prescriptions   Signed Prescriptions Disp Refills  . furosemide (LASIX) 40 MG tablet 15 tablet 11    Sig: 1/2 tab PO every day PO  . simvastatin (ZOCOR) 20 MG tablet 30 tablet 0    Sig: Take 1 tablet (20 mg total) by mouth daily.  Marland Kitchen losartan (COZAAR) 100 MG tablet 30 tablet 0    Sig: Take 1 tablet (100 mg total) by mouth daily.  Marland Kitchen amLODipine (NORVASC) 5 MG tablet 90 tablet 3    Sig: Take 1 tablet (5 mg total) by mouth daily.    Return in about 3 months (around 03/22/2020) for Give appt with [facility] in 1 wk for repeat BP check.  Karle Plumber, MD, FACP

## 2019-12-28 ENCOUNTER — Encounter: Payer: Self-pay | Admitting: Pharmacist

## 2019-12-28 ENCOUNTER — Other Ambulatory Visit: Payer: Self-pay

## 2019-12-28 ENCOUNTER — Ambulatory Visit: Payer: Medicare HMO | Attending: Internal Medicine | Admitting: Pharmacist

## 2019-12-28 VITALS — BP 129/85 | HR 61

## 2019-12-28 DIAGNOSIS — Z23 Encounter for immunization: Secondary | ICD-10-CM | POA: Diagnosis not present

## 2019-12-28 DIAGNOSIS — I10 Essential (primary) hypertension: Secondary | ICD-10-CM

## 2019-12-28 NOTE — Progress Notes (Addendum)
   S:    Patient arrives in good spirits.    Presents to the clinic for hypertension evaluation, counseling, and management.  Patient was referred and last seen by Primary Care Provider on 12/21/19. At this visit, amlodipine 5 mg was added d/t high BP.  Patient is adherent with medications.  Patient denies dizziness, headache, and chest pain.  Current BP Medications include:  Amlodipine 5 mg daily, carvedilol 12.5 mg (1.5 tablets daily), losartan 100 mg daily  Patient also takes furosemide 20 mg daily for heart failure  Antihypertensives tried in the past include: irbesartan, metoprolol  Dietary habits include: limits salt Exercise habits include: walks ~1 mile daily Family history: HTN & stroke (mother) Social history: attempting to quit smoking; smokes 1 pack over 2-3 weeks  O:  Vitals:   12/28/19 1418  BP: 129/85  Pulse: 61   Home BP readings: does not check BP at home  Last 3 Office BP readings: BP Readings from Last 3 Encounters:  12/28/19 129/85  12/21/19 (!) 180/100  10/04/19 123/86    BMET    Component Value Date/Time   NA 143 03/22/2019 1620   K 4.8 03/22/2019 1620   CL 105 03/22/2019 1620   CO2 27 03/22/2019 1620   GLUCOSE 74 03/22/2019 1620   GLUCOSE 106 (H) 09/11/2015 1021   BUN 13 03/22/2019 1620   CREATININE 1.45 (H) 03/22/2019 1620   CREATININE 1.31 (H) 09/11/2015 1021   CALCIUM 9.5 03/22/2019 1620   GFRNONAA 50 (L) 03/22/2019 1620   GFRAA 58 (L) 03/22/2019 1620    Renal function: CrCl cannot be calculated (Patient's most recent lab result is older than the maximum 21 days allowed.).  Clinical ASCVD: Yes  - history of stroke The ASCVD Risk score Denman George DC Jr., et al., 2013) failed to calculate for the following reasons:   The patient has a prior MI or stroke diagnosis   A/P: Longstanding hypertension currently close to goal on current medications. Patient has only been taking amlodipine for 1 week and may see additional improvement in BP over  the next few weeks. BP Goal = < 130/80 mmHg. Patient is adherent to medications.  -Continue current BP medications. -Flu shot given. -Counseled on lifestyle modifications for blood pressure control including reduced dietary sodium, increased exercise, adequate sleep.  Results reviewed and written information provided.   Total time in face-to-face counseling 15 minutes.   F/U Clinic Visit in February with Dr. Laural Benes.    Arletha Pili, PharmD Candidate [employer] [place] of 2022  Supervision:  Butch Penny, PharmD, CPP Clinical Pharmacist [facility] [phone]

## 2019-12-28 NOTE — Patient Instructions (Signed)

## 2019-12-31 ENCOUNTER — Telehealth: Payer: Self-pay | Admitting: *Deleted

## 2019-12-31 NOTE — Telephone Encounter (Signed)
Patient assistance papers faxed

## 2020-01-12 MED FILL — SIMVASTATIN 20 MG TABLET: 20 | 30 days supply | Qty: 30 | Fill #0

## 2020-01-12 MED FILL — LOSARTAN POTASSIUM 100 MG T: 100 | 30 days supply | Qty: 30 | Fill #0

## 2020-01-12 MED FILL — FUROSEMIDE 40 MG TAB: 40 | 30 days supply | Qty: 15 | Fill #0

## 2020-01-12 MED FILL — CARVEDILOL 12.5 MG TABLET: 12.5 | 30 days supply | Qty: 90 | Fill #1

## 2020-01-18 ENCOUNTER — Encounter: Payer: Self-pay | Admitting: Gastroenterology

## 2020-02-01 NOTE — Progress Notes (Signed)
Cardiology Office Note   Date:  02/02/2020   ID:  Jeffrey Martin, DOB 09/17/1950, MRN [id number]  PCP:  Marcine Matar, MD  Cardiologist:   No primary care provider on file.   Chief Complaint  Patient presents with  . Cardiomyopathy           History of Present Illness: Jeffrey Martin is a 69 y.o. male who presents for evaluation of cardiomyopathy and atrial fibrillation.   He's had a previous CVA. He's been treated with anticoagulation.  His EF is 30-35%. His course is complicated by alcohol, tobacco use, hypotension and  bradycardic. In the past his beta blocker was slightly reduced and his Lasix dose reduced.  He has had intermittent attendance at scheduled office appts.  He was followed [facility] but not in a couple of years.     Since he was last seen he is actually doing very well.  He does walking at least a mile and a half every day and he does not have any symptoms with this. The patient denies any new symptoms such as chest discomfort, neck or arm discomfort. There has been no new shortness of breath, PND or orthopnea. There have been no reported palpitations, presyncope or syncope.  He does not notice his fibrillation.  He tolerates his anticoagulation.   Past Medical History:  Diagnosis Date  . Atrial fibrillation (HCC) 07/2014  . History of cardiovascular stress test    Lexiscan Myoview 7/16:  EF 27%, possible inferoapical ischemia; High Risk; no CAD at cath  . Hyperlipidemia Dx July 2016  . Hypertension Dx June 2016  . NICM (nonischemic cardiomyopathy) (HCC) 11/2014   EF 30-35% by echo, 27% by MV, no CAD at cath  . Peptic ulcer 1988  . Stroke [facility])     Past Surgical History:  Procedure Laterality Date  . CARDIAC CATHETERIZATION N/A 10/10/2014   Procedure: Right/Left Heart Cath and Coronary Angiography;  Surgeon: Tonny Bollman, MD; normal coronary arteries, normal LVEDP, systemic hypertension   . REPAIR OF PERFORATED ULCER        Current Outpatient Medications  Medication Sig Dispense Refill  . amLODipine (NORVASC) 5 MG tablet Take 1 tablet (5 mg total) by mouth daily. 90 tablet 3  . carvedilol (COREG) 12.5 MG tablet Take 1.5 tablets (18.75 mg total) by mouth 2 (two) times daily with a meal. 270 tablet 3  . ELIQUIS 5 MG TABS tablet TAKE 1 TABLET BY MOUTH TWICE DAILY 180 tablet 1  . furosemide (LASIX) 40 MG tablet 1/2 tab PO every day PO 15 tablet 11  . losartan (COZAAR) 100 MG tablet Take 1 tablet (100 mg total) by mouth daily. 30 tablet 0  . Multiple Vitamins-Minerals (MULTIVITAMIN & MINERAL PO) Take 1 tablet by mouth daily.    . nicotine polacrilex (COMMIT) 2 MG lozenge Take 1 lozenge (2 mg total) by mouth as needed for smoking cessation. 100 tablet 0  . simvastatin (ZOCOR) 20 MG tablet Take 1 tablet (20 mg total) by mouth daily. 30 tablet 0   No current facility-administered medications for this visit.    Allergies:   Patient has no known allergies.    ROS:  Please see the history of present illness.   Otherwise, review of systems are positive for positive for none.   All other systems are reviewed and negative.    PHYSICAL EXAM: VS:  BP 130/75   Pulse 63   Ht 5\' 7"  (1.702 m)  Wt 151 lb 3.2 oz (68.6 kg)   SpO2 91%   BMI 23.68 kg/m  , BMI Body mass index is 23.68 kg/m. GENERAL:  Well appearing NECK:  No jugular venous distention, waveform within normal limits, carotid upstroke brisk and symmetric, no bruits, no thyromegaly LUNGS:  Clear to auscultation bilaterally CHEST:  Unremarkable HEART:  PMI not displaced or sustained,S1 and S2 within normal limits, no S3, no clicks, no rubs, no murmurs, irregular  ABD:  Flat, positive bowel sounds normal in frequency in pitch, no bruits, no rebound, no guarding, no midline pulsatile mass, no hepatomegaly, no splenomegaly EXT:  2 plus pulses throughout, no edema, no cyanosis no clubbing   EKG:  EKG is  ordered today. The ekg ordered today demonstrates  atrial fibrillation, rate 63, axis within normal limits, intervals within normal limits, no acute ST-T wave changes.     Recent Labs: 03/22/2019: ALT 11; BUN 13; Creatinine, Ser 1.45; Hemoglobin 16.7; Platelets 172; Potassium 4.8; Sodium 143    Lipid Panel    Component Value Date/Time   CHOL 107 03/22/2019 1620   TRIG 74 03/22/2019 1620   HDL 51 03/22/2019 1620   CHOLHDL 2.1 03/22/2019 1620   CHOLHDL 2.8 09/07/2014 0525   VLDL 10 09/07/2014 0525   LDLCALC 41 03/22/2019 1620      Wt Readings from Last 3 Encounters:  02/02/20 151 lb 3.2 oz (68.6 kg)  12/21/19 153 lb (69.4 kg)  10/04/19 148 lb 12.8 oz (67.5 kg)      Other studies Reviewed: Additional studies/ records that were reviewed today include: Labs. Review of the above records demonstrates:  Please see elsewhere in the note.     ASSESSMENT AND PLAN:  CARDIOMYOPATHY:   Seems to be euvolemic.  His last ejection fraction was 45%.  I would not suspect any change clinically.  He will continue the meds as listed.   HTN: His blood pressure is well controlled.  I was going to stop the Norvasc previously but for some reason still on his list and his blood pressure is excellent so no change in therapy.   CVA:   He had a CVA previously but no residual.  No change in therapy.   ATRIAL FIB:  [name] has a CHA2DS2 - VASc score of 5.  He tolerates anticoagulation.  He had a basic metabolic profile and CBC earlier this year.  He has good rate control.  No change in therapy.   ETOH:   He says he drinks alcohol very rarely.    TOBACCO:    He says he will smoke a few cigarettes now and then.  Current medicines are reviewed at length with the patient today.  The patient does not have concerns regarding medicines.  The following changes have been made:  None  Labs/ tests ordered today include:   Orders Placed This Encounter  Procedures  . EKG 12-Lead     Disposition:   FU with with me in one year.     Signed, Rollene Rotunda, MD  02/02/2020 4:17 PM    [facility]

## 2020-02-02 ENCOUNTER — Ambulatory Visit (INDEPENDENT_AMBULATORY_CARE_PROVIDER_SITE_OTHER): Payer: Medicare HMO | Admitting: Cardiology

## 2020-02-02 ENCOUNTER — Other Ambulatory Visit: Payer: Self-pay

## 2020-02-02 ENCOUNTER — Encounter: Payer: Self-pay | Admitting: Cardiology

## 2020-02-02 VITALS — BP 130/75 | HR 63 | Ht 67.0 in | Wt 151.2 lb

## 2020-02-02 DIAGNOSIS — Z720 Tobacco use: Secondary | ICD-10-CM

## 2020-02-02 DIAGNOSIS — I420 Dilated cardiomyopathy: Secondary | ICD-10-CM | POA: Diagnosis not present

## 2020-02-02 DIAGNOSIS — I10 Essential (primary) hypertension: Secondary | ICD-10-CM | POA: Diagnosis not present

## 2020-02-02 DIAGNOSIS — I4821 Permanent atrial fibrillation: Secondary | ICD-10-CM

## 2020-02-02 NOTE — Patient Instructions (Signed)

## 2020-03-03 ENCOUNTER — Other Ambulatory Visit: Payer: Self-pay | Admitting: Internal Medicine

## 2020-03-03 DIAGNOSIS — E785 Hyperlipidemia, unspecified: Secondary | ICD-10-CM

## 2020-03-03 MED FILL — LOSARTAN POTASSIUM 100 MG T: 100 | 30 days supply | Qty: 30 | Fill #0

## 2020-03-03 MED FILL — FUROSEMIDE 40 MG TAB: 40 | 30 days supply | Qty: 15 | Fill #1

## 2020-03-03 MED FILL — SIMVASTATIN 20 MG TABLET: 20 | 30 days supply | Qty: 30 | Fill #0

## 2020-03-03 NOTE — Telephone Encounter (Signed)
1) Medication(s) Requested (by name): losartan (COZAAR) 100 MG tablet  simvastatin (ZOCOR) 20 MG tablet   2) Pharmacy of Choice: [facility]   3) Special Requests: Pt. Is out of medication    Approved medications will be sent to the pharmacy, we will reach out if there is an issue.  Requests made after 3pm may not be addressed until the following business day!  If a patient is unsure of the name of the medication(s) please note and ask patient to call back when they are able to provide all info, do not send to responsible party until all information is available!

## 2020-03-20 ENCOUNTER — Ambulatory Visit: Payer: Medicare HMO | Admitting: Gastroenterology

## 2020-03-23 ENCOUNTER — Ambulatory Visit: Payer: Medicare Other | Attending: Internal Medicine | Admitting: Internal Medicine

## 2020-03-23 ENCOUNTER — Other Ambulatory Visit: Payer: Self-pay

## 2020-03-23 ENCOUNTER — Telehealth: Payer: Self-pay | Admitting: Internal Medicine

## 2020-03-23 ENCOUNTER — Encounter: Payer: Self-pay | Admitting: Internal Medicine

## 2020-03-23 DIAGNOSIS — I480 Paroxysmal atrial fibrillation: Secondary | ICD-10-CM | POA: Diagnosis not present

## 2020-03-23 DIAGNOSIS — F17210 Nicotine dependence, cigarettes, uncomplicated: Secondary | ICD-10-CM

## 2020-03-23 DIAGNOSIS — I10 Essential (primary) hypertension: Secondary | ICD-10-CM

## 2020-03-23 DIAGNOSIS — Z1211 Encounter for screening for malignant neoplasm of colon: Secondary | ICD-10-CM | POA: Diagnosis not present

## 2020-03-23 DIAGNOSIS — Z2821 Immunization not carried out because of patient refusal: Secondary | ICD-10-CM

## 2020-03-23 DIAGNOSIS — E785 Hyperlipidemia, unspecified: Secondary | ICD-10-CM

## 2020-03-23 NOTE — Progress Notes (Signed)
Virtual Visit via Telephone Note  I connected with Jeffrey Martin on 03/23/20 at 2:04 p.m by telephone and verified that I am speaking with the correct person using two identifiers.  Location: Patient: home Provider: office The patient, my CMA Jeffrey Martin and myself participated in this encounter.   I discussed the limitations, risks, security and privacy concerns of performing an evaluation and management service by telephone and the availability of in person appointments. I also discussed with the patient that there may be a patient responsible charge related to this service. The patient expressed understanding and agreed to proceed.   History of Present Illness: Patient with history of HTN, HL, A. 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  He is on carvedilol, Cozaar, furosemide and Eliquis.  Pt states he is running low on Eliquis.  His cardiologist ordered for him through a mail order company called [employer] but has not received it as yet. States he will get some samples from Dr. Jenene Slicker office tomorrow.  Med Adherence: [x] ? Yes    [] ? No Medication side effects: [] ? Yes    [x] ? No Adherence with salt restriction: [x] ? Yes    [] ? No Home Monitoring?: [] ? Yes    [x] ? No.  Last BP reading at the cardiologist office in 01/2020 was good Monitoring Frequency: [] ? Yes    [] ? No Home BP results range: [] ? Yes    [] ? No SOB? [x] ? Yes   When walking up hills Chest Pain?: [] ? Yes    [x] ? No Leg swelling?: [] ? Yes    [x] ? No Headaches?: [] ? Yes    [x] ? No Dizziness? [] ? Yes    [x] ? No Comments: No palpitations.   No bruising or bleeding.  Saw Dr. 01/2020  Tob dep: still trying to quit.  Reports 1 pk now last 1 mth.  HL: taking and tolerating Simvastatin  HM:  Never got COVID vaccine and does not want too.  Referred to GI on last visit.  He has  not received a call as yet.   Outpatient Encounter Medications as of 03/23/2020  Medication Sig  . amLODipine (NORVASC) 5 MG tablet Take 1 tablet (5 mg total) by mouth daily.  . carvedilol (COREG) 12.5 MG tablet Take 1.5 tablets (18.75 mg total) by mouth 2 (two) times daily with a meal.  . ELIQUIS 5 MG TABS tablet TAKE 1 TABLET BY MOUTH TWICE DAILY  . furosemide (LASIX) 40 MG tablet 1/2 tab PO every day PO  . losartan (COZAAR) 100 MG tablet TAKE 1 TABLET (100 MG TOTAL) BY MOUTH DAILY.  . Multiple Vitamins-Minerals (MULTIVITAMIN & MINERAL PO) Take 1 tablet by mouth daily.  . nicotine polacrilex (COMMIT) 2 MG lozenge Take 1 lozenge (2 mg total) by mouth as needed for smoking cessation.  . simvastatin (ZOCOR) 20 MG tablet TAKE 1 TABLET (20 MG TOTAL) BY MOUTH DAILY.   No facility-administered encounter medications on file as of 03/23/2020.      Observations/Objective: No direct observation done as this was a telephone encounter.  Assessment and Plan: 1. Essential hypertension At goal based on last blood pressure reading in cardiologist office.  He will continue amlodipine, carvedilol and Cozaar.  2. Hyperlipidemia, unspecified hyperlipidemia type Continue simvastatin.  3. Paroxysmal atrial fibrillation [facility]) Patient followed by cardiology.  He is on Eliquis.  4. Screening for colon cancer Message sent to referral coordinator letting her  know that patient has not received appointment as yet with the gastroenterologist for colonoscopy  5. Light smoker Commended him for continuing to cut back.  Have encouraged him to set a quit date.  6. COVID-19 vaccination declined   Follow Up Instructions: 4 mths   I discussed the assessment and treatment plan with the patient. The patient was provided an opportunity to ask questions and all were answered. The patient agreed with the plan and demonstrated an understanding of the instructions.   The patient was advised to call back or seek an  in-person evaluation if the symptoms worsen or if the condition fails to improve as anticipated.  I provided 9 minutes of non-face-to-face time during this encounter.   Jonah Blue, MD

## 2020-03-23 NOTE — Telephone Encounter (Signed)
-----   Message from Dionne Bucy sent at 03/23/2020  4:04 PM EST ----- Regarding: GI  Referral Patient had an appointment   Mon 03/20/20 11:00 AM [facility]STR[facility] OFFICE ARMBRUSTER, STEVEN P NEW PATIENT 20 Scheduled  ----- Message ----- From: Marcine Matar, MD Sent: 03/23/2020   2:16 PM EST To: Dionne Bucy  Await appt with GI.

## 2020-04-04 ENCOUNTER — Telehealth: Payer: Self-pay

## 2020-04-04 NOTE — Telephone Encounter (Signed)
Attempted to call patient X3. Call continues to say cannot be completed at this time.   Received paperwork from [facility] stating patient was denied for patient assistance with Eliquis. Trying to reach patient to find out if he is still taking the Eliquis as prescribed and see if he is able to afford the medication. If not will need to discuss with Dr. Antoine Poche and pharm D alternatives.

## 2020-04-27 ENCOUNTER — Other Ambulatory Visit: Payer: Self-pay | Admitting: Internal Medicine

## 2020-04-27 ENCOUNTER — Other Ambulatory Visit: Payer: Self-pay | Admitting: Pharmacist

## 2020-04-27 ENCOUNTER — Other Ambulatory Visit: Payer: Self-pay | Admitting: Family

## 2020-04-27 DIAGNOSIS — I480 Paroxysmal atrial fibrillation: Secondary | ICD-10-CM

## 2020-04-27 DIAGNOSIS — E785 Hyperlipidemia, unspecified: Secondary | ICD-10-CM

## 2020-04-27 MED ORDER — CARVEDILOL 12.5 MG PO TABS: ORAL_TABLET | ORAL | 1 refills | Status: DC

## 2020-04-27 MED FILL — FUROSEMIDE 40 MG TAB: 40 | 30 days supply | Qty: 15 | Fill #2

## 2020-04-27 MED FILL — LOSARTAN POTASSIUM 100 MG T: 100 | 30 days supply | Qty: 30 | Fill #0

## 2020-04-27 MED FILL — AMLODIPINE BESYLATE 5 MG TA: 5 | 90 days supply | Qty: 90 | Fill #1

## 2020-04-27 MED FILL — SIMVASTATIN 20 MG TABLET: 20 | 30 days supply | Qty: 30 | Fill #0

## 2020-04-27 MED FILL — CARVEDILOL 12.5 MG TABLET: 12.5 | 30 days supply | Qty: 90 | Fill #0

## 2020-06-16 ENCOUNTER — Other Ambulatory Visit: Payer: Self-pay

## 2020-06-16 ENCOUNTER — Other Ambulatory Visit: Payer: Self-pay | Admitting: Pharmacist

## 2020-06-16 ENCOUNTER — Other Ambulatory Visit: Payer: Self-pay | Admitting: Internal Medicine

## 2020-06-16 DIAGNOSIS — E785 Hyperlipidemia, unspecified: Secondary | ICD-10-CM

## 2020-06-16 MED ORDER — SIMVASTATIN 20 MG PO TABS
ORAL_TABLET | Freq: Every day | ORAL | 2 refills | Status: DC
  Filled 2020-06-16: qty 30, 30d supply, fill #0
  Filled 2020-08-18: qty 60, 60d supply, fill #1

## 2020-06-16 MED ORDER — LOSARTAN POTASSIUM 100 MG PO TABS
ORAL_TABLET | Freq: Every day | ORAL | 2 refills | Status: DC
  Filled 2020-06-16: qty 30, 30d supply, fill #0
  Filled 2020-08-18: qty 60, 60d supply, fill #1

## 2020-06-16 MED ORDER — SIMVASTATIN 20 MG PO TABS
ORAL_TABLET | Freq: Every day | ORAL | 0 refills | Status: DC
  Filled 2020-06-16: qty 60, fill #0

## 2020-06-16 MED ORDER — LOSARTAN POTASSIUM 100 MG PO TABS
ORAL_TABLET | Freq: Every day | ORAL | 0 refills | Status: DC
Start: 2020-06-16 — End: 2020-06-16
  Filled 2020-06-16: qty 60, fill #0

## 2020-06-16 MED FILL — Furosemide Tab 40 MG: ORAL | 30 days supply | Qty: 15 | Fill #0 | Status: AC

## 2020-06-16 NOTE — Telephone Encounter (Signed)
Requested Prescriptions  Pending Prescriptions Disp Refills  . losartan (COZAAR) 100 MG tablet 30 tablet 0    Sig: TAKE 1 TABLET (100 MG TOTAL) BY MOUTH DAILY.     Cardiovascular:  Angiotensin Receptor Blockers Failed - 06/16/2020  8:37 AM      Failed - Cr in normal range and within 180 days    Creat  Date Value Ref Range Status  09/11/2015 1.31 (H) 0.70 - 1.25 mg/dL Final    Comment:      For patients > or = 68 years of age: The upper reference limit for Creatinine is approximately 13% higher for people identified as African-American.      Creatinine, Ser  Date Value Ref Range Status  03/22/2019 1.45 (H) 0.76 - 1.27 mg/dL Final         Failed - K in normal range and within 180 days    Potassium  Date Value Ref Range Status  03/22/2019 4.8 3.5 - 5.2 mmol/L Final         Passed - Patient is not pregnant      Passed - Last BP in normal range    BP Readings from Last 1 Encounters:  02/02/20 130/75         Passed - Valid encounter within last 6 months    Recent Outpatient Visits          2 months ago Essential hypertension   [facility] Marcine Matar, MD   5 months ago Need for influenza vaccination   [facility] Lois Huxley, Cornelius Moras, RPH-CPP   5 months ago Essential hypertension   [facility] Marcine Matar, MD   11 months ago Essential hypertension   [facility] Marcine Matar, MD   1 year ago Need for vaccination for Strep pneumoniae   [facility] Lois Huxley, Cornelius Moras, RPH-CPP      Future Appointments            In 1 month Laural Benes, Binnie Rail, MD [facility]           . simvastatin (ZOCOR) 20 MG tablet 30 tablet 0    Sig: TAKE 1 TABLET (20 MG TOTAL) BY MOUTH DAILY.     Cardiovascular:  Antilipid - Statins Failed - 06/16/2020  8:37 AM      Failed - Total  Cholesterol in normal range and within 360 days    Cholesterol, Total  Date Value Ref Range Status  03/22/2019 107 100 - 199 mg/dL Final         Failed - LDL in normal range and within 360 days    LDL Chol Calc (NIH)  Date Value Ref Range Status  03/22/2019 41 0 - 99 mg/dL Final         Failed - HDL in normal range and within 360 days    HDL  Date Value Ref Range Status  03/22/2019 51 >39 mg/dL Final         Failed - Triglycerides in normal range and within 360 days    Triglycerides  Date Value Ref Range Status  03/22/2019 74 0 - 149 mg/dL Final         Passed - Patient is not pregnant      Passed - Valid encounter within last 12 months    Recent Outpatient Visits  2 months ago Essential hypertension   [facility] Marcine Matar, MD   5 months ago Need for influenza vaccination   [facility] Lois Huxley, Cornelius Moras, RPH-CPP   5 months ago Essential hypertension   [facility] Marcine Matar, MD   11 months ago Essential hypertension   [facility] Marcine Matar, MD   1 year ago Need for vaccination for Strep pneumoniae   [facility] Lois Huxley, Cornelius Moras, RPH-CPP      Future Appointments            In 1 month Laural Benes, Binnie Rail, MD [facility]

## 2020-07-21 ENCOUNTER — Ambulatory Visit: Payer: Medicare Other | Admitting: Internal Medicine

## 2020-08-18 ENCOUNTER — Other Ambulatory Visit: Payer: Self-pay

## 2020-08-18 MED FILL — Furosemide Tab 40 MG: ORAL | 60 days supply | Qty: 30 | Fill #1 | Status: AC

## 2020-08-18 MED FILL — Carvedilol Tab 12.5 MG: ORAL | 60 days supply | Qty: 180 | Fill #0 | Status: AC

## 2020-09-19 ENCOUNTER — Ambulatory Visit: Payer: Medicare PPO | Admitting: Internal Medicine

## 2020-09-19 ENCOUNTER — Ambulatory Visit: Payer: Medicare PPO | Admitting: Nurse Practitioner

## 2020-09-19 ENCOUNTER — Ambulatory Visit: Payer: Medicare PPO

## 2020-11-29 ENCOUNTER — Telehealth: Payer: Self-pay | Admitting: *Deleted

## 2020-11-29 DIAGNOSIS — Z1211 Encounter for screening for malignant neoplasm of colon: Secondary | ICD-10-CM

## 2020-11-29 NOTE — Telephone Encounter (Signed)
Copied from CRM [id number]. Topic: Referral - Request for Referral >> Nov 21, 2020  1:13 PM Randol Kern wrote: Has patient seen PCP for this complaint? Yes.   *If NO, is insurance requiring patient see PCP for this issue before PCP can refer them? Referral for which specialty: GI  Preferred provider/office: Highest recommended  Reason for referral: Colonoscopy

## 2020-12-01 NOTE — Telephone Encounter (Signed)
Will forward to provider to place referral  

## 2020-12-01 NOTE — Addendum Note (Signed)
Addended by: Jonah Blue B on: 12/01/2020 12:48 PM   Modules accepted: Orders

## 2021-03-08 NOTE — Progress Notes (Deleted)
Cardiology Office Note   Date:  03/08/2021   ID:  Jeffrey Martin, DOB [birth year], MRN [id number]  PCP:  Jeffrey Matar, MD  Cardiologist:   Jeffrey Rotunda, MD   No chief complaint on file.     History of Present Illness: Jeffrey Martin is a 71 y.o. male who presents for evaluation of cardiomyopathy and atrial fibrillation.   He's had a previous CVA. He's been treated with anticoagulation.  His EF is 30-35%. His course is complicated by alcohol, tobacco use, hypotension and  bradycardic. In the past his beta blocker was slightly reduced and his Lasix dose reduced.  He has had intermittent attendance at scheduled office appts.  [facility] but not in a couple of years.     Since he was last seen ***   *** he is actually doing very well.  He does walking at least a mile and a half every day and he does not have any symptoms with this. The patient denies any new symptoms such as chest discomfort, neck or arm discomfort. There has been no new shortness of breath, PND or orthopnea. There have been no reported palpitations, presyncope or syncope.  He does not notice his fibrillation.  He tolerates his anticoagulation.   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 [facility])    Cerebrovascular accident (CVA) due to embolism of right middle cerebral artery (HCC)    CHF (congestive heart failure) (HCC)    Chronic anticoagulation    History of cardiovascular stress test    Lexiscan Myoview 7/16:  EF 27%, possible inferoapical ischemia; High Risk; no CAD at cath   Hyperlipidemia Dx July 2016   Hypertension Dx June 2016   NICM (nonischemic cardiomyopathy) (HCC) 11/2014   EF 30-35% by echo, 27% by MV, no CAD at cath   Peptic ulcer 1988   Stroke [facility])     Past Surgical History:  Procedure Laterality Date   CARDIAC CATHETERIZATION N/A 10/10/2014   Procedure:  Right/Left Heart Cath and Coronary Angiography;  Surgeon: Tonny Bollman, MD; normal coronary arteries, normal LVEDP, systemic hypertension    REPAIR OF PERFORATED ULCER       Current Outpatient Medications  Medication Sig Dispense Refill   amLODipine (NORVASC) 5 MG tablet TAKE 1 TABLET (5 MG TOTAL) BY MOUTH DAILY. 90 tablet 3   carvedilol (COREG) 12.5 MG tablet TAKE 1 AND 1/2 TABLETS BY MOUTH 2 (TWO) TIMES DAILY WITH A MEAL. 90 tablet 1   ELIQUIS 5 MG TABS tablet TAKE 1 TABLET BY MOUTH TWICE DAILY 180 tablet 1   furosemide (LASIX) 40 MG tablet TAKE 1/2 TABLET BY MOUTH DAILY. 15 tablet 11   losartan (COZAAR) 100 MG tablet TAKE 1 TABLET (100 MG TOTAL) BY MOUTH DAILY. 30 tablet 2   Multiple Vitamins-Minerals (MULTIVITAMIN & MINERAL PO) Take 1 tablet by mouth daily.     nicotine polacrilex (COMMIT) 2 MG lozenge Take 1 lozenge (2 mg total) by mouth as needed for smoking cessation. 100 tablet 0   simvastatin (ZOCOR) 20 MG tablet TAKE 1 TABLET (20 MG TOTAL) BY MOUTH DAILY. 30 tablet 2   No current facility-administered medications for this visit.    Allergies:   Patient has no known allergies.    ROS:  Please see the history of present illness.   Otherwise, review of systems are positive for positive for ***.  All other systems are reviewed and negative.    PHYSICAL EXAM: VS:  There were no vitals taken for this visit. , BMI There is no height or weight on file to calculate BMI. GENERAL:  Well appearing NECK:  No jugular venous distention, waveform within normal limits, carotid upstroke brisk and symmetric, no bruits, no thyromegaly LUNGS:  Clear to auscultation bilaterally CHEST:  Unremarkable HEART:  PMI not displaced or sustained,S1 and S2 within normal limits, no S3, no clicks, no rubs, *** murmurs, irregular ***   ABD:  Flat, positive bowel sounds normal in frequency in pitch, no bruits, no rebound, no guarding, no midline pulsatile mass, no hepatomegaly, no splenomegaly EXT:  2 plus  pulses throughout, no edema, no cyanosis no clubbing    ***GENERAL:  Well appearing NECK:  No jugular venous distention, waveform within normal limits, carotid upstroke brisk and symmetric, no bruits, no thyromegaly LUNGS:  Clear to auscultation bilaterally CHEST:  Unremarkable HEART:  PMI not displaced or sustained,S1 and S2 within normal limits, no S3, no clicks, no rubs, no murmurs, irregular  ABD:  Flat, positive bowel sounds normal in frequency in pitch, no bruits, no rebound, no guarding, no midline pulsatile mass, no hepatomegaly, no splenomegaly EXT:  2 plus pulses throughout, no edema, no cyanosis no clubbing   EKG:  EKG is *** ordered today. The ekg ordered today demonstrates atrial fibrillation, rate ***, axis within normal limits, intervals within normal limits, no acute ST-T wave changes.     Recent Labs: No results found for requested labs within last 8760 hours.    Lipid Panel    Component Value Date/Time   CHOL 107 03/22/2019 1620   TRIG 74 03/22/2019 1620   HDL 51 03/22/2019 1620   CHOLHDL 2.1 03/22/2019 1620   CHOLHDL 2.8 09/07/2014 0525   VLDL 10 09/07/2014 0525   LDLCALC 41 03/22/2019 1620      Wt Readings from Last 3 Encounters:  02/02/20 151 lb 3.2 oz (68.6 kg)  12/21/19 153 lb (69.4 kg)  10/04/19 148 lb 12.8 oz (67.5 kg)      Other studies Reviewed: Additional studies/ records that were reviewed today include: ***. Review of the above records demonstrates:  Please see elsewhere in the note.     ASSESSMENT AND PLAN:  CARDIOMYOPATHY:   His last ejection fraction was 45%.  ***  I would not suspect any change clinically.  He will continue the meds as listed.   HTN: His blood pressure is *** well controlled.  I was going to stop the Norvasc previously but for some reason still on his list and his blood pressure is excellent so no change in therapy.    CVA:   He had a CVA previously but no residual.   ***No change in therapy.   ATRIAL FIB:  Mr.  Jeffrey Martin has a CHA2DS2 - VASc score of 5. ***  He tolerates anticoagulation.  He had a basic metabolic profile and CBC earlier this year.  He has good rate control.  No change in therapy.   ETOH:   *** He says he drinks alcohol very rarely.    TOBACCO:     *** He says he will smoke a few cigarettes now and then.  Current medicines are reviewed at length with the patient today.  The patient does not have concerns regarding medicines.  The following changes have been made:  ***  Labs/ tests ordered today include: ***  No orders of the defined types  were placed in this encounter.    Disposition:   FU with with me in *** year.    Signed, Minus Breeding, MD  03/08/2021 9:07 PM    [facility]

## 2021-03-09 ENCOUNTER — Ambulatory Visit: Payer: Medicare PPO | Admitting: Cardiology

## 2021-03-09 DIAGNOSIS — I5022 Chronic systolic (congestive) heart failure: Secondary | ICD-10-CM

## 2021-03-09 DIAGNOSIS — I10 Essential (primary) hypertension: Secondary | ICD-10-CM

## 2021-03-09 DIAGNOSIS — I482 Chronic atrial fibrillation, unspecified: Secondary | ICD-10-CM

## 2021-03-14 ENCOUNTER — Encounter: Payer: Self-pay | Admitting: Internal Medicine

## 2021-03-15 ENCOUNTER — Ambulatory Visit: Payer: Medicare PPO | Admitting: Cardiology

## 2021-04-12 NOTE — Progress Notes (Unsigned)
Cardiology Office Note   Date:  04/12/2021   ID:  Jeffrey Martin, DOB January 13, 1951, MRN [id number]  PCP:  Marcine Matar, MD  Cardiologist:   Rollene Rotunda, MD   No chief complaint on file.     History of Present Illness: Jeffrey Martin is a 71 y.o. male who presents for evaluation of cardiomyopathy and atrial fibrillation.   He's had a previous CVA. He's been treated with anticoagulation.  His EF is 30-35%. His course is complicated by alcohol, tobacco use, hypotension and  bradycardic. In the past his beta blocker was slightly reduced and his Lasix dose reduced.  He has had intermittent attendance at scheduled office appts.  He was [facility] in the [facility] but not in a couple of years.     Since he was last seen ***   *** he is actually doing very well.  He does walking at least a mile and a half every day and he does not have any symptoms with this. The patient denies any new symptoms such as chest discomfort, neck or arm discomfort. There has been no new shortness of breath, PND or orthopnea. There have been no reported palpitations, presyncope or syncope.  He does not notice his fibrillation.  He tolerates his anticoagulation.   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 [facility])    Cerebrovascular accident (CVA) due to embolism of right middle cerebral artery (HCC)    CHF (congestive heart failure) (HCC)    Chronic anticoagulation    History of cardiovascular stress test    Lexiscan Myoview 7/16:  EF 27%, possible inferoapical ischemia; High Risk; no CAD at cath   Hyperlipidemia Dx July 2016   Hypertension Dx June 2016   NICM (nonischemic cardiomyopathy) (HCC) 11/2014   EF 30-35% by echo, 27% by MV, no CAD at cath   Peptic ulcer 1988   Stroke [facility])     Past Surgical History:  Procedure Laterality Date   CARDIAC CATHETERIZATION N/A 10/10/2014   Procedure:  Right/Left Heart Cath and Coronary Angiography;  Surgeon: Tonny Bollman, MD; normal coronary arteries, normal LVEDP, systemic hypertension    REPAIR OF PERFORATED ULCER       Current Outpatient Medications  Medication Sig Dispense Refill   amLODipine (NORVASC) 5 MG tablet TAKE 1 TABLET (5 MG TOTAL) BY MOUTH DAILY. 90 tablet 3   carvedilol (COREG) 12.5 MG tablet TAKE 1 AND 1/2 TABLETS BY MOUTH 2 (TWO) TIMES DAILY WITH A MEAL. 90 tablet 1   ELIQUIS 5 MG TABS tablet TAKE 1 TABLET BY MOUTH TWICE DAILY 180 tablet 1   furosemide (LASIX) 40 MG tablet TAKE 1/2 TABLET BY MOUTH DAILY. 15 tablet 11   losartan (COZAAR) 100 MG tablet TAKE 1 TABLET (100 MG TOTAL) BY MOUTH DAILY. 30 tablet 2   Multiple Vitamins-Minerals (MULTIVITAMIN & MINERAL PO) Take 1 tablet by mouth daily.     nicotine polacrilex (COMMIT) 2 MG lozenge Take 1 lozenge (2 mg total) by mouth as needed for smoking cessation. 100 tablet 0   simvastatin (ZOCOR) 20 MG tablet TAKE 1 TABLET (20 MG TOTAL) BY MOUTH DAILY. 30 tablet 2   No current facility-administered medications for this visit.    Allergies:   Patient has no known allergies.    ROS:  Please see the history of present illness.   Otherwise, review of systems are positive for positive for ***.  All other systems are reviewed and negative.    PHYSICAL EXAM: VS:  There were no vitals taken for this visit. , BMI There is no height or weight on file to calculate BMI. GENERAL:  Well appearing NECK:  No jugular venous distention, waveform within normal limits, carotid upstroke brisk and symmetric, no bruits, no thyromegaly LUNGS:  Clear to auscultation bilaterally CHEST:  Unremarkable HEART:  PMI not displaced or sustained,S1 and S2 within normal limits, no S3, no clicks, no rubs, *** murmurs, irregular  ABD:  Flat, positive bowel sounds normal in frequency in pitch, no bruits, no rebound, no guarding, no midline pulsatile mass, no hepatomegaly, no splenomegaly EXT:  2 plus  pulses throughout, no edema, no cyanosis no clubbing    ***GENERAL:  Well appearing NECK:  No jugular venous distention, waveform within normal limits, carotid upstroke brisk and symmetric, no bruits, no thyromegaly LUNGS:  Clear to auscultation bilaterally CHEST:  Unremarkable HEART:  PMI not displaced or sustained,S1 and S2 within normal limits, no S3, no clicks, no rubs, no murmurs, irregular  ABD:  Flat, positive bowel sounds normal in frequency in pitch, no bruits, no rebound, no guarding, no midline pulsatile mass, no hepatomegaly, no splenomegaly EXT:  2 plus pulses throughout, no edema, no cyanosis no clubbing   EKG:  EKG is *** ordered today. The ekg ordered today demonstrates atrial fibrillation, rate ***, axis within normal limits, intervals within normal limits, no acute ST-T wave changes.     Recent Labs: No results found for requested labs within last 8760 hours.    Lipid Panel    Component Value Date/Time   CHOL 107 03/22/2019 1620   TRIG 74 03/22/2019 1620   HDL 51 03/22/2019 1620   CHOLHDL 2.1 03/22/2019 1620   CHOLHDL 2.8 09/07/2014 0525   VLDL 10 09/07/2014 0525   LDLCALC 41 03/22/2019 1620      Wt Readings from Last 3 Encounters:  02/02/20 151 lb 3.2 oz (68.6 kg)  12/21/19 153 lb (69.4 kg)  10/04/19 148 lb 12.8 oz (67.5 kg)      Other studies Reviewed: Additional studies/ records that were reviewed today include: ***. Review of the above records demonstrates:  Please see elsewhere in the note.     ASSESSMENT AND PLAN:  CARDIOMYOPATHY:   Seems to be euvolemic.  His last ejection fraction was 45%.  ***  I would not suspect any change clinically.  He will continue the meds as listed.   HTN: His blood pressure is *** well controlled.  I was going to stop the Norvasc previously but for some reason still on his list and his blood pressure is excellent so no change in therapy.    CVA:   He had a CVA previously but no residual.  ***  No change in  therapy.   ATRIAL FIB:  [name] has a CHA2DS2 - VASc score of 5.  ***  He tolerates anticoagulation.  He had a basic metabolic profile and CBC earlier this year.  He has good rate control.  No change in therapy.   ETOH:   *** He says he drinks alcohol very rarely.    TOBACCO:  ***     He says he will smoke a few cigarettes now and then.  Current medicines are reviewed at length with the patient today.  The patient does not have concerns regarding medicines.  The following changes have been made:  ***  Labs/ tests ordered today include: ***  No orders of the defined types were placed in this encounter.    Disposition:   FU with with me in ***   Signed, Rollene Rotunda, MD  04/12/2021 8:27 PM     [facility]

## 2021-04-13 ENCOUNTER — Ambulatory Visit: Payer: Medicare PPO | Admitting: Cardiology

## 2021-04-13 DIAGNOSIS — I420 Dilated cardiomyopathy: Secondary | ICD-10-CM

## 2021-04-13 DIAGNOSIS — I482 Chronic atrial fibrillation, unspecified: Secondary | ICD-10-CM

## 2021-04-13 DIAGNOSIS — I10 Essential (primary) hypertension: Secondary | ICD-10-CM

## 2021-04-22 NOTE — Progress Notes (Deleted)
?  ?Cardiology Office Note ? ? ?Date:  04/22/2021  ? ?ID:  Jeffrey Martin, DOB [birth year], MRN [id number] ? ?PCP:  Marcine Matar, MD  ?Cardiologist:   Rollene Rotunda, MD ? ? ?No chief complaint on file. ? ? ?  ?History of Present Illness: ?Jeffrey Martin is a 71 y.o. male who presents for evaluation of cardiomyopathy and atrial fibrillation.   He's had a previous CVA. He's been treated with anticoagulation.  His EF is 30-35%. His course is complicated by alcohol, tobacco use, hypotension and  bradycardic. In the past his beta blocker was slightly reduced and his Lasix dose reduced.  He has had intermittent attendance at scheduled office appts.  He was [facility] in the [facility] but not in a couple of years.    ? ?Since he was last seen ***  ? ?*** he is actually doing very well.  He does walking at least a mile and a half every day and he does not have any symptoms with this. The patient denies any new symptoms such as chest discomfort, neck or arm discomfort. There has been no new shortness of breath, PND or orthopnea. There have been no reported palpitations, presyncope or syncope.  He does not notice his fibrillation.  He tolerates his anticoagulation. ? ? ?Past Medical History:  ?Diagnosis Date  ? Atrial fibrillation (HCC) 07/2014  ? 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 Cerebrovascular accident (CVA) due to embolism of right middle cerebral artery (HCC)   ? CHF (congestive heart failure) (HCC)   ? Chronic anticoagulation   ? History of cardiovascular stress test   ? Lexiscan Myoview 7/16:  EF 27%, possible inferoapical ischemia; High Risk; no CAD at cath  ? Hyperlipidemia Dx July 2016  ? Hypertension Dx June 2016  ? NICM (nonischemic cardiomyopathy) (HCC) 11/2014  ? EF 30-35% by echo, 27% by MV, no CAD at cath  ? Peptic ulcer 1988  ? Stroke [facility])   ? ? ?Past Surgical History:  ?Procedure Laterality Date  ? CARDIAC CATHETERIZATION N/A 10/10/2014  ? Procedure:  Right/Left Heart Cath and Coronary Angiography;  Surgeon: Tonny Bollman, MD; normal coronary arteries, normal LVEDP, systemic hypertension   ? REPAIR OF PERFORATED ULCER    ? ? ? ?Current Outpatient Medications  ?Medication Sig Dispense Refill  ? amLODipine (NORVASC) 5 MG tablet TAKE 1 TABLET (5 MG TOTAL) BY MOUTH DAILY. 90 tablet 3  ? carvedilol (COREG) 12.5 MG tablet TAKE 1 AND 1/2 TABLETS BY MOUTH 2 (TWO) TIMES DAILY WITH A MEAL. 90 tablet 1  ? ELIQUIS 5 MG TABS tablet TAKE 1 TABLET BY MOUTH TWICE DAILY 180 tablet 1  ? furosemide (LASIX) 40 MG tablet TAKE 1/2 TABLET BY MOUTH DAILY. 15 tablet 11  ? losartan (COZAAR) 100 MG tablet TAKE 1 TABLET (100 MG TOTAL) BY MOUTH DAILY. 30 tablet 2  ? Multiple Vitamins-Minerals (MULTIVITAMIN & MINERAL PO) Take 1 tablet by mouth daily.    ? nicotine polacrilex (COMMIT) 2 MG lozenge Take 1 lozenge (2 mg total) by mouth as needed for smoking cessation. 100 tablet 0  ? simvastatin (ZOCOR) 20 MG tablet TAKE 1 TABLET (20 MG TOTAL) BY MOUTH DAILY. 30 tablet 2  ? ?No current facility-administered medications for this visit.  ? ? ?Allergies:   Patient has no known allergies.  ? ? ?ROS:  Please see the history of present illness.   Otherwise, review of systems are positive for positive for ***.  All other systems are reviewed and negative.  ? ? ?PHYSICAL EXAM: ?VS:  There were no vitals taken for this visit. , BMI There is no height or weight on file to calculate BMI. ?GENERAL:  Well appearing ?NECK:  No jugular venous distention, waveform within normal limits, carotid upstroke brisk and symmetric, no bruits, no thyromegaly ?LUNGS:  Clear to auscultation bilaterally ?CHEST:  Unremarkable ?HEART:  PMI not displaced or sustained,S1 and S2 within normal limits, no S3, no clicks, no rubs, *** murmurs, irregular  ?ABD:  Flat, positive bowel sounds normal in frequency in pitch, no bruits, no rebound, no guarding, no midline pulsatile mass, no hepatomegaly, no splenomegaly ?EXT:  2 plus  pulses throughout, no edema, no cyanosis no clubbing ? ? ? ?***GENERAL:  Well appearing ?NECK:  No jugular venous distention, waveform within normal limits, carotid upstroke brisk and symmetric, no bruits, no thyromegaly ?LUNGS:  Clear to auscultation bilaterally ?CHEST:  Unremarkable ?HEART:  PMI not displaced or sustained,S1 and S2 within normal limits, no S3, no clicks, no rubs, no murmurs, irregular  ?ABD:  Flat, positive bowel sounds normal in frequency in pitch, no bruits, no rebound, no guarding, no midline pulsatile mass, no hepatomegaly, no splenomegaly ?EXT:  2 plus pulses throughout, no edema, no cyanosis no clubbing ? ? ?EKG:  EKG is *** ordered today. ?The ekg ordered today demonstrates atrial fibrillation, rate ***, axis within normal limits, intervals within normal limits, no acute ST-T wave changes.   ? ? ?Recent Labs: ?No results found for requested labs within last 8760 hours.  ? ? ?Lipid Panel ?   ?Component Value Date/Time  ? CHOL 107 03/22/2019 1620  ? TRIG 74 03/22/2019 1620  ? HDL 51 03/22/2019 1620  ? CHOLHDL 2.1 03/22/2019 1620  ? CHOLHDL 2.8 09/07/2014 0525  ? VLDL 10 09/07/2014 0525  ? LDLCALC 41 03/22/2019 1620  ? ?  ? ?Wt Readings from Last 3 Encounters:  ?02/02/20 151 lb 3.2 oz (68.6 kg)  ?12/21/19 153 lb (69.4 kg)  ?10/04/19 148 lb 12.8 oz (67.5 kg)  ?  ? ? ?Other studies Reviewed: ?Additional studies/ records that were reviewed today include: ***. ?Review of the above records demonstrates:  Please see elsewhere in the note.   ? ? ?ASSESSMENT AND PLAN: ? ?CARDIOMYOPATHY:   Seems to be euvolemic.  His last ejection fraction was 45%.  ***  I would not suspect any change clinically.  He will continue the meds as listed.  ? ?HTN: His blood pressure is *** well controlled.  I was going to stop the Norvasc previously but for some reason still on his list and his blood pressure is excellent so no change in therapy.  ?  ?CVA:   He had a CVA previously but no residual.  ***  No change in  therapy.  ? ?ATRIAL FIB:  [name] has a CHA2DS2 - VASc score of 5.  ***  He tolerates anticoagulation.  He had a basic metabolic profile and CBC earlier this year.  He has good rate control.  No change in therapy.  ? ?ETOH:   *** He says he drinks alcohol very rarely.   ? ?TOBACCO:  ***     He says he will smoke a few cigarettes now and then. ? ?Current medicines are reviewed at length with the patient today.  The patient does not have concerns regarding medicines. ? ?The following changes have been made:  *** ? ?Labs/ tests ordered today include: *** ? ?  No orders of the defined types were placed in this encounter. ? ? ? ?Disposition:   FU with with me in *** ? ? ?Signed, ?Rollene Rotunda, MD  ?04/22/2021 8:45 PM    ?[facility] ? ?

## 2021-04-23 ENCOUNTER — Ambulatory Visit: Payer: Medicare PPO | Admitting: Cardiology

## 2021-04-23 DIAGNOSIS — I5022 Chronic systolic (congestive) heart failure: Secondary | ICD-10-CM

## 2021-04-23 DIAGNOSIS — I10 Essential (primary) hypertension: Secondary | ICD-10-CM

## 2021-04-23 DIAGNOSIS — I482 Chronic atrial fibrillation, unspecified: Secondary | ICD-10-CM

## 2021-04-23 DIAGNOSIS — Z720 Tobacco use: Secondary | ICD-10-CM

## 2021-06-07 NOTE — Progress Notes (Deleted)
Cardiology Office Note   Date:  06/07/2021   ID:  Jeffrey Martin, DOB 31-Aug-1950, MRN [id number]  PCP:  Jeffrey Pier, MD  Cardiologist:   Jeffrey Breeding, MD   No chief complaint on file.     History of Present Illness: Jeffrey Martin is a 71 y.o. male who presents for evaluation of cardiomyopathy and atrial fibrillation.   He's had a previous CVA. He's been treated with anticoagulation.  His EF is 30-35%. His course is complicated by alcohol, tobacco use, hypotension and  bradycardic. In the past his beta blocker was slightly reduced and his Lasix dose reduced.  He has had intermittent attendance at scheduled office appts.  He was followed in the [facility] .   ***  ***  Since he was last seen he is actually doing very well.  He does walking at least a mile and a half every day and he does not have any symptoms with this. The patient denies any new symptoms such as chest discomfort, neck or arm discomfort. There has been no new shortness of breath, PND or orthopnea. There have been no reported palpitations, presyncope or syncope.  He does not notice his fibrillation.  He tolerates his anticoagulation.   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 [facility])    Cerebrovascular accident (CVA) due to embolism of right middle cerebral artery (HCC)    CHF (congestive heart failure) (HCC)    Chronic anticoagulation    History of cardiovascular stress test    Lexiscan Myoview 7/16:  EF 27%, possible inferoapical ischemia; High Risk; no CAD at cath   Hyperlipidemia Dx July 2016   Hypertension Dx June 2016   NICM (nonischemic cardiomyopathy) ([place]) 11/2014   EF 30-35% by echo, 27% by MV, no CAD at cath   Peptic ulcer 1988   Stroke [facility])     Past Surgical History:  Procedure Laterality Date   CARDIAC CATHETERIZATION N/A 10/10/2014   Procedure: Right/Left Heart Cath and Coronary  Angiography;  Surgeon: Sherren Mocha, MD; normal coronary arteries, normal LVEDP, systemic hypertension    REPAIR OF PERFORATED ULCER       Current Outpatient Medications  Medication Sig Dispense Refill   amLODipine (NORVASC) 5 MG tablet TAKE 1 TABLET (5 MG TOTAL) BY MOUTH DAILY. 90 tablet 3   carvedilol (COREG) 12.5 MG tablet TAKE 1 AND 1/2 TABLETS BY MOUTH 2 (TWO) TIMES DAILY WITH A MEAL. 90 tablet 1   ELIQUIS 5 MG TABS tablet TAKE 1 TABLET BY MOUTH TWICE DAILY 180 tablet 1   furosemide (LASIX) 40 MG tablet TAKE 1/2 TABLET BY MOUTH DAILY. 15 tablet 11   losartan (COZAAR) 100 MG tablet TAKE 1 TABLET (100 MG TOTAL) BY MOUTH DAILY. 30 tablet 2   Multiple Vitamins-Minerals (MULTIVITAMIN & MINERAL PO) Take 1 tablet by mouth daily.     nicotine polacrilex (COMMIT) 2 MG lozenge Take 1 lozenge (2 mg total) by mouth as needed for smoking cessation. 100 tablet 0   simvastatin (ZOCOR) 20 MG tablet TAKE 1 TABLET (20 MG TOTAL) BY MOUTH DAILY. 30 tablet 2   No current facility-administered medications for this visit.    Allergies:   Patient has no known allergies.    ROS:  Please see the history of present illness.   Otherwise, review of systems are positive for positive for ***.   All other systems are reviewed and  negative.    PHYSICAL EXAM: VS:  There were no vitals taken for this visit. , BMI There is no height or weight on file to calculate BMI. GENERAL:  Well appearing NECK:  No jugular venous distention, waveform within normal limits, carotid upstroke brisk and symmetric, no bruits, no thyromegaly LUNGS:  Clear to auscultation bilaterally CHEST:  Unremarkable HEART:  PMI not displaced or sustained,S1 and S2 within normal limits, no S3, no S4, no clicks, no rubs, *** murmurs ABD:  Flat, positive bowel sounds normal in frequency in pitch, no bruits, no rebound, no guarding, no midline pulsatile mass, no hepatomegaly, no splenomegaly EXT:  2 plus pulses throughout, no edema, no cyanosis no  clubbing    ***GENERAL:  Well appearing NECK:  No jugular venous distention, waveform within normal limits, carotid upstroke brisk and symmetric, no bruits, no thyromegaly LUNGS:  Clear to auscultation bilaterally CHEST:  Unremarkable HEART:  PMI not displaced or sustained,S1 and S2 within normal limits, no S3, no clicks, no rubs, no murmurs, irregular  ABD:  Flat, positive bowel sounds normal in frequency in pitch, no bruits, no rebound, no guarding, no midline pulsatile mass, no hepatomegaly, no splenomegaly EXT:  2 plus pulses throughout, no edema, no cyanosis no clubbing   EKG:  EKG is *** ordered today. The ekg ordered today demonstrates atrial fibrillation, rate ***, axis within normal limits, intervals within normal limits, no acute ST-T wave changes.     Recent Labs: No results found for requested labs within last 8760 hours.    Lipid Panel    Component Value Date/Time   CHOL 107 03/22/2019 1620   TRIG 74 03/22/2019 1620   HDL 51 03/22/2019 1620   CHOLHDL 2.1 03/22/2019 1620   CHOLHDL 2.8 09/07/2014 0525   VLDL 10 09/07/2014 0525   LDLCALC 41 03/22/2019 1620      Wt Readings from Last 3 Encounters:  02/02/20 151 lb 3.2 oz (68.6 kg)  12/21/19 153 lb (69.4 kg)  10/04/19 148 lb 12.8 oz (67.5 kg)      Other studies Reviewed: Additional studies/ records that were reviewed today include:*** Review of the above records demonstrates:  Please see elsewhere in the note.     ASSESSMENT AND PLAN:  CARDIOMYOPATHY:   ***  Seems to be euvolemic.  His last ejection fraction was 45%.  I would not suspect any change clinically.  He will continue the meds as listed.   HTN: His blood pressure is *** well controlled.  I was going to stop the Norvasc previously but for some reason still on his list and his blood pressure is excellent so no change in therapy.    CVA:   He had a CVA previously but no residual.   *** No change in therapy.   ATRIAL FIB:  [name] has a  CHA2DS2 - VASc score of 5.  *** He tolerates anticoagulation.  He had a basic metabolic profile and CBC earlier this year.  He has good rate control.  No change in therapy.   ETOH:   *** He says he drinks alcohol very rarely.    TOBACCO:    ***  He says he will smoke a few cigarettes now and then.  Current medicines are reviewed at length with the patient today.  The patient does not have concerns regarding medicines.  The following changes have been made:  ***  Labs/ tests ordered today include: ***  No orders of the defined types were placed in  this encounter.    Disposition:   FU with with me in *** year.    Signed, Jeffrey Breeding, MD  06/07/2021 8:50 PM    [facility]

## 2021-06-08 ENCOUNTER — Ambulatory Visit: Payer: Medicare PPO | Admitting: Cardiology

## 2021-06-08 DIAGNOSIS — I482 Chronic atrial fibrillation, unspecified: Secondary | ICD-10-CM

## 2021-06-08 DIAGNOSIS — I10 Essential (primary) hypertension: Secondary | ICD-10-CM

## 2021-06-08 DIAGNOSIS — Z720 Tobacco use: Secondary | ICD-10-CM

## 2021-06-08 DIAGNOSIS — I255 Ischemic cardiomyopathy: Secondary | ICD-10-CM

## 2021-06-25 NOTE — Progress Notes (Deleted)
Office Visit    Patient Name: Jeffrey Martin Date of Encounter: 06/26/2021  Primary Care Provider:  Marcine Matar, MD Primary Cardiologist:  Rollene Rotunda, MD  Chief Complaint    71 year old male with a history of persistent atrial fibrillation, NICM, CVA, hypertension, hyperlipidemia, EtOH and tobacco use who presents for follow-up related to atrial fibrillation and NICM.   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 [facility])    Cerebrovascular accident (CVA) due to embolism of right middle cerebral artery (HCC)    CHF (congestive heart failure) (HCC)    Chronic anticoagulation    History of cardiovascular stress test    Lexiscan Myoview 7/16:  EF 27%, possible inferoapical ischemia; High Risk; no CAD at cath   Hyperlipidemia Dx July 2016   Hypertension Dx June 2016   NICM (nonischemic cardiomyopathy) (HCC) 11/2014   EF 30-35% by echo, 27% by MV, no CAD at cath   Peptic ulcer 1988   Stroke [facility])    Past Surgical History:  Procedure Laterality Date   CARDIAC CATHETERIZATION N/A 10/10/2014   Procedure: Right/Left Heart Cath and Coronary Angiography;  Surgeon: Tonny Bollman, MD; normal coronary arteries, normal LVEDP, systemic hypertension    REPAIR OF PERFORATED ULCER      Allergies  No Known Allergies  History of Present Illness    71 year old male with a history of persistent atrial fibrillation, NICM, CVA, hypertension, hyperlipidemia, EtOH and tobacco use.   He was diagnosed with atrial fibrillation in 2016.  Echocardiogram in June 2016 showed EF 35 to 40%, moderate diffuse hypokinesis, mild to moderate mitral valve regurgitation.  Lexiscan Myoview in July 2016 showed EF 27%, global hypokinesis, considered high risk.  Cardiac catheterization in August 2016 showed no evidence of CAD.  Deviously followed in the advanced HF clinic.  Additionally,  he has a history of CVA. Carotid dopplers in March 2017 showed 1 to 39% B ICA stenosis.  Most recent echocardiogram in July 2020 showed EF 40 to 45%, LV diffuse hypokinesis, moderate BAE, mild thickening of the aortic valve.  He was last seen in the office on 02/02/2020 and was doing well from a cardiac standpoint.  He was rate controlled with atrial fibrillation and tolerating Eliquis.  Follow-up was recommended in 1 year.  He has not been seen in the office since.  He presents today for follow-up.  Since his last visit  Persistent atrial fibrillation: NICM: Hypertension: Hyperlipidemia: H/o CVA: ETOH and tobacco use:  Disposition:   Home Medications    Current Outpatient Medications  Medication Sig Dispense Refill   amLODipine (NORVASC) 5 MG tablet TAKE 1 TABLET (5 MG TOTAL) BY MOUTH DAILY. 90 tablet 3   carvedilol (COREG) 12.5 MG tablet TAKE 1 AND 1/2 TABLETS BY MOUTH 2 (TWO) TIMES DAILY WITH A MEAL. 90 tablet 1   ELIQUIS 5 MG TABS tablet TAKE 1 TABLET BY MOUTH TWICE DAILY 180 tablet 1   furosemide (LASIX) 40 MG tablet TAKE 1/2 TABLET BY MOUTH DAILY. 15 tablet 11   losartan (COZAAR) 100 MG tablet TAKE 1 TABLET (100 MG TOTAL) BY MOUTH DAILY. 30 tablet 2   Multiple Vitamins-Minerals (MULTIVITAMIN & MINERAL PO) Take 1 tablet by mouth daily.     nicotine polacrilex (COMMIT) 2 MG lozenge Take 1 lozenge (2 mg total) by mouth as needed for smoking cessation. 100 tablet 0   simvastatin (  ZOCOR) 20 MG tablet TAKE 1 TABLET (20 MG TOTAL) BY MOUTH DAILY. 30 tablet 2   No current facility-administered medications for this visit.     Review of Systems    ***.  All other systems reviewed and are otherwise negative except as noted above.    Physical Exam    VS:  There were no vitals taken for this visit. , BMI There is no height or weight on file to calculate BMI.     GEN: Well nourished, well developed, in no acute distress. HEENT: normal. Neck: Supple, no JVD, carotid bruits, or  masses. Cardiac: RRR, no murmurs, rubs, or gallops. No clubbing, cyanosis, edema.  Radials/DP/PT 2+ and equal bilaterally.  Respiratory:  Respirations regular and unlabored, clear to auscultation bilaterally. GI: Soft, nontender, nondistended, BS + x 4. MS: no deformity or atrophy. Skin: warm and dry, no rash. Neuro:  Strength and sensation are intact. Psych: Normal affect.  Accessory Clinical Findings    ECG personally reviewed by me today - *** - no acute changes.  Lab Results  Component Value Date   WBC 4.6 03/22/2019   HGB 16.7 03/22/2019   HCT 50.2 03/22/2019   MCV 91 03/22/2019   PLT 172 03/22/2019   Lab Results  Component Value Date   CREATININE 1.45 (H) 03/22/2019   BUN 13 03/22/2019   NA 143 03/22/2019   K 4.8 03/22/2019   CL 105 03/22/2019   CO2 27 03/22/2019   Lab Results  Component Value Date   ALT 11 03/22/2019   AST 21 03/22/2019   ALKPHOS 140 (H) 03/22/2019   BILITOT 0.7 03/22/2019   Lab Results  Component Value Date   CHOL 107 03/22/2019   HDL 51 03/22/2019   LDLCALC 41 03/22/2019   TRIG 74 03/22/2019   CHOLHDL 2.1 03/22/2019    Lab Results  Component Value Date   HGBA1C 6.1 (H) 09/07/2014    Assessment & Plan    1.  ***   Joylene Grapes, NP 06/26/2021, 8:40 AM

## 2021-06-26 ENCOUNTER — Ambulatory Visit: Payer: Medicare PPO | Admitting: Cardiology

## 2021-06-26 ENCOUNTER — Ambulatory Visit: Payer: Medicare PPO | Admitting: Nurse Practitioner

## 2021-07-11 DIAGNOSIS — Z1152 Encounter for screening for COVID-19: Secondary | ICD-10-CM | POA: Diagnosis not present

## 2021-07-16 NOTE — Progress Notes (Deleted)
Office Visit    Patient Name: Jeffrey Martin Date of Encounter: 07/16/2021  Primary Care Provider:  Ladell Pier, MD Primary Cardiologist:  Minus Breeding, MD  Chief Complaint    71 year old male with a history of permanent atrial fibrillation, NICM, CVA, hypertension, hyperlipidemia, EtOH and tobacco use who presents for follow-up related to atrial fibrillation.   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 [facility])    Cerebrovascular accident (CVA) due to embolism of right middle cerebral artery (HCC)    CHF (congestive heart failure) (HCC)    Chronic anticoagulation    History of cardiovascular stress test    Lexiscan Myoview 7/16:  EF 27%, possible inferoapical ischemia; High Risk; no CAD at cath   Hyperlipidemia Dx July 2016   Hypertension Dx June 2016   NICM (nonischemic cardiomyopathy) ([place]) 11/2014   EF 30-35% by echo, 27% by MV, no CAD at cath   Peptic ulcer 1988   Stroke [facility])    Past Surgical History:  Procedure Laterality Date   CARDIAC CATHETERIZATION N/A 10/10/2014   Procedure: Right/Left Heart Cath and Coronary Angiography;  Surgeon: Sherren Mocha, MD; normal coronary arteries, normal LVEDP, systemic hypertension    REPAIR OF PERFORATED ULCER      Allergies  No Known Allergies  History of Present Illness   71 year old male with the above past medical history including permanent atrial fibrillation, NICM, CVA, hypertension, hyperlipidemia, EtOH and tobacco use.    He was diagnosed with atrial fibrillation in 2016.  Echocardiogram in June 2016 showed EF 35 to 40%, moderate diffuse hypokinesis, mild to moderate mitral valve regurgitation.  Lexiscan Myoview in July 2016 showed EF 27%, global hypokinesis, considered high risk.  Cardiac catheterization in August 2016 showed no evidence of CAD.  Deviously followed in the advanced HF  clinic.  Additionally, he has a history of CVA. Carotid dopplers in March 2017 showed 1 to 39% B ICA stenosis.  Most recent echocardiogram in July 2020 showed EF 40 to 45%, LV diffuse hypokinesis, moderate BAE, mild thickening of the aortic valve.  He was last seen in the office on 02/02/2020 and was doing well from a cardiac standpoint.  He was rate controlled with atrial fibrillation and tolerating Eliquis.  Follow-up was recommended in 1 year.  He has not been seen in the office since. He presents today for follow-up.  Since his last visit   Permanent atrial fibrillation: NICM: Hypertension: Hyperlipidemia: H/o CVA: ETOH and tobacco use:  Disposition:    Home Medications    Current Outpatient Medications  Medication Sig Dispense Refill   amLODipine (NORVASC) 5 MG tablet TAKE 1 TABLET (5 MG TOTAL) BY MOUTH DAILY. 90 tablet 3   carvedilol (COREG) 12.5 MG tablet TAKE 1 AND 1/2 TABLETS BY MOUTH 2 (TWO) TIMES DAILY WITH A MEAL. 90 tablet 1   ELIQUIS 5 MG TABS tablet TAKE 1 TABLET BY MOUTH TWICE DAILY 180 tablet 1   furosemide (LASIX) 40 MG tablet TAKE 1/2 TABLET BY MOUTH DAILY. 15 tablet 11   losartan (COZAAR) 100 MG tablet TAKE 1 TABLET (100 MG TOTAL) BY MOUTH DAILY. 30 tablet 2   Multiple Vitamins-Minerals (MULTIVITAMIN & MINERAL PO) Take 1 tablet by mouth daily.     nicotine polacrilex (COMMIT) 2 MG lozenge Take 1 lozenge (2 mg total) by mouth as needed for smoking cessation. 100 tablet 0  simvastatin (ZOCOR) 20 MG tablet TAKE 1 TABLET (20 MG TOTAL) BY MOUTH DAILY. 30 tablet 2   No current facility-administered medications for this visit.     Review of Systems    ***.  All other systems reviewed and are otherwise negative except as noted above.    Physical Exam    VS:  There were no vitals taken for this visit. , BMI There is no height or weight on file to calculate BMI.     GEN: Well nourished, well developed, in no acute distress. HEENT: normal. Neck: Supple, no JVD, carotid  bruits, or masses. Cardiac: RRR, no murmurs, rubs, or gallops. No clubbing, cyanosis, edema.  Radials/DP/PT 2+ and equal bilaterally.  Respiratory:  Respirations regular and unlabored, clear to auscultation bilaterally. GI: Soft, nontender, nondistended, BS + x 4. MS: no deformity or atrophy. Skin: warm and dry, no rash. Neuro:  Strength and sensation are intact. Psych: Normal affect.  Accessory Clinical Findings    ECG personally reviewed by me today - *** - no acute changes.  Lab Results  Component Value Date   WBC 4.6 03/22/2019   HGB 16.7 03/22/2019   HCT 50.2 03/22/2019   MCV 91 03/22/2019   PLT 172 03/22/2019   Lab Results  Component Value Date   CREATININE 1.45 (H) 03/22/2019   BUN 13 03/22/2019   NA 143 03/22/2019   K 4.8 03/22/2019   CL 105 03/22/2019   CO2 27 03/22/2019   Lab Results  Component Value Date   ALT 11 03/22/2019   AST 21 03/22/2019   ALKPHOS 140 (H) 03/22/2019   BILITOT 0.7 03/22/2019   Lab Results  Component Value Date   CHOL 107 03/22/2019   HDL 51 03/22/2019   LDLCALC 41 03/22/2019   TRIG 74 03/22/2019   CHOLHDL 2.1 03/22/2019    Lab Results  Component Value Date   HGBA1C 6.1 (H) 09/07/2014    Assessment & Plan    1.  ***   Lenna Sciara, NP 07/16/2021, 12:34 PM

## 2021-07-17 ENCOUNTER — Ambulatory Visit: Payer: Medicare PPO | Admitting: Nurse Practitioner

## 2021-07-17 DIAGNOSIS — Z789 Other specified health status: Secondary | ICD-10-CM

## 2021-07-17 DIAGNOSIS — I4821 Permanent atrial fibrillation: Secondary | ICD-10-CM

## 2021-07-17 DIAGNOSIS — Z720 Tobacco use: Secondary | ICD-10-CM

## 2021-07-17 DIAGNOSIS — I420 Dilated cardiomyopathy: Secondary | ICD-10-CM

## 2021-07-17 DIAGNOSIS — E782 Mixed hyperlipidemia: Secondary | ICD-10-CM

## 2021-07-17 DIAGNOSIS — I10 Essential (primary) hypertension: Secondary | ICD-10-CM

## 2021-07-17 DIAGNOSIS — Z8673 Personal history of transient ischemic attack (TIA), and cerebral infarction without residual deficits: Secondary | ICD-10-CM

## 2021-07-18 DIAGNOSIS — Z1152 Encounter for screening for COVID-19: Secondary | ICD-10-CM | POA: Diagnosis not present

## 2021-07-23 ENCOUNTER — Encounter: Payer: Self-pay | Admitting: Nurse Practitioner

## 2021-07-31 DIAGNOSIS — Z20822 Contact with and (suspected) exposure to covid-19: Secondary | ICD-10-CM | POA: Diagnosis not present

## 2021-08-03 DIAGNOSIS — I63231 Cerebral infarction due to unspecified occlusion or stenosis of right carotid arteries: Secondary | ICD-10-CM | POA: Diagnosis not present

## 2021-08-03 DIAGNOSIS — R2681 Unsteadiness on feet: Secondary | ICD-10-CM | POA: Diagnosis not present

## 2021-08-03 DIAGNOSIS — I480 Paroxysmal atrial fibrillation: Secondary | ICD-10-CM | POA: Diagnosis not present

## 2021-08-03 DIAGNOSIS — Z0000 Encounter for general adult medical examination without abnormal findings: Secondary | ICD-10-CM | POA: Diagnosis not present

## 2021-08-03 DIAGNOSIS — I10 Essential (primary) hypertension: Secondary | ICD-10-CM | POA: Diagnosis not present

## 2021-08-03 DIAGNOSIS — Z0001 Encounter for general adult medical examination with abnormal findings: Secondary | ICD-10-CM | POA: Diagnosis not present

## 2021-08-08 DIAGNOSIS — Z1152 Encounter for screening for COVID-19: Secondary | ICD-10-CM | POA: Diagnosis not present

## 2021-11-22 ENCOUNTER — Encounter: Payer: Self-pay | Admitting: Physician Assistant

## 2021-11-22 ENCOUNTER — Other Ambulatory Visit: Payer: Self-pay

## 2021-11-22 ENCOUNTER — Ambulatory Visit: Payer: Medicare Other | Attending: Physician Assistant | Admitting: Physician Assistant

## 2021-11-22 VITALS — BP 116/82 | HR 61 | Ht 67.0 in | Wt 133.0 lb

## 2021-11-22 DIAGNOSIS — R7303 Prediabetes: Secondary | ICD-10-CM | POA: Diagnosis not present

## 2021-11-22 DIAGNOSIS — Z23 Encounter for immunization: Secondary | ICD-10-CM | POA: Diagnosis not present

## 2021-11-22 DIAGNOSIS — I5022 Chronic systolic (congestive) heart failure: Secondary | ICD-10-CM | POA: Diagnosis not present

## 2021-11-22 DIAGNOSIS — I10 Essential (primary) hypertension: Secondary | ICD-10-CM | POA: Diagnosis not present

## 2021-11-22 DIAGNOSIS — E785 Hyperlipidemia, unspecified: Secondary | ICD-10-CM | POA: Diagnosis not present

## 2021-11-22 DIAGNOSIS — I480 Paroxysmal atrial fibrillation: Secondary | ICD-10-CM | POA: Diagnosis not present

## 2021-11-22 MED ORDER — APIXABAN 5 MG PO TABS
5.0000 mg | ORAL_TABLET | Freq: Two times a day (BID) | ORAL | 1 refills | Status: DC
  Filled 2021-11-22: qty 180, 90d supply, fill #0
  Filled 2022-07-05: qty 180, 90d supply, fill #1

## 2021-11-22 MED ORDER — FUROSEMIDE 20 MG PO TABS
10.0000 mg | ORAL_TABLET | Freq: Every day | ORAL | 3 refills | Status: DC
  Filled 2021-11-22: qty 15, 30d supply, fill #0
  Filled 2021-12-27: qty 15, 30d supply, fill #1
  Filled 2022-02-01: qty 15, 30d supply, fill #2
  Filled 2022-07-05: qty 15, 30d supply, fill #3

## 2021-11-22 MED ORDER — SIMVASTATIN 20 MG PO TABS
20.0000 mg | ORAL_TABLET | Freq: Every day | ORAL | 2 refills | Status: DC
  Filled 2021-11-22: qty 30, 30d supply, fill #0
  Filled 2021-12-27: qty 30, 30d supply, fill #1
  Filled 2022-02-01: qty 30, 30d supply, fill #2

## 2021-11-22 MED ORDER — CARVEDILOL 12.5 MG PO TABS
12.5000 mg | ORAL_TABLET | Freq: Two times a day (BID) | ORAL | 1 refills | Status: DC
  Filled 2021-11-22: qty 180, 90d supply, fill #0
  Filled 2022-07-05: qty 180, 90d supply, fill #1

## 2021-11-22 MED ORDER — LOSARTAN POTASSIUM 25 MG PO TABS
12.5000 mg | ORAL_TABLET | Freq: Every day | ORAL | 1 refills | Status: DC
  Filled 2021-11-22 (×2): qty 45, 90d supply, fill #0
  Filled 2022-02-01: qty 45, 90d supply, fill #1
  Filled 2022-07-05: qty 45, 90d supply, fill #2

## 2021-11-22 NOTE — Progress Notes (Signed)
Patient ID: Jeffrey Martin, male   DOB: May 01, 1950, 71 y.o.   MRN: [id number]   Jeffrey Martin, is a 71 y.o. male  [id number]  [id number]  DOB - [birth year]  Chief Complaint  Patient presents with   Hypertension    HTN f/u. No questions/ concerns.  Med refill. Yes flu vax.       Subjective:   Jeffrey Martin is a 71 y.o. male here today for med RF.  His insurance company came out for screening and found the following: 11/13/2021 122/85 A1C=5.9 This is with him ONLY taking carvedilol.    No SOB/CP/dizziness  He is prescribed-losartan 100, carvediolol 12.5 1.5 tabs bid, lasix 40, amlodipine 5, simvistatin 20mg ,  and eliquis 5mg  bid.  He is feeling fine.  No issues or concerns.  No labs in a long time  No problems updated.  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 [facility])    Cerebrovascular accident (CVA) due to embolism of right middle cerebral artery (HCC)    CHF (congestive heart failure) (HCC)    Chronic anticoagulation    History of cardiovascular stress test    Lexiscan Myoview 7/16:  EF 27%, possible inferoapical ischemia; High Risk; no CAD at cath   Hyperlipidemia Dx July 2016   Hypertension Dx June 2016   NICM (nonischemic cardiomyopathy) ([place]) 11/2014   EF 30-35% by echo, 27% by MV, no CAD at cath   Peptic ulcer 1988   Stroke [facility])     MEDICATIONS AT HOME: Prior to Admission medications   Medication Sig Start Date End Date Taking? Authorizing Provider  furosemide (LASIX) 20 MG tablet Take 0.5 tablets (10 mg total) by mouth daily. 11/22/21  Yes Wendolyn Raso M, PA-C  losartan (COZAAR) 25 MG tablet Take 0.5 tablets (12.5 mg total) by mouth daily. 11/22/21  Yes Keanon Bevins, Dionne Bucy, PA-C  Multiple Vitamins-Minerals (MULTIVITAMIN & MINERAL PO) Take 1 tablet by mouth daily.   Yes [provider]   nicotine polacrilex (COMMIT) 2 MG lozenge Take 1 lozenge (2 mg total) by mouth as needed for smoking cessation. 12/10/19  Yes Ladell Pier, MD  apixaban (ELIQUIS) 5 MG TABS tablet Take 1 tablet (5 mg total) by mouth 2 (two) times daily. 11/22/21   Argentina Donovan, PA-C  carvedilol (COREG) 12.5 MG tablet Take 1 tablet (12.5 mg total) by mouth 2 (two) times daily with a meal. 11/22/21 11/22/22  Casimira Sutphin, Dionne Bucy, PA-C  simvastatin (ZOCOR) 20 MG tablet TAKE 1 TABLET (20 MG TOTAL) BY MOUTH DAILY. 11/22/21 11/22/22  Argentina Donovan, PA-C    ROS: Neg HEENT Neg resp Neg cardiac Neg GI Neg GU Neg MS Neg psych Neg neuro  Objective:   Vitals:   11/22/21 1346  BP: 116/82  Pulse: 61  SpO2: 95%  Weight: 133 lb (60.3 kg)  Height: 5\' 7"  (1.702 m)   Exam General appearance : Awake, alert, not in any distress. Speech Clear. Not toxic looking HEENT: Atraumatic and Normocephalic Neck: Supple, no JVD. No cervical lymphadenopathy.  Chest: Good air entry bilaterally, CTAB.  No rales/rhonchi/wheezing CVS: S1 S2 regular, no murmurs.  Extremities: B/L Lower Ext shows no edema, both legs are warm to touch Neurology: Awake alert, and oriented X 3, CN II-XII intact, Non focal Skin: No Rash  Data Review Lab Results  Component Value Date   HGBA1C 6.1 (H)  09/07/2014    Assessment & Plan   1. Paroxysmal atrial fibrillation (HCC) Due to his BP being so good on only one med, I am adjusting doses on all.  I will change carvedilol to 1 tab bid instead of 1.5 bid.   - apixaban (ELIQUIS) 5 MG TABS tablet; Take 1 tablet (5 mg total) by mouth 2 (two) times daily.  Dispense: 180 tablet; Refill: 1 - carvedilol (COREG) 12.5 MG tablet; Take 1 tablet (12.5 mg total) by mouth 2 (two) times daily with a meal.  Dispense: 180 tablet; Refill: 1  2. Hyperlipidemia, unspecified hyperlipidemia type Resume med - simvastatin (ZOCOR) 20 MG tablet; TAKE 1 TABLET (20 MG TOTAL) BY MOUTH DAILY.  Dispense: 30 tablet;  Refill: 2 - Comprehensive metabolic panel - Lipid panel  3. Essential hypertension Drastic reduction to prevent hypotension(bc he has not been taking it at all) but still need benefit of ARB - losartan (COZAAR) 25 MG tablet; Take 0.5 tablets (12.5 mg total) by mouth daily.  Dispense: 90 tablet; Refill: 1 - Comprehensive metabolic panel - Lipid panel I stopped amlodipine for now and may resume at lower dose if BP can support.    4. Chronic systolic CHF (congestive heart failure), NYHA class 2 (HCC) No edema.  He was prescribed 40-I will decrease this to 10  and recheck BP at f/up - furosemide (LASIX) 20 MG tablet; Take 0.5 tablets (10 mg total) by mouth daily.  Dispense: 15 tablet; Refill: 3 - Comprehensive metabolic panel - Lipid panel  5. Prediabetes-5.9 A1C by insurance company  Collaborated with Allie Dimmer D on plan and follow up  Return in about 5 weeks (around 12/27/2021) for with [facility] for htn/BMP; 3 months with PCP.  The patient was given clear instructions to go to ER or return to medical center if symptoms don't improve, worsen or new problems develop. The patient verbalized understanding. The patient was told to call to get lab results if they haven't heard anything in the next week.      Georgian Co, PA-C [facility] [place], [place] [phone]   11/22/2021, 2:18 PM

## 2021-11-23 LAB — LIPID PANEL
Chol/HDL Ratio: 2.3 ratio (ref 0.0–5.0)
Cholesterol, Total: 131 mg/dL (ref 100–199)
HDL: 56 mg/dL (ref >39)
LDL Chol Calc (NIH): 62 mg/dL (ref 0–99)
Triglycerides: 64 mg/dL (ref 0–149)
VLDL Cholesterol Cal: 13 mg/dL (ref 5–40)

## 2021-11-23 LAB — COMPREHENSIVE METABOLIC PANEL
ALT: 9 IU/L (ref 0–44)
AST: 19 IU/L (ref 0–40)
Albumin/Globulin Ratio: 1.3 (ref 1.2–2.2)
Albumin: 3.9 g/dL (ref 3.9–4.9)
Alkaline Phosphatase: 145 IU/L — ABNORMAL HIGH (ref 44–121)
BUN/Creatinine Ratio: 15 (ref 10–24)
BUN: 20 mg/dL (ref 8–27)
Bilirubin Total: 0.3 mg/dL (ref 0.0–1.2)
CO2: 24 mmol/L (ref 20–29)
Calcium: 9.1 mg/dL (ref 8.6–10.2)
Chloride: 102 mmol/L (ref 96–106)
Creatinine, Ser: 1.31 mg/dL — ABNORMAL HIGH (ref 0.76–1.27)
Globulin, Total: 3 g/dL (ref 1.5–4.5)
Glucose: 83 mg/dL (ref 70–99)
Potassium: 4.7 mmol/L (ref 3.5–5.2)
Sodium: 140 mmol/L (ref 134–144)
Total Protein: 6.9 g/dL (ref 6.0–8.5)
eGFR: 59 mL/min/{1.73_m2} — ABNORMAL LOW (ref >59)

## 2021-12-26 NOTE — Progress Notes (Unsigned)
S:    PCP: Jonah Blue 71 y.o. male who presents for diabetes evaluation, education, and management. PMH is significant for CHF, Afib, CVA, HLD, HTN, and pre-DM.  Patient was referred and last seen by Primary Care Provider, PA Georgian Co, on 10/10. At last visit, BP was 116/82 and reported he was only taking carvedilol. His amlodipine was discontinued and his carvedilol, Lasix, and losartan doses were reduced. A1c was 5.9 putting him in the pre-diabetic range.    Today, patient arrives in *** good spirits and presents without *** any assistance. ***  Patient reports Diabetes was diagnosed in ***.   Family/Social History: ***  Current diabetes medications include: none Current hypertension medications include: carvedilol 12.5mg  BID, losartan 25mg  1/2 tablet once daily Current hyperlipidemia medications include: simvastatin 20mg  once daily  Patient reports adherence to taking all medications as prescribed.  *** Patient denies adherence with medications, reports missing *** medications *** times per week, on average.  Insurance coverage:  Patient {Actions; denies-reports:120008} hypoglycemic events.  Reported home fasting blood sugars: ***  Reported 2 hour post-meal/random blood sugars: ***.  Patient {Actions; denies-reports:120008} nocturia (nighttime urination).  Patient {Actions; denies-reports:120008} neuropathy (nerve pain). Patient {Actions; denies-reports:120008} visual changes. Patient {Actions; denies-reports:120008} self foot exams.   Patient reported dietary habits: Eats *** meals/day Breakfast: *** Lunch: *** Dinner: *** Snacks: *** Drinks: ***   Patient-reported exercise habits: ***   O:  Lab Results  Component Value Date   HGBA1C 6.1 (H) 09/07/2014   There were no vitals filed for this visit.  Lipid Panel     Component Value Date/Time   CHOL 131 11/22/2021 1428   TRIG 64 11/22/2021 1428   HDL 56 11/22/2021 1428    CHOLHDL 2.3 11/22/2021 1428   CHOLHDL 2.8 09/07/2014 0525   VLDL 10 09/07/2014 0525   LDLCALC 62 11/22/2021 1428    Clinical Atherosclerotic Cardiovascular Disease (ASCVD): Yes  The ASCVD Risk score (Arnett DK, et al., 2019) failed to calculate for the following reasons:   The patient has a prior MI or stroke diagnosis   A/P: Diabetes longstanding *** currently ***. Patient is *** able to verbalize appropriate hypoglycemia management plan. Medication adherence appears ***. Control is suboptimal due to ***. -{Meds adjust:18428} basal insulin *** (insulin ***). Patient will continue to titrate 1 unit every *** days if fasting blood sugar > 100mg /dl until fasting blood sugars reach goal or next visit.  -{Meds adjust:18428} rapid insulin *** (insulin ***) to ***.  -{Meds adjust:18428} GLP-1 *** (generic ***) to ***.  -{Meds adjust:18428} SGLT2-I *** (generic ***) to ***. Counseled on sick day rules. -{Meds adjust:18428} metformin *** to ***.  -Patient educated on purpose, proper use, and potential adverse effects of ***.  -Extensively discussed pathophysiology of diabetes, recommended lifestyle interventions, dietary effects on blood sugar control.  -Counseled on s/sx of and management of hypoglycemia.  -Next A1c anticipated ***.   ASCVD risk - primary ***secondary prevention in patient with diabetes. Last LDL is *** not at goal of 01/22/2022 *** mg/dL. ASCVD risk factors include *** and 10-year ASCVD risk score of ***. {Desc; low/moderate/high:110033} intensity statin indicated.  -{Meds adjust:18428} ***statin *** mg.   Hypertension longstanding *** currently ***. Blood pressure goal of <130/80 *** mmHg. Medication adherence ***. Blood pressure control is suboptimal due to ***. -***  Written patient instructions provided. Patient verbalized understanding of treatment plan.  Total time in face to face counseling *** minutes.    Follow-up:  Pharmacist ***.  PCP clinic visit in  January  Irish Elders, PharmD PGY-1 [facility] Pharmacy Resident

## 2021-12-27 ENCOUNTER — Other Ambulatory Visit: Payer: Self-pay

## 2021-12-27 ENCOUNTER — Encounter: Payer: Self-pay | Admitting: Pharmacist

## 2021-12-27 ENCOUNTER — Ambulatory Visit: Payer: Medicare Other | Attending: Family Medicine | Admitting: Pharmacist

## 2021-12-27 VITALS — BP 130/83 | HR 69

## 2021-12-27 DIAGNOSIS — I10 Essential (primary) hypertension: Secondary | ICD-10-CM

## 2021-12-27 DIAGNOSIS — I5022 Chronic systolic (congestive) heart failure: Secondary | ICD-10-CM | POA: Diagnosis not present

## 2021-12-27 MED ORDER — DAPAGLIFLOZIN PROPANEDIOL 10 MG PO TABS
10.0000 mg | ORAL_TABLET | Freq: Every day | ORAL | 2 refills | Status: DC
  Filled 2021-12-27: qty 30, 30d supply, fill #0
  Filled 2022-02-01: qty 30, 30d supply, fill #1
  Filled 2022-07-05: qty 30, 30d supply, fill #2

## 2021-12-27 NOTE — Patient Instructions (Addendum)
Thank you for coming to see [place] today.   Blood pressure today is well-controlled.  Continue taking your medications.   Apixaban (Eliquis): take 1 tablet in the morning and 1 tablet in the evening.  Carvedilol 12.5mg : take 1 tablet in the morning and 1 tablet in the evening.  Farxiga 10 mg: take 1 tablet daily in the morning. Furosemide (Lasix): take 1/2 tablet daily in the morning.  Losartan 25 mg: take 1/2 tablet daily in the morning.  Simvastatin 20 mg: take 1 tablet daily in the morning.    Limiting salt and caffeine, as well as exercising as able for at least 30 minutes for 5 days out of the week, can also help you lower your blood pressure.  Take your blood pressure at home if you are able. Please write down these numbers and bring them to your visits.  If you have any questions about medications, please call me [phone].  Franky Macho

## 2021-12-28 ENCOUNTER — Other Ambulatory Visit: Payer: Self-pay

## 2022-02-01 ENCOUNTER — Other Ambulatory Visit: Payer: Self-pay

## 2022-02-04 ENCOUNTER — Ambulatory Visit: Payer: Medicare Other | Attending: Internal Medicine | Admitting: Pharmacist

## 2022-02-04 VITALS — BP 112/77 | HR 82

## 2022-02-04 DIAGNOSIS — I5022 Chronic systolic (congestive) heart failure: Secondary | ICD-10-CM | POA: Diagnosis not present

## 2022-02-04 NOTE — Progress Notes (Signed)
    S:   PCP: Jonah Blue 71 y.o. male who presents for HTN evaluation, education, and management. PMH is significant for CHF, Afib, CVA, HLD, HTN, and pre-DM. Patient was referred and last seen by Primary Care Provider, PA Georgian Co, on 11/22/2021. Last seen by pharmacy clinic on 12/27/2021 where Farxiga 10 mg was started.   Today, patient arrives in good spirits and presents with the assistance of a cane. Denies any chest pain, HA, blurred vision, or dizziness. Denies PND/orthopnea. Breathing is at his baseline and he denies s/sx of fluid overload. Denies side effects related to [place], no issues obtaining medication. Initial BP slightly elevated- 136/92 mmHg (likely due to some level of SOB with exertion). Recheck was at goal 112/77 mmHg. Of note, patient was last seen by cardiology in December 2021 and does not have follow up on file.   Patient reports HTN is longstanding.   Family/Social History:  -Fhx: HTN, stroke -Tobacco: some day smoker  -Alcohol: none reported   Current CHF medications include: carvedilol 12.5mg  BID, losartan 12.5 mg daily, Farxiga 10 mg daily, Lasix 10 mg daily.  Current hyperlipidemia medications include: simvastatin 20mg  once daily  Patient reports adherence to taking all medications as prescribed.   Insurance coverage:  Reported home blood pressure: not checking  Patient reported dietary habits: -Compliant with sodium and fluid restriction. -Has comorbid HFimpEF (last EF was 40-45% in 2020, up from 30-35% at dx)  Patient-reported exercise habits: limited    O:  Lab Results  Component Value Date   HGBA1C 6.1 (H) 09/07/2014   Vitals:   02/04/22 1438 02/04/22 1443  BP: (!) 136/92 112/77  Pulse: 82      Lipid Panel     Component Value Date/Time   CHOL 131 11/22/2021 1428   TRIG 64 11/22/2021 1428   HDL 56 11/22/2021 1428   CHOLHDL 2.3 11/22/2021 1428   CHOLHDL 2.8 09/07/2014 0525   VLDL 10 09/07/2014 0525    LDLCALC 62 11/22/2021 1428    Clinical Atherosclerotic Cardiovascular Disease (ASCVD): Yes  The ASCVD Risk score (Arnett DK, et al., 2019) failed to calculate for the following reasons:   The patient has a prior MI or stroke diagnosis   A/P: HTN longstanding currently at goal. BP goal is <130/80 mmHg. Medication adherence appears appropriate. Discussed with patient the importance of scheduling a visit with cardiology (last cardiology visit 01/2020, last echo 08/2018). Provided number for cardiology office and requested patient schedule an appointment with his HF cardiologist for further GDMT titration.  -Continued current antihypertensive regimen given BP at goal.  -Continue Farxiga 10 mg daily. CMET today pending.  -Patient educated on purpose, proper use, and potential adverse effects of 09/2018.  -Extensively discussed pathophysiology of HTN, recommended lifestyle interventions, dietary effects on HTN control.  -Counseled on s/sx of and management of hypotension.   Written patient instructions provided. Patient verbalized understanding of treatment plan.  Total time in face to face counseling 25 minutes.    Follow-up:  Pharmacist in 1 month. PCP clinic visit in January  February, Valeda Malm.D. PGY-2 [facility] Resident 02/04/2022 3:50 PM

## 2022-02-05 LAB — CMP14+EGFR
ALT: 11 IU/L (ref 0–44)
AST: 17 IU/L (ref 0–40)
Albumin/Globulin Ratio: 1.3 (ref 1.2–2.2)
Albumin: 3.6 g/dL — ABNORMAL LOW (ref 3.9–4.9)
Alkaline Phosphatase: 114 IU/L (ref 44–121)
BUN/Creatinine Ratio: 13 (ref 10–24)
BUN: 19 mg/dL (ref 8–27)
Bilirubin Total: 0.5 mg/dL (ref 0.0–1.2)
CO2: 25 mmol/L (ref 20–29)
Calcium: 9.1 mg/dL (ref 8.6–10.2)
Chloride: 107 mmol/L — ABNORMAL HIGH (ref 96–106)
Creatinine, Ser: 1.45 mg/dL — ABNORMAL HIGH (ref 0.76–1.27)
Globulin, Total: 2.8 g/dL (ref 1.5–4.5)
Glucose: 88 mg/dL (ref 70–99)
Potassium: 4.2 mmol/L (ref 3.5–5.2)
Sodium: 143 mmol/L (ref 134–144)
Total Protein: 6.4 g/dL (ref 6.0–8.5)
eGFR: 52 mL/min/{1.73_m2} — ABNORMAL LOW (ref >59)

## 2022-02-05 NOTE — Progress Notes (Signed)
Spoke with patient in reference to lab results. Informed patient of the follow comments and advise given by Dr. Laural Benes , kidney function is not at 100% and slightly decreased further compared to when checked in October of this year.  Make sure that he is drinking adequate fluids that is several glasses of water daily.  Keep his follow-up appointment with me in January.  Avoid taking any over-the-counter pain medications like ibuprofen, Aleve, Advil, Motrin as these can make kidney function worse. Patient  denies having and questions  and verbalized understanding of all discussed.

## 2022-02-26 ENCOUNTER — Ambulatory Visit: Payer: Medicare HMO | Admitting: Internal Medicine

## 2022-05-07 ENCOUNTER — Ambulatory Visit: Payer: Medicare HMO | Admitting: Internal Medicine

## 2022-06-28 ENCOUNTER — Telehealth: Payer: Self-pay | Admitting: Internal Medicine

## 2022-06-28 NOTE — Telephone Encounter (Signed)
Copied from CRM [id number]. Topic: Medicare AWV >> Jun 28, 2022 12:51 PM Rushie Goltz wrote: Reason for CRM: Called patient to schedule Medicare Annual Wellness Visit (AWV). Left message for patient to call back and schedule Medicare Annual Wellness Visit (AWV).  Last date of AWV: AWVI eligible as of 01/18/2018  Please schedule an AWVI appointment at any time with [facility] VISIT.  If any questions, please contact me at [phone].    Thank you,  [facility] Support [facility] Direct dial  [phone]

## 2022-07-05 ENCOUNTER — Other Ambulatory Visit: Payer: Self-pay

## 2022-07-05 ENCOUNTER — Other Ambulatory Visit: Payer: Self-pay | Admitting: Physician Assistant

## 2022-07-05 DIAGNOSIS — E785 Hyperlipidemia, unspecified: Secondary | ICD-10-CM

## 2022-07-05 MED ORDER — SIMVASTATIN 20 MG PO TABS
20.0000 mg | ORAL_TABLET | Freq: Every day | ORAL | 0 refills | Status: DC
  Filled 2022-07-05: qty 90, 90d supply, fill #0

## 2022-07-05 NOTE — Telephone Encounter (Signed)
Requested Prescriptions  Pending Prescriptions Disp Refills   simvastatin (ZOCOR) 20 MG tablet 90 tablet 0    Sig: TAKE 1 TABLET (20 MG TOTAL) BY MOUTH DAILY.     Cardiovascular:  Antilipid - Statins Failed - 07/05/2022  2:19 PM      Failed - Lipid Panel in normal range within the last 12 months    Cholesterol, Total  Date Value Ref Range Status  11/22/2021 131 100 - 199 mg/dL Final   LDL Chol Calc (NIH)  Date Value Ref Range Status  11/22/2021 62 0 - 99 mg/dL Final   HDL  Date Value Ref Range Status  11/22/2021 56 >39 mg/dL Final   Triglycerides  Date Value Ref Range Status  11/22/2021 64 0 - 149 mg/dL Final         Passed - Patient is not pregnant      Passed - Valid encounter within last 12 months    Recent Outpatient Visits           5 months ago Chronic systolic CHF (congestive heart failure), NYHA class 2 (HCC)   [place] [facility] [place], Cornelius Moras, RPH-CPP   6 months ago Essential hypertension   [facility] [place], Cornelius Moras, RPH-CPP   7 months ago Essential hypertension   [place] [facility] [place], [place], [place]   2 years ago Essential hypertension   [place] [facility] Marcine Matar, MD   2 years ago Need for influenza vaccination   [facility] [place], Cornelius Moras, RPH-CPP       Future Appointments             In 6 days Marcine Matar, MD [facility]

## 2022-07-11 ENCOUNTER — Other Ambulatory Visit: Payer: Self-pay

## 2022-07-11 ENCOUNTER — Ambulatory Visit: Payer: Medicare HMO | Attending: Internal Medicine | Admitting: Internal Medicine

## 2022-07-11 ENCOUNTER — Encounter: Payer: Self-pay | Admitting: Internal Medicine

## 2022-07-11 VITALS — BP 125/86 | HR 84 | Ht 67.0 in | Wt 133.0 lb

## 2022-07-11 DIAGNOSIS — I480 Paroxysmal atrial fibrillation: Secondary | ICD-10-CM | POA: Diagnosis not present

## 2022-07-11 DIAGNOSIS — I10 Essential (primary) hypertension: Secondary | ICD-10-CM | POA: Diagnosis not present

## 2022-07-11 DIAGNOSIS — I5022 Chronic systolic (congestive) heart failure: Secondary | ICD-10-CM

## 2022-07-11 DIAGNOSIS — E785 Hyperlipidemia, unspecified: Secondary | ICD-10-CM

## 2022-07-11 DIAGNOSIS — Z1211 Encounter for screening for malignant neoplasm of colon: Secondary | ICD-10-CM

## 2022-07-11 DIAGNOSIS — Z125 Encounter for screening for malignant neoplasm of prostate: Secondary | ICD-10-CM

## 2022-07-11 DIAGNOSIS — Z87891 Personal history of nicotine dependence: Secondary | ICD-10-CM

## 2022-07-11 DIAGNOSIS — N1831 Chronic kidney disease, stage 3a: Secondary | ICD-10-CM

## 2022-07-11 DIAGNOSIS — Z23 Encounter for immunization: Secondary | ICD-10-CM

## 2022-07-11 DIAGNOSIS — D6869 Other thrombophilia: Secondary | ICD-10-CM

## 2022-07-11 MED ORDER — APIXABAN 5 MG PO TABS
5.0000 mg | ORAL_TABLET | Freq: Two times a day (BID) | ORAL | 1 refills | Status: DC
Start: 2022-07-11 — End: 2023-10-02
  Filled 2022-07-11 – 2023-04-02 (×2): qty 180, 90d supply, fill #0

## 2022-07-11 MED ORDER — CARVEDILOL 12.5 MG PO TABS
12.5000 mg | ORAL_TABLET | Freq: Two times a day (BID) | ORAL | 1 refills | Status: DC
Start: 2022-07-11 — End: 2023-10-02
  Filled 2022-07-11 – 2023-04-02 (×2): qty 180, 90d supply, fill #0

## 2022-07-11 MED ORDER — SIMVASTATIN 20 MG PO TABS
20.0000 mg | ORAL_TABLET | Freq: Every day | ORAL | 1 refills | Status: DC
Start: 2022-07-11 — End: 2023-10-02
  Filled 2022-07-11 – 2023-04-02 (×2): qty 90, 90d supply, fill #0

## 2022-07-11 MED ORDER — LOSARTAN POTASSIUM 25 MG PO TABS
25.0000 mg | ORAL_TABLET | Freq: Every day | ORAL | 1 refills | Status: DC
Start: 2022-07-11 — End: 2023-10-02
  Filled 2022-07-11 – 2022-09-30 (×2): qty 90, 90d supply, fill #0
  Filled 2023-04-02: qty 90, 90d supply, fill #1

## 2022-07-11 MED ORDER — ZOSTER VAC RECOMB ADJUVANTED 50 MCG/0.5ML IM SUSR
0.5000 mL | Freq: Once | INTRAMUSCULAR | 1 refills | Status: AC
Start: 2022-07-11 — End: 2022-07-12
  Filled 2022-07-11 (×2): qty 0.5, 1d supply, fill #0

## 2022-07-11 MED ORDER — DAPAGLIFLOZIN PROPANEDIOL 10 MG PO TABS
10.0000 mg | ORAL_TABLET | Freq: Every day | ORAL | 1 refills | Status: AC
Start: 2022-07-11 — End: ?
  Filled 2022-07-11 – 2022-09-30 (×2): qty 90, 90d supply, fill #0
  Filled 2023-04-02: qty 90, 90d supply, fill #1

## 2022-07-11 NOTE — Progress Notes (Signed)
Patient ID: Jeffrey Martin, male    DOB: Jun 06, 1950  MRN: [id number]  CC: Hypertension (HTN f/u. /No questions / concerns./Yes to shingles vax. Yes to colonoscopy referral)   Subjective: Jeffrey Martin is a 72 y.o. male who presents for chronic ds management His concerns today include:  Patient with history of HTN, HL, A. 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  Has pill box and pill bottles with him today. Medication side effects: []  Yes    [x]  No Adherence with salt restriction: [x]  Yes - does his own cooking Home Monitoring?: []  Yes    [x]  No Monitoring Frequency:  Home BP results range:  SOB? []  Yes    [x]  No Chest Pain?: []  Yes    [x]  No Leg swelling?: []  Yes    [x]  No Headaches?: []  Yes    [x]  No Dizziness? []  Yes    [x]  No Comments:  no bruising or bleeding on Eliquis, no palpitations.   Quit smoking completely 6 mths ago  CHFrEF: Patient is on Farxiga, Lasix, carvedilol and Cozaar.  CKD 3a: Last 2 GFR's were 59 and 52.  Recent creatinine 1.45.  Not on any NSAIDs  Noted to have elevated alkaline phosphatase over the past several years but this has been trending down.  Last alkaline phosphatase level checked in December was 145.  HM: due for shingrix vaccine.  Agreeable to receiving 1st shot today.  Due for colon CA screen.  Referred for c-scope in past.  Has transportation issues and no one to stay with him for colonoscopy.   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 180 tablet 1   carvedilol (COREG) 12.5 MG tablet Take 1 tablet (12.5 mg total) by mouth 2 (two) times daily with a meal. 180 tablet 1   dapagliflozin propanediol (FARXIGA) 10 MG TABS tablet Take 1 tablet (10 mg total) by mouth daily before breakfast. 30 tablet 2   furosemide (LASIX) 20 MG tablet Take 0.5 tablets (10 mg total) by mouth daily. 15 tablet 3   losartan (COZAAR) 25 MG tablet Take 0.5 tablets (12.5 mg total) by mouth daily. 90 tablet 1   Multiple Vitamins-Minerals (MULTIVITAMIN & MINERAL PO) Take 1 tablet by mouth daily.  nicotine polacrilex (COMMIT) 2 MG lozenge Take 1 lozenge (2 mg total) by mouth as needed for smoking cessation. 100 tablet 0   simvastatin (ZOCOR) 20 MG tablet TAKE 1 TABLET (20 MG TOTAL) BY MOUTH DAILY. 90 tablet 0   No current facility-administered medications on file prior to visit.    No Known Allergies  Social History   Socioeconomic History   Marital status: Widowed    Spouse name: Not on file   Number of children: Not on file   Years of education: Not on file   Highest education level: Not on file  Occupational History   Not on file  Tobacco Use   Smoking status: Some Days    Packs/day: .5    Types: Cigarettes    Start  date: 09/04/2014    Last attempt to quit: 09/05/2014    Years since quitting: 7.8   Smokeless tobacco: Former  Substance and Sexual Activity   Alcohol use: Yes    Alcohol/week: 0.0 standard drinks of alcohol    Comment: rare   Drug use: No   Sexual activity: Not on file  Other Topics Concern   Not on file  Social History Narrative   Patient lives with his wife   His son or daughter-in-law will accompany him to appointments    Social Determinants of Health   Financial Resource Strain: Not on file  Food Insecurity: Not on file  Transportation Needs: Not on file  Physical Activity: Not on file  Stress: Not on file  Social Connections: Not on file  Intimate Partner Violence: Not on file    Family History  Problem Relation Age of Onset   Hypertension Mother    Stroke Mother    Stroke Maternal Grandfather    Heart attack Neg Hx     Past Surgical History:  Procedure Laterality Date   CARDIAC CATHETERIZATION N/A 10/10/2014   Procedure: Right/Left Heart Cath and Coronary Angiography;  Surgeon: Tonny Bollman, MD; normal coronary arteries, normal LVEDP, systemic hypertension    REPAIR OF PERFORATED ULCER      ROS: Review of Systems Negative except as stated above  PHYSICAL EXAM: BP 125/86 (BP Location: Left Arm, Patient Position: Sitting, Cuff Size: Normal)   Pulse 84   Ht 5\' 7"  (1.702 m)   Wt 133 lb (60.3 kg)   SpO2 95%   BMI 20.83 kg/m   BP 122/90 Physical Exam   General appearance - alert, well appearing, elderly African-American male and in no distress.  His T-shirt is a little soiled. Mental status -patient answers questions appropriately. Neck - supple, no significant adenopathy Chest - clear to auscultation, no wheezes, rales or rhonchi, symmetric air entry Heart -heart rate is irregularly irregular but rate controlled, normal S1, S2, no murmurs, rubs, clicks or gallops Extremities - peripheral pulses normal, no pedal edema,      Latest Ref Rng & Units  02/04/2022    2:48 PM 11/22/2021    2:28 PM 03/22/2019    4:20 PM  CMP  Glucose 70 - 99 mg/dL 88  83  74   BUN 8 - 27 mg/dL 19  20  13    Creatinine 0.76 - 1.27 mg/dL 1.61  0.96  0.45   Sodium 134 - 144 mmol/L 143  140  143   Potassium 3.5 - 5.2 mmol/L 4.2  4.7  4.8   Chloride 96 - 106 mmol/L 107  102  105   CO2 20 - 29 mmol/L 25  24  27   Calcium 8.6 - 10.2 mg/dL 9.1  9.1  9.5   Total Protein 6.0 - 8.5 g/dL 6.4  6.9  7.2   Total Bilirubin 0.0 - 1.2 mg/dL 0.5  0.3  0.7   Alkaline Phos 44 - 121 IU/L 114  145  140   AST 0 - 40 IU/L 17  19  21    ALT 0 - 44 IU/L 11  9  11     Lipid Panel     Component Value Date/Time   CHOL 131 11/22/2021 1428   TRIG 64 11/22/2021 1428   HDL 56 11/22/2021 1428   CHOLHDL 2.3 11/22/2021 1428   CHOLHDL 2.8 09/07/2014 0525   VLDL 10 09/07/2014 0525   LDLCALC 62 11/22/2021 1428    CBC    Component Value Date/Time   WBC 4.6 03/22/2019 1620   WBC 5.0 09/11/2015 1021   RBC 5.52 03/22/2019 1620   RBC 5.64 09/11/2015 1021   HGB 16.7 03/22/2019 1620   HCT 50.2 03/22/2019 1620   PLT 172 03/22/2019 1620   MCV 91 03/22/2019 1620   MCH 30.3 03/22/2019 1620   MCH 30.0 09/11/2015 1021   MCHC 33.3 03/22/2019 1620   MCHC 33.2 09/11/2015 1021   RDW 13.5 03/22/2019 1620   LYMPHSABS 1.2 06/12/2017 1059   MONOABS 0.7 09/06/2014 2254   EOSABS 0.0 06/12/2017 1059   BASOSABS 0.0 06/12/2017 1059    ASSESSMENT AND PLAN: 1. Essential hypertension Not at goal.  Increase Cozaar to 25 mg daily.  Continue carvedilol 12.25 mg twice a day - CBC  2. Chronic systolic CHF (congestive heart failure), NYHA class 2 (HCC) Compensated.  It has been about 4 years since he had an echo.  He is agreeable to getting an updated study. Continue carvedilol, Farxiga, Cozaar and low-dose furosemide.  May consider changing furosemide to as needed if EF has normalize on echo - ECHOCARDIOGRAM COMPLETE; Future  3. Stage 3a chronic kidney disease (HCC) - Basic metabolic panel -  Microalbumin / creatinine urine ratio  4. Paroxysmal atrial fibrillation (HCC) Stable and rate control.  Continue Eliquis and carvedilol  5. Acquired thrombophilia (HCC) Due to atrial fibrillation  6. Hyperlipidemia, unspecified hyperlipidemia type Continue simvastatin  7. Former smoker Commended him on quitting.  Encouraged him to remain tobacco free  8. Screening for colon cancer -Seems like transportation issue and not having anyone to stay with him will pose a problem with him getting colonoscopy.  I discussed Cologuard test with him but patient again states that he would not be able to get it to [employer].  He is agreeable to doing fit test. - Fecal occult blood, imunochemical([facility])  9. Need for shingles vaccine Printed prescription given for him to take to our pharmacy today - Zoster Vaccine Adjuvanted [facility]) injection; Inject 0.5 mLs into the muscle once for 1 dose.  Dispense: 0.5 mL; Refill: 1  10. Prostate cancer screening Patient agreeable to screening. - PSA     Patient was given the opportunity to ask questions.  Patient verbalized understanding of the plan and was able to repeat key elements of the plan.   This documentation was completed using Paediatric nurse.  Any transcriptional errors are unintentional.  No orders of the defined types were placed in this encounter.    Requested Prescriptions    No prescriptions requested or ordered in this encounter    No follow-ups on file.  Jonah Blue, MD, FACP

## 2022-07-11 NOTE — Patient Instructions (Signed)
Increase Losartan to 25 mg 1 full tablet daily.

## 2022-07-12 ENCOUNTER — Telehealth: Payer: Self-pay | Admitting: Internal Medicine

## 2022-07-12 LAB — CBC
Hematocrit: 47.1 % (ref 37.5–51.0)
Hemoglobin: 15.7 g/dL (ref 13.0–17.7)
MCH: 31.2 pg (ref 26.6–33.0)
MCHC: 33.3 g/dL (ref 31.5–35.7)
MCV: 94 fL (ref 79–97)
Platelets: 151 10*3/uL (ref 150–450)
RBC: 5.04 x10E6/uL (ref 4.14–5.80)
RDW: 13.1 % (ref 11.6–15.4)
WBC: 4.2 10*3/uL (ref 3.4–10.8)

## 2022-07-12 LAB — BASIC METABOLIC PANEL
BUN/Creatinine Ratio: 13 (ref 10–24)
BUN: 16 mg/dL (ref 8–27)
CO2: 23 mmol/L (ref 20–29)
Calcium: 9.6 mg/dL (ref 8.6–10.2)
Chloride: 104 mmol/L (ref 96–106)
Creatinine, Ser: 1.23 mg/dL (ref 0.76–1.27)
Glucose: 89 mg/dL (ref 70–99)
Potassium: 4 mmol/L (ref 3.5–5.2)
Sodium: 145 mmol/L — ABNORMAL HIGH (ref 134–144)
eGFR: 64 mL/min/{1.73_m2} (ref >59)

## 2022-07-12 LAB — PSA: Prostate Specific Ag, Serum: 3.1 ng/mL (ref 0.0–4.0)

## 2022-07-12 NOTE — Telephone Encounter (Signed)
Dawn calling from [facility] is calling to report that the patient is scheduled 07/16/22 for an echocardiogram ordered by Dr. Laural Benes- needing preauth from insurance. CB- [phone]

## 2022-07-12 NOTE — Telephone Encounter (Signed)
PA # for Echocardiogram ordered by Dr. Laural Benes- [id number]. Information relay to [facility] at [facility] Per service Center

## 2022-07-16 ENCOUNTER — Ambulatory Visit (HOSPITAL_COMMUNITY): Admission: RE | Admit: 2022-07-16 | Payer: Medicare HMO | Source: Ambulatory Visit

## 2022-08-01 ENCOUNTER — Telehealth: Payer: Self-pay | Admitting: Internal Medicine

## 2022-08-07 NOTE — Telephone Encounter (Signed)
Scheduled an echocardiogram for 08/19/2022 at 3:00 p.m. No prior prep or restrictions are necessary. Patient will need to arrive 15 minutes early at entrance "C" at [facility].  Called but unable to LVM for the patient.

## 2022-08-08 NOTE — Telephone Encounter (Signed)
Called but no answer and unable to LVM for the pt.

## 2022-08-09 NOTE — Telephone Encounter (Signed)
FYI Dr.Johnson. Called main number but no answer. Unable to LVM. Called secondary number but no answer. LVM to call back.

## 2022-08-13 NOTE — Telephone Encounter (Signed)
Reminder letter sent to patient via mail on 08/13/2022.

## 2022-08-19 ENCOUNTER — Ambulatory Visit (HOSPITAL_COMMUNITY): Payer: Medicare HMO

## 2022-08-30 ENCOUNTER — Telehealth: Payer: Self-pay | Admitting: Internal Medicine

## 2022-08-30 NOTE — Telephone Encounter (Signed)
Copied from CRM [id number]. Topic: General - Other >> Aug 30, 2022 12:27 PM Ja-Kwan M wrote: Reason for CRM: Dawn with [facility] reports that the CPT code for the echocardiogram is incorrect and pt insurance will not cover if this is not corrected. Cb# [phone] Ext. [phone]

## 2022-09-02 ENCOUNTER — Ambulatory Visit (HOSPITAL_COMMUNITY): Admission: RE | Admit: 2022-09-02 | Payer: Medicare HMO | Source: Ambulatory Visit

## 2022-09-02 NOTE — Telephone Encounter (Signed)
Call placed to [facility] patient care center for follow-up. Message left on VM.

## 2022-09-02 NOTE — Telephone Encounter (Signed)
Spoke with Dawn at [facility] care center. CPT code that was approved was for 35009 the CPT needs for be 93306.

## 2022-09-02 NOTE — Telephone Encounter (Signed)
Call placed to [facility] revised to change CPT code to 93307. New Authorization # is [id number]. Called placed to [facility] with [facility] patient care service to give her updated Authorization # . Unable to reach message left with New Authorization # .

## 2022-09-18 ENCOUNTER — Telehealth: Payer: Self-pay | Admitting: Internal Medicine

## 2022-09-18 NOTE — Telephone Encounter (Signed)
Pt was approved for Echo, service code 16109 from July 15-Aug15, 2024.

## 2022-09-30 ENCOUNTER — Other Ambulatory Visit: Payer: Self-pay | Admitting: Physician Assistant

## 2022-09-30 ENCOUNTER — Other Ambulatory Visit: Payer: Self-pay

## 2022-09-30 DIAGNOSIS — I5022 Chronic systolic (congestive) heart failure: Secondary | ICD-10-CM

## 2022-10-01 ENCOUNTER — Other Ambulatory Visit: Payer: Self-pay

## 2022-10-01 MED ORDER — FUROSEMIDE 20 MG PO TABS
10.0000 mg | ORAL_TABLET | Freq: Every day | ORAL | 1 refills | Status: DC
Start: 2022-10-01 — End: 2023-10-02
  Filled 2022-10-01 – 2023-04-02 (×2): qty 15, 30d supply, fill #0
  Filled 2023-08-25: qty 15, 30d supply, fill #1

## 2022-10-11 ENCOUNTER — Other Ambulatory Visit: Payer: Self-pay

## 2022-11-18 ENCOUNTER — Ambulatory Visit: Payer: Medicare HMO | Admitting: Internal Medicine

## 2023-04-02 ENCOUNTER — Other Ambulatory Visit: Payer: Self-pay

## 2023-04-17 ENCOUNTER — Telehealth: Payer: Self-pay

## 2023-04-17 NOTE — Telephone Encounter (Signed)
 Call to patient to inquire where he received QuantaFlo test at and to sign release of information form so that the results can be sent to Dr. Laural Benes. Unable to reach patient VM left.

## 2023-04-17 NOTE — Telephone Encounter (Signed)
 Copied from CRM [id number]. Topic: Clinical - Home Health Verbal Orders >> Apr 15, 2023  1:19 PM Maxwell Marion wrote: Sedalia Muta, NP with [employer], wants to leave a message for Dr. Laural Benes.  "Patient had a quantaflow evaluation today that shows severe left PAD, her recommendation is a vascular consult." Call back number for any questions or concerns in [phone].

## 2023-04-17 NOTE — Telephone Encounter (Signed)
 If pt calls back, please get him in with me for a visit to be assessed for peripheral artery disease. Last seen 06/2022.

## 2023-04-18 NOTE — Telephone Encounter (Signed)
 Called but no answer. LVM to call back and scheduled an appointment.

## 2023-08-25 ENCOUNTER — Other Ambulatory Visit: Payer: Self-pay | Admitting: Internal Medicine

## 2023-08-25 ENCOUNTER — Other Ambulatory Visit: Payer: Self-pay

## 2023-08-25 DIAGNOSIS — E785 Hyperlipidemia, unspecified: Secondary | ICD-10-CM

## 2023-08-25 DIAGNOSIS — I10 Essential (primary) hypertension: Secondary | ICD-10-CM

## 2023-08-25 DIAGNOSIS — I480 Paroxysmal atrial fibrillation: Secondary | ICD-10-CM

## 2023-09-01 ENCOUNTER — Other Ambulatory Visit: Payer: Self-pay

## 2023-09-03 ENCOUNTER — Other Ambulatory Visit: Payer: Self-pay

## 2023-10-02 ENCOUNTER — Other Ambulatory Visit: Payer: Self-pay | Admitting: Internal Medicine

## 2023-10-02 ENCOUNTER — Other Ambulatory Visit: Payer: Self-pay

## 2023-10-02 DIAGNOSIS — E785 Hyperlipidemia, unspecified: Secondary | ICD-10-CM

## 2023-10-02 DIAGNOSIS — I5022 Chronic systolic (congestive) heart failure: Secondary | ICD-10-CM

## 2023-10-02 DIAGNOSIS — I480 Paroxysmal atrial fibrillation: Secondary | ICD-10-CM

## 2023-10-02 DIAGNOSIS — I10 Essential (primary) hypertension: Secondary | ICD-10-CM

## 2023-10-04 ENCOUNTER — Other Ambulatory Visit: Payer: Self-pay

## 2023-10-04 MED ORDER — SIMVASTATIN 20 MG PO TABS
20.0000 mg | ORAL_TABLET | Freq: Every day | ORAL | 0 refills | Status: AC
  Filled 2023-10-04: qty 20, 20d supply, fill #0

## 2023-10-04 MED ORDER — CARVEDILOL 12.5 MG PO TABS
12.5000 mg | ORAL_TABLET | Freq: Two times a day (BID) | ORAL | 0 refills | Status: DC
  Filled 2023-10-04: qty 40, 20d supply, fill #0

## 2023-10-04 MED ORDER — APIXABAN 5 MG PO TABS
5.0000 mg | ORAL_TABLET | Freq: Two times a day (BID) | ORAL | 0 refills | Status: DC
  Filled 2023-10-04: qty 40, 20d supply, fill #0

## 2023-10-04 MED ORDER — FUROSEMIDE 20 MG PO TABS
10.0000 mg | ORAL_TABLET | Freq: Every day | ORAL | 0 refills | Status: DC
  Filled 2023-10-04: qty 10, 20d supply, fill #0

## 2023-10-04 MED ORDER — LOSARTAN POTASSIUM 25 MG PO TABS
25.0000 mg | ORAL_TABLET | Freq: Every day | ORAL | 0 refills | Status: DC
  Filled 2023-10-04: qty 20, 20d supply, fill #0

## 2023-10-07 ENCOUNTER — Other Ambulatory Visit: Payer: Self-pay

## 2023-10-22 ENCOUNTER — Telehealth: Payer: Self-pay | Admitting: Internal Medicine

## 2023-10-22 NOTE — Telephone Encounter (Signed)
 Patient confirmed appointment for 10/23/2023, through volunteer call.

## 2023-10-23 ENCOUNTER — Ambulatory Visit: Admitting: Internal Medicine

## 2023-12-31 ENCOUNTER — Encounter (HOSPITAL_COMMUNITY): Payer: Self-pay | Admitting: Emergency Medicine

## 2023-12-31 ENCOUNTER — Inpatient Hospital Stay (HOSPITAL_COMMUNITY)
Admission: EM | Admit: 2023-12-31 | Discharge: 2024-01-06 | DRG: 698 | Disposition: A | Discharge status: Home / Self Care | Attending: General Surgery | Admitting: General Surgery

## 2023-12-31 ENCOUNTER — Inpatient Hospital Stay (HOSPITAL_COMMUNITY)

## 2023-12-31 ENCOUNTER — Emergency Department (HOSPITAL_COMMUNITY)

## 2023-12-31 DIAGNOSIS — Z823 Family history of stroke: Secondary | ICD-10-CM

## 2023-12-31 DIAGNOSIS — Z7901 Long term (current) use of anticoagulants: Secondary | ICD-10-CM | POA: Diagnosis not present

## 2023-12-31 DIAGNOSIS — S3991XA Unspecified injury of abdomen, initial encounter: Secondary | ICD-10-CM

## 2023-12-31 DIAGNOSIS — K8070 Calculus of gallbladder and bile duct without cholecystitis without obstruction: Secondary | ICD-10-CM | POA: Diagnosis present

## 2023-12-31 DIAGNOSIS — Y92481 Parking lot as the place of occurrence of the external cause: Secondary | ICD-10-CM

## 2023-12-31 DIAGNOSIS — E785 Hyperlipidemia, unspecified: Secondary | ICD-10-CM | POA: Diagnosis present

## 2023-12-31 DIAGNOSIS — Z8711 Personal history of peptic ulcer disease: Secondary | ICD-10-CM

## 2023-12-31 DIAGNOSIS — S2242XA Multiple fractures of ribs, left side, initial encounter for closed fracture: Secondary | ICD-10-CM | POA: Diagnosis present

## 2023-12-31 DIAGNOSIS — N280 Ischemia and infarction of kidney: Secondary | ICD-10-CM | POA: Diagnosis present

## 2023-12-31 DIAGNOSIS — S2249XA Multiple fractures of ribs, unspecified side, initial encounter for closed fracture: Principal | ICD-10-CM

## 2023-12-31 DIAGNOSIS — Z8673 Personal history of transient ischemic attack (TIA), and cerebral infarction without residual deficits: Secondary | ICD-10-CM

## 2023-12-31 DIAGNOSIS — S36113A Laceration of liver, unspecified degree, initial encounter: Secondary | ICD-10-CM

## 2023-12-31 DIAGNOSIS — F17210 Nicotine dependence, cigarettes, uncomplicated: Secondary | ICD-10-CM | POA: Diagnosis present

## 2023-12-31 DIAGNOSIS — S3692XA Contusion of unspecified intra-abdominal organ, initial encounter: Secondary | ICD-10-CM | POA: Diagnosis present

## 2023-12-31 DIAGNOSIS — N179 Acute kidney failure, unspecified: Secondary | ICD-10-CM | POA: Diagnosis present

## 2023-12-31 DIAGNOSIS — Z8249 Family history of ischemic heart disease and other diseases of the circulatory system: Secondary | ICD-10-CM

## 2023-12-31 DIAGNOSIS — I110 Hypertensive heart disease with heart failure: Secondary | ICD-10-CM | POA: Diagnosis present

## 2023-12-31 DIAGNOSIS — K8050 Calculus of bile duct without cholangitis or cholecystitis without obstruction: Secondary | ICD-10-CM | POA: Diagnosis not present

## 2023-12-31 DIAGNOSIS — Z800 Family history of malignant neoplasm of digestive organs: Secondary | ICD-10-CM

## 2023-12-31 DIAGNOSIS — Z79899 Other long term (current) drug therapy: Secondary | ICD-10-CM

## 2023-12-31 DIAGNOSIS — I5022 Chronic systolic (congestive) heart failure: Secondary | ICD-10-CM | POA: Diagnosis present

## 2023-12-31 DIAGNOSIS — S36115A Moderate laceration of liver, initial encounter: Secondary | ICD-10-CM | POA: Diagnosis present

## 2023-12-31 DIAGNOSIS — I4891 Unspecified atrial fibrillation: Secondary | ICD-10-CM | POA: Diagnosis present

## 2023-12-31 DIAGNOSIS — R58 Hemorrhage, not elsewhere classified: Secondary | ICD-10-CM | POA: Diagnosis present

## 2023-12-31 DIAGNOSIS — I428 Other cardiomyopathies: Secondary | ICD-10-CM | POA: Diagnosis present

## 2023-12-31 DIAGNOSIS — E875 Hyperkalemia: Secondary | ICD-10-CM | POA: Diagnosis present

## 2023-12-31 DIAGNOSIS — R7401 Elevation of levels of liver transaminase levels: Secondary | ICD-10-CM | POA: Diagnosis present

## 2023-12-31 LAB — I-STAT CHEM 8, ED
BUN: 22 mg/dL (ref 8–23)
Calcium, Ion: 0.94 mmol/L — ABNORMAL LOW (ref 1.15–1.40)
Chloride: 108 mmol/L (ref 98–111)
Creatinine, Ser: 1.5 mg/dL — ABNORMAL HIGH (ref 0.61–1.24)
Glucose, Bld: 131 mg/dL — ABNORMAL HIGH (ref 70–99)
HCT: 52 % (ref 39.0–52.0)
Hemoglobin: 17.7 g/dL — ABNORMAL HIGH (ref 13.0–17.0)
Potassium: 8.4 mmol/L (ref 3.5–5.1)
Sodium: 136 mmol/L (ref 135–145)
TCO2: 26 mmol/L (ref 22–32)

## 2023-12-31 LAB — CBC WITH DIFFERENTIAL/PLATELET
Abs Immature Granulocytes: 0.03 K/uL (ref 0.00–0.07)
Abs Immature Granulocytes: 0.08 K/uL — ABNORMAL HIGH (ref 0.00–0.07)
Basophils Absolute: 0 K/uL (ref 0.0–0.1)
Basophils Absolute: 0 K/uL (ref 0.0–0.1)
Basophils Relative: 1 %
Basophils Relative: 0 %
Eosinophils Absolute: 0 K/uL (ref 0.0–0.5)
Eosinophils Absolute: 0 K/uL (ref 0.0–0.5)
Eosinophils Relative: 1 %
Eosinophils Relative: 0 %
HCT: 50.4 % (ref 39.0–52.0)
HCT: 44.2 % (ref 39.0–52.0)
Hemoglobin: 15.8 g/dL (ref 13.0–17.0)
Hemoglobin: 14 g/dL (ref 13.0–17.0)
Immature Granulocytes: 1 %
Immature Granulocytes: 1 %
Lymphocytes Relative: 20 %
Lymphocytes Relative: 5 %
Lymphs Abs: 1.3 K/uL (ref 0.7–4.0)
Lymphs Abs: 0.7 K/uL (ref 0.7–4.0)
MCH: 29.9 pg (ref 26.0–34.0)
MCH: 29.9 pg (ref 26.0–34.0)
MCHC: 31.3 g/dL (ref 30.0–36.0)
MCHC: 31.7 g/dL (ref 30.0–36.0)
MCV: 95.3 fL (ref 80.0–100.0)
MCV: 94.4 fL (ref 80.0–100.0)
Monocytes Absolute: 0.3 K/uL (ref 0.1–1.0)
Monocytes Absolute: 0.9 K/uL (ref 0.1–1.0)
Monocytes Relative: 4 %
Monocytes Relative: 6 %
Neutro Abs: 4.8 K/uL (ref 1.7–7.7)
Neutro Abs: 13 K/uL — ABNORMAL HIGH (ref 1.7–7.7)
Neutrophils Relative %: 73 %
Neutrophils Relative %: 88 %
Platelets: 147 K/uL — ABNORMAL LOW (ref 150–400)
Platelets: UNDETERMINED K/uL (ref 150–400)
RBC: 5.29 MIL/uL (ref 4.22–5.81)
RBC: 4.68 MIL/uL (ref 4.22–5.81)
RDW: 14.4 % (ref 11.5–15.5)
RDW: 14.5 % (ref 11.5–15.5)
WBC: 6.4 K/uL (ref 4.0–10.5)
WBC: 14.7 K/uL — ABNORMAL HIGH (ref 4.0–10.5)
nRBC: 0 % (ref 0.0–0.2)
nRBC: 0 % (ref 0.0–0.2)

## 2023-12-31 LAB — MRSA NEXT GEN BY PCR, NASAL: MRSA by PCR Next Gen: NOT DETECTED

## 2023-12-31 LAB — BASIC METABOLIC PANEL WITH GFR
Anion gap: 12 (ref 5–15)
BUN: 14 mg/dL (ref 8–23)
CO2: 22 mmol/L (ref 22–32)
Calcium: 9.1 mg/dL (ref 8.9–10.3)
Chloride: 107 mmol/L (ref 98–111)
Creatinine, Ser: 1.34 mg/dL — ABNORMAL HIGH (ref 0.61–1.24)
GFR, Estimated: 57 mL/min — ABNORMAL LOW (ref >60)
Glucose, Bld: 124 mg/dL — ABNORMAL HIGH (ref 70–99)
Potassium: 4 mmol/L (ref 3.5–5.1)
Sodium: 140 mmol/L (ref 135–145)

## 2023-12-31 LAB — TYPE AND SCREEN
ABO/RH(D): O POS
Antibody Screen: NEGATIVE

## 2023-12-31 MED ORDER — ONDANSETRON HCL 4 MG/2ML IJ SOLN
4.0000 mg | Freq: Once | INTRAMUSCULAR | Status: AC
  Administered 2023-12-31: 4 mg via INTRAVENOUS
  Filled 2023-12-31: qty 2

## 2023-12-31 MED ORDER — METHOCARBAMOL 500 MG PO TABS
1000.0000 mg | ORAL_TABLET | Freq: Three times a day (TID) | ORAL | Status: DC
  Administered 2023-12-31 – 2024-01-04 (×13): 1000 mg via ORAL
  Filled 2023-12-31 (×14): qty 2

## 2023-12-31 MED ORDER — ONDANSETRON HCL 4 MG/2ML IJ SOLN
4.0000 mg | Freq: Four times a day (QID) | INTRAMUSCULAR | Status: DC | PRN
Start: 2023-12-31 — End: 2024-01-06

## 2023-12-31 MED ORDER — ACETAMINOPHEN 500 MG PO TABS
1000.0000 mg | ORAL_TABLET | Freq: Four times a day (QID) | ORAL | Status: DC
  Administered 2023-12-31 – 2024-01-06 (×10): 1000 mg via ORAL
  Filled 2023-12-31 (×14): qty 2

## 2023-12-31 MED ORDER — DOCUSATE SODIUM 100 MG PO CAPS
100.0000 mg | ORAL_CAPSULE | Freq: Two times a day (BID) | ORAL | Status: DC
  Administered 2023-12-31 – 2024-01-06 (×11): 100 mg via ORAL
  Filled 2023-12-31 (×12): qty 1

## 2023-12-31 MED ORDER — MORPHINE SULFATE (PF) 4 MG/ML IV SOLN
4.0000 mg | Freq: Once | INTRAVENOUS | Status: AC
  Administered 2023-12-31: 4 mg via INTRAVENOUS
  Filled 2023-12-31: qty 1

## 2023-12-31 MED ORDER — IOHEXOL 300 MG/ML  SOLN
100.0000 mL | Freq: Once | INTRAMUSCULAR | Status: AC | PRN
  Administered 2023-12-31: 100 mL via INTRAVENOUS

## 2023-12-31 MED ORDER — PROTHROMBIN COMPLEX CONC HUMAN 500 UNITS IV KIT
3445.0000 [IU] | PACK | Status: AC
  Administered 2023-12-31: 3445 [IU] via INTRAVENOUS
  Filled 2023-12-31: qty 3445

## 2023-12-31 MED ORDER — AMLODIPINE BESYLATE 5 MG PO TABS
5.0000 mg | ORAL_TABLET | Freq: Once | ORAL | Status: AC
  Administered 2023-12-31: 5 mg via ORAL
  Filled 2023-12-31: qty 1

## 2023-12-31 MED ORDER — LACTATED RINGERS IV SOLN: INTRAVENOUS | Status: DC

## 2023-12-31 MED ORDER — POLYETHYLENE GLYCOL 3350 17 G PO PACK
17.0000 g | PACK | Freq: Every day | ORAL | Status: DC | PRN
Start: 2023-12-31 — End: 2024-01-03

## 2023-12-31 MED ORDER — HYDRALAZINE HCL 20 MG/ML IJ SOLN
10.0000 mg | INTRAMUSCULAR | Status: DC | PRN
  Administered 2024-01-01: 10 mg via INTRAVENOUS
  Filled 2023-12-31: qty 1

## 2023-12-31 MED ORDER — OXYCODONE HCL 5 MG PO TABS
5.0000 mg | ORAL_TABLET | ORAL | Status: DC | PRN
  Administered 2023-12-31 – 2024-01-02 (×4): 10 mg via ORAL
  Administered 2024-01-03: 5 mg via ORAL
  Administered 2024-01-04 – 2024-01-05 (×5): 10 mg via ORAL
  Administered 2024-01-05 – 2024-01-06 (×2): 5 mg via ORAL
  Filled 2023-12-31 (×2): qty 2
  Filled 2023-12-31: qty 1
  Filled 2023-12-31 (×4): qty 2
  Filled 2023-12-31 (×2): qty 1
  Filled 2023-12-31 (×3): qty 2

## 2023-12-31 MED ORDER — METOPROLOL TARTRATE 5 MG/5ML IV SOLN
5.0000 mg | Freq: Four times a day (QID) | INTRAVENOUS | Status: AC | PRN
  Administered 2023-12-31 – 2024-01-04 (×4): 5 mg via INTRAVENOUS
  Filled 2023-12-31 (×4): qty 5

## 2023-12-31 MED ORDER — ONDANSETRON 4 MG PO TBDP: 4.0000 mg | ORAL_TABLET | Freq: Four times a day (QID) | ORAL | Status: DC | PRN

## 2023-12-31 MED ORDER — MORPHINE SULFATE (PF) 4 MG/ML IV SOLN: 4.0000 mg | INTRAVENOUS | Status: DC | PRN

## 2023-12-31 MED ORDER — MORPHINE SULFATE (PF) 2 MG/ML IV SOLN: 4.0000 mg | INTRAVENOUS | Status: DC | PRN

## 2023-12-31 NOTE — Progress Notes (Addendum)
 Case d/w IR, Dr. Hughes. Despite active bleed, low likelihood of being able to localize bleed. Patient is hemodynamically stable, in fact hypertensive. If hypotensive, will discuss again with IR. Would recommend BP control with goal systolic 100-140. Pending transfer to [facility]. Completion trauma scans ordered, to be performed at [facility] after arrival. Will assess on arrival and place admission orders after eval.   Dreama GEANNIE Hanger, MD General and Trauma Surgery [facility]

## 2023-12-31 NOTE — ED Notes (Addendum)
 [facility] called for ED to ED transfer. Accepting Dr. Francesca.

## 2023-12-31 NOTE — ED Notes (Signed)
 Called report to Ozell, CHARITY FUNDRAISER at [facility] ED.
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
## 2023-12-31 NOTE — ED Notes (Signed)
 Pt arrived. Vitals stable during transport.
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
## 2024-01-01 LAB — HEPATIC FUNCTION PANEL
ALT: 109 U/L — ABNORMAL HIGH (ref 0–44)
AST: 134 U/L — ABNORMAL HIGH (ref 15–41)
Albumin: 2.9 g/dL — ABNORMAL LOW (ref 3.5–5.0)
Alkaline Phosphatase: 97 U/L (ref 38–126)
Bilirubin, Direct: 0.3 mg/dL — ABNORMAL HIGH (ref 0.0–0.2)
Indirect Bilirubin: 0.8 mg/dL (ref 0.3–0.9)
Total Bilirubin: 1.1 mg/dL (ref 0.0–1.2)
Total Protein: 6.2 g/dL — ABNORMAL LOW (ref 6.5–8.1)

## 2024-01-01 LAB — CBC
HCT: 38.5 % — ABNORMAL LOW (ref 39.0–52.0)
HCT: 32.7 % — ABNORMAL LOW (ref 39.0–52.0)
Hemoglobin: 12.4 g/dL — ABNORMAL LOW (ref 13.0–17.0)
Hemoglobin: 10.8 g/dL — ABNORMAL LOW (ref 13.0–17.0)
MCH: 30.8 pg (ref 26.0–34.0)
MCH: 30.5 pg (ref 26.0–34.0)
MCHC: 32.2 g/dL (ref 30.0–36.0)
MCHC: 33 g/dL (ref 30.0–36.0)
MCV: 95.5 fL (ref 80.0–100.0)
MCV: 92.4 fL (ref 80.0–100.0)
Platelets: 87 K/uL — ABNORMAL LOW (ref 150–400)
Platelets: 105 K/uL — ABNORMAL LOW (ref 150–400)
RBC: 4.03 MIL/uL — ABNORMAL LOW (ref 4.22–5.81)
RBC: 3.54 MIL/uL — ABNORMAL LOW (ref 4.22–5.81)
RDW: 14.5 % (ref 11.5–15.5)
RDW: 14.4 % (ref 11.5–15.5)
WBC: 11.4 K/uL — ABNORMAL HIGH (ref 4.0–10.5)
WBC: 8.5 K/uL (ref 4.0–10.5)
nRBC: 0 % (ref 0.0–0.2)
nRBC: 0 % (ref 0.0–0.2)

## 2024-01-01 LAB — BASIC METABOLIC PANEL WITH GFR
Anion gap: 14 (ref 5–15)
BUN: 19 mg/dL (ref 8–23)
CO2: 17 mmol/L — ABNORMAL LOW (ref 22–32)
Calcium: 8.2 mg/dL — ABNORMAL LOW (ref 8.9–10.3)
Chloride: 107 mmol/L (ref 98–111)
Creatinine, Ser: 1.59 mg/dL — ABNORMAL HIGH (ref 0.61–1.24)
GFR, Estimated: 46 mL/min — ABNORMAL LOW (ref >60)
Glucose, Bld: 112 mg/dL — ABNORMAL HIGH (ref 70–99)
Potassium: 5.5 mmol/L — ABNORMAL HIGH (ref 3.5–5.1)
Sodium: 138 mmol/L (ref 135–145)

## 2024-01-01 MED ORDER — ORAL CARE MOUTH RINSE: 15.0000 mL | OROMUCOSAL | Status: DC | PRN

## 2024-01-01 MED ORDER — HYDRALAZINE HCL 20 MG/ML IJ SOLN
10.0000 mg | INTRAMUSCULAR | Status: AC | PRN
  Administered 2024-01-01 – 2024-01-02 (×3): 10 mg via INTRAVENOUS
  Filled 2024-01-01 (×2): qty 1

## 2024-01-01 MED ORDER — METOPROLOL TARTRATE 25 MG PO TABS
25.0000 mg | ORAL_TABLET | Freq: Two times a day (BID) | ORAL | Status: DC
  Administered 2024-01-01 – 2024-01-03 (×4): 25 mg via ORAL
  Filled 2024-01-01 (×4): qty 1

## 2024-01-01 MED ORDER — SODIUM CHLORIDE 0.9 % IV SOLN: INTRAVENOUS | Status: DC

## 2024-01-01 MED ORDER — SODIUM CHLORIDE 0.9 % IV BOLUS
1000.0000 mL | Freq: Once | INTRAVENOUS | Status: AC
  Administered 2024-01-01: 1000 mL via INTRAVENOUS

## 2024-01-01 MED ORDER — CHLORHEXIDINE GLUCONATE CLOTH 2 % EX PADS
6.0000 | MEDICATED_PAD | Freq: Every day | CUTANEOUS | Status: DC
  Administered 2024-01-01 – 2024-01-04 (×4): 6 via TOPICAL

## 2024-01-01 MED ORDER — SODIUM ZIRCONIUM CYCLOSILICATE 10 G PO PACK
10.0000 g | PACK | Freq: Once | ORAL | Status: AC
  Administered 2024-01-01: 10 g via ORAL
  Filled 2024-01-01: qty 1
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
## 2024-01-01 NOTE — TOC CAGE-AID Note (Signed)
 Transition of Care [facility]) - CAGE-AID Screening   Patient Details  Name: Jeffrey Martin MRN: [id number] Date of Birth: [birth year]  [facility] CM/SW Contact:    Thomson Herbers E Kellen Dutch, LCSW Phone Number: 01/01/2024, 2:39 PM   Clinical Narrative: Patient states his alcohol use is not as bad as in the past. Patient states he currently drinks about a six pack every 2 weeks. Patient denies other substance use or resource needs.   CAGE-AID Screening:    Have You Ever Felt You Ought to Cut Down on Your Drinking or Drug Use?: Yes Have People Annoyed You By Critizing Your Drinking Or Drug Use?: No Have You Felt Bad Or Guilty About Your Drinking Or Drug Use?: No Have You Ever Had a Drink or Used Drugs First Thing In The Morning to Steady Your Nerves or to Get Rid of a Hangover?: No CAGE-AID Score: 1  Substance Abuse Education Offered: Yes
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
## 2024-01-02 ENCOUNTER — Inpatient Hospital Stay (HOSPITAL_COMMUNITY)

## 2024-01-02 ENCOUNTER — Other Ambulatory Visit: Payer: Self-pay

## 2024-01-02 DIAGNOSIS — K8050 Calculus of bile duct without cholangitis or cholecystitis without obstruction: Secondary | ICD-10-CM | POA: Diagnosis not present

## 2024-01-02 DIAGNOSIS — S36113A Laceration of liver, unspecified degree, initial encounter: Secondary | ICD-10-CM

## 2024-01-02 LAB — CBC
HCT: 32.8 % — ABNORMAL LOW (ref 39.0–52.0)
Hemoglobin: 10.8 g/dL — ABNORMAL LOW (ref 13.0–17.0)
MCH: 30.7 pg (ref 26.0–34.0)
MCHC: 32.9 g/dL (ref 30.0–36.0)
MCV: 93.2 fL (ref 80.0–100.0)
Platelets: 106 K/uL — ABNORMAL LOW (ref 150–400)
RBC: 3.52 MIL/uL — ABNORMAL LOW (ref 4.22–5.81)
RDW: 14.6 % (ref 11.5–15.5)
WBC: 8.2 K/uL (ref 4.0–10.5)
nRBC: 0 % (ref 0.0–0.2)

## 2024-01-02 LAB — HEPATIC FUNCTION PANEL
ALT: 74 U/L — ABNORMAL HIGH (ref 0–44)
AST: 62 U/L — ABNORMAL HIGH (ref 15–41)
Albumin: 3 g/dL — ABNORMAL LOW (ref 3.5–5.0)
Alkaline Phosphatase: 94 U/L (ref 38–126)
Bilirubin, Direct: 0.3 mg/dL — ABNORMAL HIGH (ref 0.0–0.2)
Indirect Bilirubin: 0.8 mg/dL (ref 0.3–0.9)
Total Bilirubin: 1.1 mg/dL (ref 0.0–1.2)
Total Protein: 6.1 g/dL — ABNORMAL LOW (ref 6.5–8.1)

## 2024-01-02 LAB — BASIC METABOLIC PANEL WITH GFR
Anion gap: 8 (ref 5–15)
BUN: 14 mg/dL (ref 8–23)
CO2: 23 mmol/L (ref 22–32)
Calcium: 8.3 mg/dL — ABNORMAL LOW (ref 8.9–10.3)
Chloride: 104 mmol/L (ref 98–111)
Creatinine, Ser: 1.09 mg/dL (ref 0.61–1.24)
GFR, Estimated: >60 mL/min (ref >60)
Glucose, Bld: 105 mg/dL — ABNORMAL HIGH (ref 70–99)
Potassium: 4 mmol/L (ref 3.5–5.1)
Sodium: 135 mmol/L (ref 135–145)

## 2024-01-02 MED ORDER — LOSARTAN POTASSIUM 25 MG PO TABS
25.0000 mg | ORAL_TABLET | Freq: Every day | ORAL | Status: DC
  Administered 2024-01-02 – 2024-01-06 (×5): 25 mg via ORAL
  Filled 2024-01-02 (×5): qty 1
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
## 2024-01-02 NOTE — Plan of Care (Signed)
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
## 2024-01-02 NOTE — Progress Notes (Addendum)
 Patient ID: Jeffrey Martin, male   DOB: 08/31/1950, 73 y.o.   MRN: [id number] Follow up - Trauma Critical Care   Patient Details:    Jeffrey Martin is an 73 y.o. male.  Lines/tubes :   Microbiology/Sepsis markers: Results for orders placed or performed during the hospital encounter of 12/31/23  MRSA Next Gen by PCR, Nasal     Status: None   Collection Time: 12/31/23  9:55 PM   Specimen: Nasal Mucosa; Nasal Swab  Result Value Ref Range Status   MRSA by PCR Next Gen NOT DETECTED NOT DETECTED Final    Comment: (NOTE) The GeneXpert MRSA Assay (FDA approved for NASAL specimens only), is one component of a comprehensive MRSA colonization surveillance program. It is not intended to diagnose MRSA infection nor to guide or monitor treatment for MRSA infections. Test performance is not FDA approved in patients less than 73 years old. Performed at [facility], [address] [place], [place] [address]     Anti-infectives:  Anti-infectives (From admission, onward)    None       Consults: Treatment Team:  Md, Trauma, MD    Studies:    Events:  Subjective:    Overnight Issues: no abdominal pain, tol CL SBP remains elevated  Objective:  Vital signs for last 24 hours: Temp:  [97.4 F (36.3 C)-98.6 F (37 C)] 98.2 F (36.8 C) (11/14 0743) Pulse Rate:  [72-171] 103 (11/14 0500) Resp:  [7-39] 32 (11/14 0645) BP: (104-185)/(64-130) 141/114 (11/14 0700) SpO2:  [80 %-100 %] 95 % (11/14 0445)  Hemodynamic parameters for last 24 hours:    Intake/Output from previous day: 11/13 0701 - 11/14 0700 In: 1963.3 [I.V.:1963.3] Out: 1620 [Urine:1620]  Intake/Output this shift: No intake/output data recorded.  Vent settings for last 24 hours:    Physical Exam:  General: alert and no respiratory distress Neuro: alert and oriented Resp: clear to auscultation bilaterally and tender L ribs CVS: RRR GI: soft, NT Extremities: calves soft  Results for orders  placed or performed during the hospital encounter of 12/31/23 (from the past 24 hours)  CBC     Status: Abnormal   Collection Time: 01/01/24  1:42 PM  Result Value Ref Range   WBC 8.5 4.0 - 10.5 K/uL   RBC 3.54 (L) 4.22 - 5.81 MIL/uL   Hemoglobin 10.8 (L) 13.0 - 17.0 g/dL   HCT 67.2 (L) 60.9 - 47.9 %   MCV 92.4 80.0 - 100.0 fL   MCH 30.5 26.0 - 34.0 pg   MCHC 33.0 30.0 - 36.0 g/dL   RDW 85.5 88.4 - 84.4 %   Platelets 105 (L) 150 - 400 K/uL   nRBC 0.0 0.0 - 0.2 %  CBC     Status: Abnormal   Collection Time: 01/02/24  3:52 AM  Result Value Ref Range   WBC 8.2 4.0 - 10.5 K/uL   RBC 3.52 (L) 4.22 - 5.81 MIL/uL   Hemoglobin 10.8 (L) 13.0 - 17.0 g/dL   HCT 67.1 (L) 60.9 - 47.9 %   MCV 93.2 80.0 - 100.0 fL   MCH 30.7 26.0 - 34.0 pg   MCHC 32.9 30.0 - 36.0 g/dL   RDW 85.3 88.4 - 84.4 %   Platelets 106 (L) 150 - 400 K/uL   nRBC 0.0 0.0 - 0.2 %  Basic metabolic panel with GFR     Status: Abnormal   Collection Time: 01/02/24  3:52 AM  Result Value Ref Range   Sodium 135  135 - 145 mmol/L   Potassium 4.0 3.5 - 5.1 mmol/L   Chloride 104 98 - 111 mmol/L   CO2 23 22 - 32 mmol/L   Glucose, Bld 105 (H) 70 - 99 mg/dL   BUN 14 8 - 23 mg/dL   Creatinine, Ser 8.90 0.61 - 1.24 mg/dL   Calcium 8.3 (L) 8.9 - 10.3 mg/dL   GFR, Estimated >39 >39 mL/min   Anion gap 8 5 - 15  Hepatic function panel     Status: Abnormal   Collection Time: 01/02/24  3:52 AM  Result Value Ref Range   Total Protein 6.1 (L) 6.5 - 8.1 g/dL   Albumin 3.0 (L) 3.5 - 5.0 g/dL   AST 62 (H) 15 - 41 U/L   ALT 74 (H) 0 - 44 U/L   Alkaline Phosphatase 94 38 - 126 U/L   Total Bilirubin 1.1 0.0 - 1.2 mg/dL   Bilirubin, Direct 0.3 (H) 0.0 - 0.2 mg/dL   Indirect Bilirubin 0.8 0.3 - 0.9 mg/dL    Assessment & Plan: Present on Admission: **None**    LOS: 2 days   Additional comments:I reviewed the patient's new clinical lab test results. / PHBC  Upper abdomen intra-abdominal hematoma with extrav - IR did not feel it was  amenable to AE. Remains NT and Hb stable. Reg diet. Ambulate. Grade 2 liver laceration Cholelithiasis, choledocholithiasis on CT - asymptomatic, LFTs improving, will consult GI - may just need outpatient F/U Multiple L rib FXs - multimodal pain control and pulmonary toilet, IS Traumatic R renal infarct/AKI - AKI resolved A fib on Eliquis  - reversed with K centra, hold anticoagulation HTN - started lopressor  yesterday, add losartan  today as DBP>100 FEN - reg diet, D/C IVF VTE - PAS Dispo - to progressive, therapies, has a son who is local but works out of town. Critical Care Total Time*: 35 Minutes  Dann Hummer, MD, MPH, FACS Trauma & General Surgery Use [email or web address] to contact on call provider  01/02/2024  *Care during the described time interval was provided by me. I have reviewed this patient's available data, including medical history, events of note, physical examination and test results as part of my evaluation.
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
## 2024-01-03 LAB — BASIC METABOLIC PANEL WITH GFR
Anion gap: 17 — ABNORMAL HIGH (ref 5–15)
BUN: 15 mg/dL (ref 8–23)
CO2: 16 mmol/L — ABNORMAL LOW (ref 22–32)
Calcium: 8.5 mg/dL — ABNORMAL LOW (ref 8.9–10.3)
Chloride: 103 mmol/L (ref 98–111)
Creatinine, Ser: 1.1 mg/dL (ref 0.61–1.24)
GFR, Estimated: >60 mL/min (ref >60)
Glucose, Bld: 81 mg/dL (ref 70–99)
Potassium: 4.8 mmol/L (ref 3.5–5.1)
Sodium: 136 mmol/L (ref 135–145)

## 2024-01-03 LAB — CBC
HCT: 34.1 % — ABNORMAL LOW (ref 39.0–52.0)
Hemoglobin: 10.9 g/dL — ABNORMAL LOW (ref 13.0–17.0)
MCH: 30.2 pg (ref 26.0–34.0)
MCHC: 32 g/dL (ref 30.0–36.0)
MCV: 94.5 fL (ref 80.0–100.0)
Platelets: 107 K/uL — ABNORMAL LOW (ref 150–400)
RBC: 3.61 MIL/uL — ABNORMAL LOW (ref 4.22–5.81)
RDW: 14.6 % (ref 11.5–15.5)
WBC: 8.9 K/uL (ref 4.0–10.5)
nRBC: 0 % (ref 0.0–0.2)

## 2024-01-03 MED ORDER — METOPROLOL TARTRATE 50 MG PO TABS
50.0000 mg | ORAL_TABLET | Freq: Two times a day (BID) | ORAL | Status: DC
  Administered 2024-01-03 – 2024-01-06 (×6): 50 mg via ORAL
  Filled 2024-01-03 (×6): qty 1

## 2024-01-03 MED ORDER — POLYETHYLENE GLYCOL 3350 17 G PO PACK
17.0000 g | PACK | Freq: Every day | ORAL | Status: DC
  Administered 2024-01-03 – 2024-01-04 (×2): 17 g via ORAL
  Filled 2024-01-03 (×2): qty 1
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
## 2024-01-04 LAB — CBC
HCT: 31.7 % — ABNORMAL LOW (ref 39.0–52.0)
Hemoglobin: 10.2 g/dL — ABNORMAL LOW (ref 13.0–17.0)
MCH: 30.6 pg (ref 26.0–34.0)
MCHC: 32.2 g/dL (ref 30.0–36.0)
MCV: 95.2 fL (ref 80.0–100.0)
Platelets: 118 K/uL — ABNORMAL LOW (ref 150–400)
RBC: 3.33 MIL/uL — ABNORMAL LOW (ref 4.22–5.81)
RDW: 14.4 % (ref 11.5–15.5)
WBC: 6.9 K/uL (ref 4.0–10.5)
nRBC: 0 % (ref 0.0–0.2)

## 2024-01-04 LAB — BASIC METABOLIC PANEL WITH GFR
Anion gap: 11 (ref 5–15)
BUN: 15 mg/dL (ref 8–23)
CO2: 22 mmol/L (ref 22–32)
Calcium: 8.3 mg/dL — ABNORMAL LOW (ref 8.9–10.3)
Chloride: 103 mmol/L (ref 98–111)
Creatinine, Ser: 1.06 mg/dL (ref 0.61–1.24)
GFR, Estimated: >60 mL/min (ref >60)
Glucose, Bld: 99 mg/dL (ref 70–99)
Potassium: 3.8 mmol/L (ref 3.5–5.1)
Sodium: 136 mmol/L (ref 135–145)

## 2024-01-04 MED ORDER — ENOXAPARIN SODIUM 40 MG/0.4ML IJ SOSY
40.0000 mg | PREFILLED_SYRINGE | Freq: Two times a day (BID) | INTRAMUSCULAR | Status: DC
  Administered 2024-01-04 – 2024-01-05 (×4): 40 mg via SUBCUTANEOUS
  Filled 2024-01-04 (×4): qty 0.4

## 2024-01-04 MED ORDER — POLYETHYLENE GLYCOL 3350 17 G PO PACK
17.0000 g | PACK | Freq: Two times a day (BID) | ORAL | Status: DC
  Administered 2024-01-04 – 2024-01-06 (×4): 17 g via ORAL
  Filled 2024-01-04 (×4): qty 1

## 2024-01-04 MED ORDER — LIDOCAINE 5 % EX PTCH
2.0000 | MEDICATED_PATCH | CUTANEOUS | Status: DC
  Administered 2024-01-04 – 2024-01-06 (×2): 2 via TRANSDERMAL
  Filled 2024-01-04 (×3): qty 2

## 2024-01-04 NOTE — Progress Notes (Signed)
 Progress Note     Subjective: Pt reports poor pain control from rib fractures. BP significantly elevated, suspect some of this is secondary to pain. Still deciding if he would want to proceed with ERCP inpatient.   Objective: Vital signs in last 24 hours: Temp:  [97.7 F (36.5 C)-98.4 F (36.9 C)] 98.3 F (36.8 C) (11/16 0849) Pulse Rate:  [76-89] 76 (11/16 0849) Resp:  [17-20] 20 (11/16 0849) BP: (117-196)/(97-140) 196/117 (11/16 0849) SpO2:  [99 %-100 %] 100 % (11/16 0849) Last BM Date : 01/01/24  Intake/Output from previous day: 11/15 0701 - 11/16 0700 In: -  Out: 1150 [Urine:1150] Intake/Output this shift: No intake/output data recorded.  PE: General: pleasant, WD, thin male who is laying in bed in NAD HEENT: sclera anicteric Heart: regular, rate, and rhythm.  Lungs: Respiratory effort nonlabored Abd: soft, mild TTP in LUQ, ND Psych: A&Ox3 with an appropriate affect.    Lab Results:  Recent Labs    01/03/24 0311 01/04/24 0334  WBC 8.9 6.9  HGB 10.9* 10.2*  HCT 34.1* 31.7*  PLT 107* 118*   BMET Recent Labs    01/03/24 0311 01/04/24 0334  NA 136 136  K 4.8 3.8  CL 103 103  CO2 16* 22  GLUCOSE 81 99  BUN 15 15  CREATININE 1.10 1.06  CALCIUM 8.5* 8.3*   PT/INR No results for input(s): LABPROT, INR in the last 72 hours. CMP     Component Value Date/Time   NA 136 01/04/2024 0334   NA 145 (H) 07/11/2022 1154   K 3.8 01/04/2024 0334   CL 103 01/04/2024 0334   CO2 22 01/04/2024 0334   GLUCOSE 99 01/04/2024 0334   BUN 15 01/04/2024 0334   BUN 16 07/11/2022 1154   CREATININE 1.06 01/04/2024 0334   CREATININE 1.31 (H) 09/11/2015 1021   CALCIUM 8.3 (L) 01/04/2024 0334   PROT 6.1 (L) 01/02/2024 0352   PROT 6.4 02/04/2022 1448   ALBUMIN 3.0 (L) 01/02/2024 0352   ALBUMIN 3.6 (L) 02/04/2022 1448   AST 62 (H) 01/02/2024 0352   ALT 74 (H) 01/02/2024 0352   ALKPHOS 94 01/02/2024 0352   BILITOT 1.1 01/02/2024 0352   BILITOT 0.5 02/04/2022 1448    GFRNONAA >60 01/04/2024 0334   GFRAA 58 (L) 03/22/2019 1620   Lipase  No results found for: LIPASE     Studies/Results: No results found.   Anti-infectives: Anti-infectives (From admission, onward)    None        Assessment/Plan PHBC   Upper abdomen intra-abdominal hematoma with extrav - IR did not feel it was amenable to AE. Remains NT and Hb stable. Reg diet. Ambulate. Grade 2 liver laceration Cholelithiasis, choledocholithiasis on CT - asymptomatic, LFTs improving, GI consulted and considering ERCP non-emergently while inpatient vs setting up as an outpatient  Multiple L rib FXs - multimodal pain control and pulmonary toilet, IS Traumatic R renal infarct/AKI - AKI resolved A fib on Eliquis  - reversed with K centra, hold anticoagulation HTN - started lopressor  11/13, losartan  added 11/14 improving some - improving will increase lopressor  today   FEN - reg diet VTE - PAS, will discuss LMWH vs when to resume anticoagulation with MD - would not want to resume if GI planning ERCP while admitted either  ID - no current abx Dispo - 4E, continue therapies. GI to circle back tomorrow. Encouraged patient to utilize PO pain control more today.     LOS: 4 days   I reviewed  Consultant GI notes, last 24 h vitals and pain scores, last 48 h intake and output, last 24 h labs and trends, and last 24 h imaging results.  This care required moderate level of medical decision making.    Burnard JONELLE Louder, [facility] 01/04/2024, 10:43 AM Please see Amion for pager number during day hours 7:00am-4:30pm

## 2024-01-04 NOTE — Plan of Care (Signed)

## 2024-01-05 DIAGNOSIS — K8050 Calculus of bile duct without cholangitis or cholecystitis without obstruction: Secondary | ICD-10-CM | POA: Diagnosis not present

## 2024-01-05 DIAGNOSIS — K8070 Calculus of gallbladder and bile duct without cholecystitis without obstruction: Secondary | ICD-10-CM

## 2024-01-05 DIAGNOSIS — S36113A Laceration of liver, unspecified degree, initial encounter: Secondary | ICD-10-CM | POA: Diagnosis not present

## 2024-01-05 LAB — CBC
HCT: 31.3 % — ABNORMAL LOW (ref 39.0–52.0)
Hemoglobin: 10.6 g/dL — ABNORMAL LOW (ref 13.0–17.0)
MCH: 30.7 pg (ref 26.0–34.0)
MCHC: 33.9 g/dL (ref 30.0–36.0)
MCV: 90.7 fL (ref 80.0–100.0)
Platelets: 114 K/uL — ABNORMAL LOW (ref 150–400)
RBC: 3.45 MIL/uL — ABNORMAL LOW (ref 4.22–5.81)
RDW: 14.2 % (ref 11.5–15.5)
WBC: 7.2 K/uL (ref 4.0–10.5)
nRBC: 0 % (ref 0.0–0.2)

## 2024-01-05 MED ORDER — METHOCARBAMOL 500 MG PO TABS
1000.0000 mg | ORAL_TABLET | Freq: Four times a day (QID) | ORAL | Status: DC
  Administered 2024-01-05 – 2024-01-06 (×2): 1000 mg via ORAL
  Filled 2024-01-05 (×4): qty 2

## 2024-01-05 MED ORDER — SODIUM CHLORIDE 0.9 % IV SOLN: INTRAVENOUS | Status: AC
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
## 2024-01-05 NOTE — Plan of Care (Signed)
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
## 2024-01-06 ENCOUNTER — Other Ambulatory Visit (HOSPITAL_COMMUNITY): Payer: Self-pay

## 2024-01-06 MED ORDER — ACETAMINOPHEN 500 MG PO TABS
1000.0000 mg | ORAL_TABLET | Freq: Four times a day (QID) | ORAL | 0 refills | Status: AC
  Filled 2024-01-06: qty 30, 4d supply, fill #0

## 2024-01-06 MED ORDER — METHOCARBAMOL 750 MG PO TABS
750.0000 mg | ORAL_TABLET | Freq: Four times a day (QID) | ORAL | 0 refills | Status: AC | PRN
  Filled 2024-01-06: qty 40, 10d supply, fill #0

## 2024-01-06 MED ORDER — METOPROLOL TARTRATE 50 MG PO TABS
50.0000 mg | ORAL_TABLET | Freq: Two times a day (BID) | ORAL | 1 refills | Status: AC
  Filled 2024-01-06: qty 60, 30d supply, fill #0

## 2024-01-06 MED ORDER — LOSARTAN POTASSIUM 25 MG PO TABS
25.0000 mg | ORAL_TABLET | Freq: Every day | ORAL | 1 refills | Status: AC
  Filled 2024-01-06: qty 30, 30d supply, fill #0

## 2024-01-06 MED ORDER — OXYCODONE HCL 5 MG PO TABS
5.0000 mg | ORAL_TABLET | Freq: Four times a day (QID) | ORAL | 0 refills | Status: AC | PRN
  Filled 2024-01-06: qty 25, 6d supply, fill #0

## 2024-01-06 MED ORDER — DOCUSATE SODIUM 100 MG PO CAPS
100.0000 mg | ORAL_CAPSULE | Freq: Two times a day (BID) | ORAL | 0 refills | Status: AC
  Filled 2024-01-06: qty 10, 5d supply, fill #0

## 2024-01-06 MED ORDER — POLYETHYLENE GLYCOL 3350 17 GM/SCOOP PO POWD
17.0000 g | Freq: Two times a day (BID) | ORAL | 0 refills | Status: AC
  Filled 2024-01-06: qty 238, 7d supply, fill #0

## 2024-01-06 NOTE — Care Management Important Message (Signed)
 Important Message  Patient Details  Name: Jeffrey Martin MRN: [id number] Date of Birth: Mar 24, 1950   Important Message Given:  Yes - Medicare IM     Vonzell Arrie Sharps 01/06/2024, 12:14 PM
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
## 2024-01-06 NOTE — Progress Notes (Signed)
 Patient has received d/c instructions from d/c RN; this RN reinforced his d/c meds and follow up, PIV removed, CCMD notified.  Patient's son to transport him home, informed to volunteer service to pick up his TOC meds.

## 2024-01-06 NOTE — TOC Transition Note (Signed)
 Transition of Care [facility]) - Discharge Note   Patient Details  Name: Jeffrey Martin MRN: [id number] Date of Birth: September 04, 1950  Transition of Care [facility]) CM/SW Contact:  Jacy Howat M, RN Phone Number: 01/06/2024, 10:23 AM   Clinical Narrative:    Patient medically stable for discharge home today with intermittent assistance from friends/family.  PT recommending HH follow up, but unable to secure [facility] services due to [facility] and liability concerns.  Referral to [facility] on [facility] patient states his neighbor may be able to assist with transportation. Patient declined rollator due to high copay; will request rolling walker for home use.  [facility] will deliver RW to his home, per his request.    Final next level of care: OP Rehab Barriers to Discharge: Barriers Resolved   Patient Goals and CMS Choice   CMS Medicare.gov Compare Post Acute Care list provided to:: Patient Choice offered to / list presented to : Patient                            Discharge Plan and Services Additional resources added to the After Visit Summary for     Discharge Planning Services: CM Consult            DME Arranged: Vannie rolling DME Agency: [facility] Date DME Agency Contacted: 01/06/24 Time DME Agency Contacted: 1023 Representative spoke with at DME Agency: Thomasina [place]            Social Drivers of Health (SDOH) Interventions SDOH Screenings   Food Insecurity: No Food Insecurity (01/01/2024)  Housing: High Risk (01/01/2024)  Transportation Needs: Unmet Transportation Needs (01/01/2024)  Utilities: Not At Risk (01/01/2024)  Depression (PHQ2-9): Low Risk  (07/11/2022)  Social Connections: Patient Declined (01/02/2024)  Tobacco Use: High Risk (12/31/2023)     Readmission Risk Interventions     No data to display         Mliss MICAEL Fass, RN, BSN  Trauma/Neuro ICU Case Manager [phone]

## 2024-01-07 ENCOUNTER — Telehealth: Payer: Self-pay

## 2024-01-07 NOTE — Telephone Encounter (Signed)
 130 pm 02/03/24 with colleen

## 2024-01-07 NOTE — Transitions of Care (Post Inpatient/ED Visit) (Signed)
   01/07/2024  Name: Jeffrey Martin MRN: [id number] DOB: Dec 27, 1950  Today's TOC FU Call Status: Today's TOC FU Call Status:: Unsuccessful Call (2nd Attempt) Unsuccessful Call (1st Attempt) Date: 01/07/24 Unsuccessful Call (2nd Attempt) Date: 01/07/24  Attempted to reach the patient regarding the most recent Inpatient/ED visit.  Follow Up Plan: Additional outreach attempts will be made to reach the patient to complete the Transitions of Care (Post Inpatient/ED visit) call.   Signature  Slater Diesel, RN
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
## 2024-01-07 NOTE — Transitions of Care (Post Inpatient/ED Visit) (Signed)
   01/07/2024  Name: Jeffrey Martin MRN: [id number] DOB: 01-09-1951  Today's TOC FU Call Status: Today's TOC FU Call Status:: Unsuccessful Call (1st Attempt) Unsuccessful Call (1st Attempt) Date: 01/07/24  Attempted to reach the patient regarding the most recent Inpatient/ED visit.  Follow Up Plan: Additional outreach attempts will be made to reach the patient to complete the Transitions of Care (Post Inpatient/ED visit) call.   The patient answered the phone and he was speaking with someone else and asked that I call back.   Signature  Slater Diesel, RN

## 2024-01-07 NOTE — Telephone Encounter (Signed)
 Patient has been scheduled for a follow up appointment post hospitalization with Elida Nyle Sharps, NP on 02/03/24 at 130 pm.

## 2024-01-08 ENCOUNTER — Other Ambulatory Visit: Payer: Self-pay

## 2024-01-08 ENCOUNTER — Telehealth: Payer: Self-pay

## 2024-01-08 DIAGNOSIS — K8050 Calculus of bile duct without cholangitis or cholecystitis without obstruction: Secondary | ICD-10-CM

## 2024-01-08 NOTE — Transitions of Care (Post Inpatient/ED Visit) (Signed)
   01/08/2024  Name: Jeffrey Martin MRN: [id number] DOB: Jun 18, 1950  Today's TOC FU Call Status: Today's TOC FU Call Status:: Unsuccessful Call (3rd Attempt) Unsuccessful Call (1st Attempt) Date: 01/07/24 Unsuccessful Call (2nd Attempt) Date: 01/07/24 Unsuccessful Call (3rd Attempt) Date: 01/08/24  Attempted to reach the patient regarding the most recent Inpatient/ED visit.  Follow Up Plan: No further outreach attempts will be made at this time. We have been unable to contact the patient.  Letter sent to patient requesting he contact this clinic to schedule a follow up appointment as we have not been able to reach him   Signature Slater Diesel, RN

## 2024-01-08 NOTE — Telephone Encounter (Signed)
 ERCP has been entered for 03/11/24 at 8 am at [facility] with GM    Left message on machine to call back

## 2024-01-09 NOTE — Telephone Encounter (Signed)
 Left message on machine to call back

## 2024-01-12 NOTE — Telephone Encounter (Signed)
 Left message on machine to call back

## 2024-01-13 NOTE — Telephone Encounter (Signed)
 Unable to reach pt by phone all information has been mailed.

## 2024-01-20 NOTE — Telephone Encounter (Signed)
 I tried to reach the patient again to schedule a follow up appointment and had to leave another message requesting a call back.

## 2024-02-03 ENCOUNTER — Ambulatory Visit: Admitting: Nurse Practitioner

## 2024-02-07 ENCOUNTER — Encounter: Payer: Self-pay | Admitting: Gastroenterology

## 2024-02-07 NOTE — Progress Notes (Signed)
 Review of upcoming chart for procedures in January. He was not able to be reached at time or in aftertime from his hospitalization by phone or other communication. Unfortunately, he also no showed for visit on 12/16 and did not call to reschedule that clinic visit with GI [place]. At this point, his planned January outpatient ERCP is going to need to be cancelled, so that other individuals needing Advanced Endoscopy time may use that spot as necessary. Will update the referring providers to this change and he will be removed from ERCP in January. If he is seen in followup by his PCP in the future and he has changed his mind in regards to being seen for his asymptomatic choledocholithiasis, then he can be rescheduled for GI clinic evaluation.  Aloha Finner, MD [facility] Advanced Endoscopy Office # [phone]

## 2024-02-09 NOTE — Progress Notes (Signed)
 Case has been cancelled per order

## 2024-03-11 ENCOUNTER — Encounter (HOSPITAL_COMMUNITY): Payer: Self-pay

## 2024-03-11 ENCOUNTER — Ambulatory Visit (HOSPITAL_COMMUNITY): Admit: 2024-03-11 | Admitting: Gastroenterology

## 2024-03-11 SURGERY — ERCP, WITH INTERVENTION IF INDICATED
Anesthesia: General
